# Patient Record
Sex: Female | Born: 1945 | Race: White | Hispanic: No | Marital: Married | Smoking: Never smoker
Health system: Southern US, Community
[De-identification: ages and names within clinical notes are randomized; demographics above are authoritative.]

## PROBLEM LIST (undated history)

## (undated) DIAGNOSIS — J189 Pneumonia, unspecified organism: Secondary | ICD-10-CM

## (undated) DIAGNOSIS — T4145XA Adverse effect of unspecified anesthetic, initial encounter: Secondary | ICD-10-CM

## (undated) DIAGNOSIS — Z8719 Personal history of other diseases of the digestive system: Secondary | ICD-10-CM

## (undated) DIAGNOSIS — E785 Hyperlipidemia, unspecified: Secondary | ICD-10-CM

## (undated) DIAGNOSIS — I10 Essential (primary) hypertension: Secondary | ICD-10-CM

## (undated) DIAGNOSIS — Z8489 Family history of other specified conditions: Secondary | ICD-10-CM

## (undated) DIAGNOSIS — M1990 Unspecified osteoarthritis, unspecified site: Secondary | ICD-10-CM

## (undated) DIAGNOSIS — H3530 Unspecified macular degeneration: Secondary | ICD-10-CM

## (undated) DIAGNOSIS — E119 Type 2 diabetes mellitus without complications: Secondary | ICD-10-CM

## (undated) DIAGNOSIS — J45909 Unspecified asthma, uncomplicated: Secondary | ICD-10-CM

## (undated) DIAGNOSIS — E039 Hypothyroidism, unspecified: Secondary | ICD-10-CM

## (undated) DIAGNOSIS — D649 Anemia, unspecified: Secondary | ICD-10-CM

## (undated) DIAGNOSIS — T8859XA Other complications of anesthesia, initial encounter: Secondary | ICD-10-CM

## (undated) DIAGNOSIS — G4730 Sleep apnea, unspecified: Secondary | ICD-10-CM

## (undated) DIAGNOSIS — R011 Cardiac murmur, unspecified: Secondary | ICD-10-CM

## (undated) HISTORY — PX: DILATION AND CURETTAGE OF UTERUS: SHX78

## (undated) HISTORY — PX: JOINT REPLACEMENT: SHX530

## (undated) HISTORY — PX: OTHER SURGICAL HISTORY: SHX169

## (undated) HISTORY — PX: PARTIAL HYSTERECTOMY: SHX80

## (undated) HISTORY — PX: EYE SURGERY: SHX253

## (undated) HISTORY — PX: TONSILLECTOMY: SUR1361

## (undated) HISTORY — PX: THYROIDECTOMY: SHX17

## (undated) HISTORY — PX: CHOLECYSTECTOMY: SHX55

## (undated) HISTORY — PX: CERVICAL SPINE SURGERY: SHX589

## (undated) HISTORY — PX: ABDOMINAL HYSTERECTOMY: SHX81

## (undated) HISTORY — PX: SHOULDER ARTHROSCOPY WITH OPEN ROTATOR CUFF REPAIR: SHX6092

## (undated) HISTORY — PX: KNEE CARTILAGE SURGERY: SHX688

---
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
## 1979-10-03 DIAGNOSIS — I739 Peripheral vascular disease, unspecified: Secondary | ICD-10-CM

## 1979-10-03 HISTORY — DX: Peripheral vascular disease, unspecified: I73.9

## 2003-10-03 DIAGNOSIS — E119 Type 2 diabetes mellitus without complications: Secondary | ICD-10-CM

## 2003-10-03 HISTORY — DX: Type 2 diabetes mellitus without complications: E11.9

## 2012-11-16 ENCOUNTER — Emergency Department
Admission: EM | Admit: 2012-11-16 | Discharge: 2012-11-16 | Disposition: A | Discharge status: Home / Self Care | Payer: MEDICARE | Source: Home / Self Care | Attending: Family Medicine | Admitting: Family Medicine

## 2012-11-16 ENCOUNTER — Encounter: Payer: Self-pay | Admitting: *Deleted

## 2012-11-16 DIAGNOSIS — J45909 Unspecified asthma, uncomplicated: Secondary | ICD-10-CM

## 2012-11-16 DIAGNOSIS — J069 Acute upper respiratory infection, unspecified: Secondary | ICD-10-CM

## 2012-11-16 HISTORY — DX: Hyperlipidemia, unspecified: E78.5

## 2012-11-16 HISTORY — DX: Essential (primary) hypertension: I10

## 2012-11-16 HISTORY — DX: Type 2 diabetes mellitus without complications: E11.9

## 2012-11-16 MED ORDER — ALBUTEROL SULFATE HFA 108 (90 BASE) MCG/ACT IN AERS: 2.0000 | INHALATION_SPRAY | RESPIRATORY_TRACT | Status: DC | PRN

## 2012-11-16 MED ORDER — BENZONATATE 200 MG PO CAPS: 200.0000 mg | ORAL_CAPSULE | Freq: Every day | ORAL | Status: DC

## 2012-11-16 MED ORDER — DOXYCYCLINE HYCLATE 100 MG PO CAPS: 100.0000 mg | ORAL_CAPSULE | Freq: Two times a day (BID) | ORAL | Status: DC

## 2012-11-16 MED ORDER — PREDNISONE 20 MG PO TABS: 20.0000 mg | ORAL_TABLET | Freq: Two times a day (BID) | ORAL | Status: DC

## 2012-11-16 NOTE — ED Provider Notes (Signed)
History     CSN: [id number]  Arrival date & time 11/16/12  1238   First MD Initiated Contact with Patient 11/16/12 1413      Chief Complaint  Patient presents with  . Cough       HPI Comments: Patient complains of one day history of non-productive cough, hoarseness, mild myalgias, and tightness in her anterior chest.  No fevers, chills, and sweats but she has felt hot.  She has a history of asthma.  The history is provided by the patient.    Past Medical History  Diagnosis Date  . Diabetes mellitus without complication   . Hypertension   . Hyperlipemia     Past Surgical History  Procedure Laterality Date  . Hystersalpingogram    . Dilation and curettage of uterus    . Partial hysterectomy    . Cholecystectomy      Family History  Problem Relation Age of Onset  . Diabetes Mother   . Cancer Mother   . Cancer Father   . Cancer Sister   . Diabetes Brother   . Cancer Brother     History  Substance Use Topics  . Smoking status: Never Smoker   . Smokeless tobacco: Not on file  . Alcohol Use: No    OB History   Grav Para Term Preterm Abortions TAB SAB Ect Mult Living                  Review of Systems No sore throat + cough No pleuritic pain No wheezing + nasal congestion + post-nasal drainage No sinus pain/pressure No itchy/red eyes No earache No hemoptysis No SOB No fever/chills but felt hot No nausea No vomiting No abdominal pain No diarrhea No urinary symptoms No skin rashes + fatigue + myalgias No headache Used OTC meds without relief  Allergies  Penicillins; Percodan; and Sulfa antibiotics  Home Medications   Current Outpatient Rx  Name  Route  Sig  Dispense  Refill  . aspirin 81 MG tablet   Oral   Take 81 mg by mouth daily.         . budesonide-formoterol (SYMBICORT) 80-4.5 MCG/ACT inhaler   Inhalation   Inhale 2 puffs into the lungs 2 (two) times daily.         . calcium-vitamin D 250-100 MG-UNIT per tablet   Oral    Take 1 tablet by mouth 2 (two) times daily.         . fenofibrate (TRICOR) 145 MG tablet   Oral   Take 145 mg by mouth daily.         . fish oil-omega-3 fatty acids 1000 MG capsule   Oral   Take 2 g by mouth daily.         . Flaxseed, Linseed, (FLAX SEED OIL) 1000 MG CAPS   Oral   Take by mouth.         . fluticasone (FLONASE) 50 MCG/ACT nasal spray   Nasal   Place 2 sprays into the nose daily.         Marland Kitchen levothyroxine (SYNTHROID, LEVOTHROID) 50 MCG tablet   Oral   Take 50 mcg by mouth daily.         . Liraglutide (VICTOZA) 18 MG/3ML SOLN injection   Subcutaneous   Inject 1.8 mg into the skin.         Marland Kitchen losartan-hydrochlorothiazide (HYZAAR) 100-25 MG per tablet   Oral   Take 1 tablet by mouth daily.         Marland Kitchen  metFORMIN (GLUCOPHAGE) 500 MG tablet   Oral   Take 500 mg by mouth 2 (two) times daily with a meal.         . montelukast (SINGULAIR) 10 MG tablet   Oral   Take 10 mg by mouth at bedtime.         . Multiple Vitamins-Minerals (CENTRUM SILVER PO)   Oral   Take by mouth.         . naproxen sodium (ANAPROX) 220 MG tablet   Oral   Take 220 mg by mouth 2 (two) times daily with a meal.         . vitamin C (ASCORBIC ACID) 500 MG tablet   Oral   Take 500 mg by mouth daily.         Marland Kitchen albuterol (PROVENTIL HFA;VENTOLIN HFA) 108 (90 BASE) MCG/ACT inhaler   Inhalation   Inhale 2 puffs into the lungs every 4 (four) hours as needed for wheezing.   1 Inhaler   0   . benzonatate (TESSALON) 200 MG capsule   Oral   Take 1 capsule (200 mg total) by mouth at bedtime. Take as needed for cough   12 capsule   0   . doxycycline (VIBRAMYCIN) 100 MG capsule   Oral   Take 1 capsule (100 mg total) by mouth 2 (two) times daily. (Rx void after 11/24/13)   20 capsule   0   . predniSONE (DELTASONE) 20 MG tablet   Oral   Take 1 tablet (20 mg total) by mouth 2 (two) times daily.   10 tablet   0     BP 155/84  Pulse 81  Temp(Src) 98.4 F (36.9 C)  (Oral)  Ht 5\' 2"  (1.575 m)  Wt 209 lb (94.802 kg)  BMI 38.22 kg/m2  SpO2 96%  Physical Exam Nursing notes and Vital Signs reviewed. Appearance:  Patient appears stated age, and in no acute distress.   Patient is obese (BMI 38.2) Eyes:  Pupils are equal, round, and reactive to light and accomodation.  Extraocular movement is intact.  Conjunctivae are not inflamed  Ears:  Canals normal.  Tympanic membranes normal.  Nose:  Mildly congested turbinates.  No sinus tenderness.   Pharynx:  Normal Neck:  Supple.  Slightly tender shotty posterior nodes are palpated bilaterally  Lungs:  Clear to auscultation.  Breath sounds are equal. Chest:  Distinct tenderness to palpation over the mid-sternum.   Heart:  Regular rate and rhythm without murmurs, rubs, or gallops.  Abdomen:  Nontender without masses or hepatosplenomegaly.  Bowel sounds are present.  No CVA or flank tenderness.  Extremities:  No edema.  No calf tenderness Skin:  No rash present.   ED Course  Procedures  none      1. Asthma, chronic   2. Acute upper respiratory infections of unspecified site; suspect early viral URI       MDM  There is no evidence of bacterial infection today.   Begin prednisone burst.  Rx for albuterol inhaler given.  Prescription written for Benzonatate [facility]) to take at bedtime for night-time cough.  Take plain Mucinex (guaifenesin) twice daily for cough and congestion.  Increase fluid intake, rest. May use Afrin nasal spray (or generic oxymetazoline) twice daily for about 5 days.  Also recommend using saline nasal spray several times daily and saline nasal irrigation (AYR is a common brand) Stop all antihistamines for now, and other non-prescription cough/cold preparations. Continue all inhalers Begin Doxycycline if not improving  about 5 days or if persistent fever develops (Given a prescription to hold, with an expiration date)  Follow-up with family doctor if not improving 7 to 10 days.          Lattie Haw, MD 11/16/12 [phone]

## 2012-11-16 NOTE — ED Notes (Signed)
Pt developed a dry cough, hoarse voice, chest congestion starting last night.

## 2013-09-01 ENCOUNTER — Emergency Department
Admission: EM | Admit: 2013-09-01 | Discharge: 2013-09-01 | Disposition: A | Discharge status: Home / Self Care | Payer: MEDICARE | Source: Home / Self Care | Attending: Family Medicine | Admitting: Family Medicine

## 2013-09-01 ENCOUNTER — Encounter: Payer: Self-pay | Admitting: Emergency Medicine

## 2013-09-01 DIAGNOSIS — R112 Nausea with vomiting, unspecified: Secondary | ICD-10-CM

## 2013-09-01 DIAGNOSIS — R197 Diarrhea, unspecified: Secondary | ICD-10-CM

## 2013-09-01 MED ORDER — ONDANSETRON 4 MG PO TBDP: 4.0000 mg | ORAL_TABLET | Freq: Three times a day (TID) | ORAL | Status: DC | PRN

## 2013-09-01 NOTE — ED Provider Notes (Signed)
CSN: [id number]     Arrival date & time 09/01/13  1853 History   First MD Initiated Contact with Patient 09/01/13 1925     Chief Complaint  Patient presents with  . Diarrhea  . Emesis      HPI Comments: Patient awoke at 6:30AM today with fever/fatigue/myalgias and diarrhea.  She has had nausea and two episodes of vomiting.  No abdominal pain.  She is diabetic and her CBG earlier was 175.  Her husband has similar symptoms.  Denies recent foreign travel, or drinking untreated water in a wilderness environment.   Patient is a 67 y.o. female presenting with diarrhea. The history is provided by the patient and the spouse.  Diarrhea Quality:  Watery Severity:  Moderate Onset quality:  Sudden Duration:  12 hours Timing:  Intermittent Progression:  Unchanged Relieved by:  Nothing Worsened by:  Nothing tried Ineffective treatments:  None tried Associated symptoms: chills, fever, headaches, myalgias and vomiting   Associated symptoms: no abdominal pain, no arthralgias, no recent cough, no diaphoresis and no URI   Risk factors: sick contacts   Risk factors: no recent antibiotic use, no suspicious food intake and no travel to endemic areas     Past Medical History  Diagnosis Date  . Diabetes mellitus without complication   . Hypertension   . Hyperlipemia    Past Surgical History  Procedure Laterality Date  . Hystersalpingogram    . Dilation and curettage of uterus    . Partial hysterectomy    . Cholecystectomy     Family History  Problem Relation Age of Onset  . Diabetes Mother   . Cancer Mother   . Cancer Father   . Cancer Sister   . Diabetes Brother   . Cancer Brother    History  Substance Use Topics  . Smoking status: Never Smoker   . Smokeless tobacco: Never Used  . Alcohol Use: No   OB History   Grav Para Term Preterm Abortions TAB SAB Ect Mult Living                 Review of Systems  Constitutional: Positive for fever and chills. Negative for diaphoresis.   Gastrointestinal: Positive for vomiting and diarrhea. Negative for abdominal pain.  Musculoskeletal: Positive for myalgias. Negative for arthralgias.  Neurological: Positive for headaches.  All other systems reviewed and are negative.    Allergies  Penicillins; Percodan; and Sulfa antibiotics  Home Medications   Current Outpatient Rx  Name  Route  Sig  Dispense  Refill  . albuterol (PROVENTIL HFA;VENTOLIN HFA) 108 (90 BASE) MCG/ACT inhaler   Inhalation   Inhale 2 puffs into the lungs every 4 (four) hours as needed for wheezing.   1 Inhaler   0   . aspirin 81 MG tablet   Oral   Take 81 mg by mouth daily.         . benzonatate (TESSALON) 200 MG capsule   Oral   Take 1 capsule (200 mg total) by mouth at bedtime. Take as needed for cough   12 capsule   0   . budesonide-formoterol (SYMBICORT) 80-4.5 MCG/ACT inhaler   Inhalation   Inhale 2 puffs into the lungs 2 (two) times daily.         . calcium-vitamin D 250-100 MG-UNIT per tablet   Oral   Take 1 tablet by mouth 2 (two) times daily.         Marland Kitchen doxycycline (VIBRAMYCIN) 100 MG capsule   Oral  Take 1 capsule (100 mg total) by mouth 2 (two) times daily. (Rx void after 11/24/13)   20 capsule   0   . fenofibrate (TRICOR) 145 MG tablet   Oral   Take 145 mg by mouth daily.         . fish oil-omega-3 fatty acids 1000 MG capsule   Oral   Take 2 g by mouth daily.         . Flaxseed, Linseed, (FLAX SEED OIL) 1000 MG CAPS   Oral   Take by mouth.         . fluticasone (FLONASE) 50 MCG/ACT nasal spray   Nasal   Place 2 sprays into the nose daily.         Marland Kitchen levothyroxine (SYNTHROID, LEVOTHROID) 50 MCG tablet   Oral   Take 50 mcg by mouth daily.         . Liraglutide (VICTOZA) 18 MG/3ML SOLN injection   Subcutaneous   Inject 1.8 mg into the skin.         Marland Kitchen losartan-hydrochlorothiazide (HYZAAR) 100-25 MG per tablet   Oral   Take 1 tablet by mouth daily.         . metFORMIN (GLUCOPHAGE) 500 MG  tablet   Oral   Take 500 mg by mouth 2 (two) times daily with a meal.         . montelukast (SINGULAIR) 10 MG tablet   Oral   Take 10 mg by mouth at bedtime.         . Multiple Vitamins-Minerals (CENTRUM SILVER PO)   Oral   Take by mouth.         . naproxen sodium (ANAPROX) 220 MG tablet   Oral   Take 220 mg by mouth 2 (two) times daily with a meal.         . ondansetron (ZOFRAN ODT) 4 MG disintegrating tablet   Oral   Take 1 tablet (4 mg total) by mouth every 8 (eight) hours as needed for nausea or vomiting.   12 tablet   0   . predniSONE (DELTASONE) 20 MG tablet   Oral   Take 1 tablet (20 mg total) by mouth 2 (two) times daily.   10 tablet   0   . vitamin C (ASCORBIC ACID) 500 MG tablet   Oral   Take 500 mg by mouth daily.          BP 128/70  Pulse 105  Temp(Src) 101.4 F (38.6 C) (Oral)  Resp 18  Wt 211 lb (95.709 kg)  SpO2 96% Physical Exam Nursing notes and Vital Signs reviewed. Appearance:  Patient appears stated age, and in no acute distress.  She is obese. Eyes:  Pupils are equal, round, and reactive to light and accomodation.  Extraocular movement is intact.  Conjunctivae are not inflamed  Ears:  Canals normal.  Tympanic membranes normal.  Nose:   Normal turbinates.  No sinus tenderness.   Pharynx:  Normal;  moist mucous membranes  Neck:  Supple.   No adenopathy Lungs:  Clear to auscultation.  Breath sounds are equal.  Heart:  Regular rate and rhythm without murmurs, rubs, or gallops.  Abdomen:  Nontender without masses or hepatosplenomegaly.  Bowel sounds are present.  No CVA or flank tenderness.  Extremities:  No edema.  No calf tenderness Skin:  No rash present.   ED Course  Procedures  none    Labs Reviewed      POCT Glucose [facility])  145 (*)              MDM   1. Nausea with vomiting; suspect a viral gastroenteritis   2. Diarrhea    Rx for Zofran. Begin clear liquids (use an adult oral rehydration solution while having  diarrhea) until improved, then advance diet.  Then gradually resume a regular diet when tolerated.  Avoid milk products until well.  To decrease diarrhea, mix one heaping tablespoon Citrucel (methylcellulose) in 8 oz water and drink one to three times daily.  When stools become more formed, may take Imodium (loperamide) once or twice daily to decrease stool frequency.  Check blood glucose more often and adjust medications as necessary. If symptoms become significantly worse during the night or over the weekend, proceed to the local emergency room.     Lattie Haw, MD 09/06/13 [phone]

## 2013-09-01 NOTE — ED Notes (Signed)
Cheryl Finley is here with her husband both with N/V/D. She also has a fever and c/o body aches. She is unable to hold down food and minimal fluids. Symptoms x 14 hours. She is a diabetic. CBG earlier today 175,  Checked in-house, 145.

## 2013-09-07 ENCOUNTER — Telehealth: Payer: Self-pay

## 2013-09-07 NOTE — ED Notes (Signed)
I called and spoke with patient and she is doing better. I advised to call back if anything changes or if she has questions or concerns.  

## 2013-09-18 ENCOUNTER — Emergency Department
Admission: EM | Admit: 2013-09-18 | Discharge: 2013-09-18 | Disposition: A | Discharge status: Home / Self Care | Payer: MEDICARE | Source: Home / Self Care | Attending: Family Medicine | Admitting: Family Medicine

## 2013-09-18 ENCOUNTER — Encounter: Payer: Self-pay | Admitting: Emergency Medicine

## 2013-09-18 DIAGNOSIS — J069 Acute upper respiratory infection, unspecified: Secondary | ICD-10-CM

## 2013-09-18 DIAGNOSIS — J40 Bronchitis, not specified as acute or chronic: Secondary | ICD-10-CM

## 2013-09-18 DIAGNOSIS — R062 Wheezing: Secondary | ICD-10-CM

## 2013-09-18 MED ORDER — AZITHROMYCIN 250 MG PO TABS: ORAL_TABLET | ORAL | Status: DC

## 2013-09-18 MED ORDER — METHYLPREDNISOLONE SODIUM SUCC 125 MG IJ SOLR
125.0000 mg | Freq: Once | INTRAMUSCULAR | Status: AC
  Administered 2013-09-18: 125 mg via INTRAMUSCULAR

## 2013-09-18 MED ORDER — PREDNISONE 50 MG PO TABS: ORAL_TABLET | ORAL | Status: DC

## 2013-09-18 NOTE — ED Notes (Signed)
Mirel c/o dry cough without fever x yesterday. She reports hx of asthmatic bronchitis and sleep apnea with CPAP use. Allergic to flu vac but has had PNA vac.

## 2013-09-18 NOTE — ED Provider Notes (Signed)
CSN: [id number]     Arrival date & time 09/18/13  1308 History   First MD Initiated Contact with Patient 09/18/13 1002     Chief Complaint  Patient presents with  . Cough    HPI  URI Symptoms Onset: 3-4 days  Description: rhinorrhea, cough, wheezing  Modifying factors:  Baseline hx/o asthma. Out of town from [place] to see newborn grandchild. Born @ 33 weeks. Came out NICU this week. Pt wants to get ahead of potential bronchitic flare.   Symptoms Nasal discharge: yes Fever: no Sore throat: no Cough: yes Wheezing: yes Ear pain: no GI symptoms: no Sick contacts: yes; multiple  Red Flags  Stiff neck: no Dyspnea: no Rash: no Swallowing difficulty: no  Sinusitis Risk Factors Headache/face pain: no Double sickening: no tooth pain: no  Allergy Risk Factors Sneezing: no Itchy scratchy throat: no Seasonal symptoms: no  Flu Risk Factors Headache: no muscle aches: no severe fatigue: no   Past Medical History  Diagnosis Date  . Diabetes mellitus without complication   . Hypertension   . Hyperlipemia    Past Surgical History  Procedure Laterality Date  . Hystersalpingogram    . Dilation and curettage of uterus    . Partial hysterectomy    . Cholecystectomy     Family History  Problem Relation Age of Onset  . Diabetes Mother   . Cancer Mother   . Cancer Father   . Cancer Sister   . Diabetes Brother   . Cancer Brother    History  Substance Use Topics  . Smoking status: Never Smoker   . Smokeless tobacco: Never Used  . Alcohol Use: No   OB History   Grav Para Term Preterm Abortions TAB SAB Ect Mult Living                 Review of Systems  All other systems reviewed and are negative.    Allergies  Penicillins; Percodan; and Sulfa antibiotics  Home Medications   Current Outpatient Rx  Name  Route  Sig  Dispense  Refill  . aspirin 81 MG tablet   Oral   Take 81 mg by mouth daily.         Marland Kitchen azithromycin (ZITHROMAX) 250 MG tablet      Take 2  tabs PO x 1 dose, then 1 tab PO QD x 4 days   6 tablet   0   . budesonide-formoterol (SYMBICORT) 80-4.5 MCG/ACT inhaler   Inhalation   Inhale 2 puffs into the lungs 2 (two) times daily.         . calcium-vitamin D 250-100 MG-UNIT per tablet   Oral   Take 1 tablet by mouth 2 (two) times daily.         . fenofibrate (TRICOR) 145 MG tablet   Oral   Take 145 mg by mouth daily.         . fish oil-omega-3 fatty acids 1000 MG capsule   Oral   Take 2 g by mouth daily.         . Flaxseed, Linseed, (FLAX SEED OIL) 1000 MG CAPS   Oral   Take by mouth.         . fluticasone (FLONASE) 50 MCG/ACT nasal spray   Nasal   Place 2 sprays into the nose daily.         Marland Kitchen levothyroxine (SYNTHROID, LEVOTHROID) 50 MCG tablet   Oral   Take 50 mcg by mouth daily.         Marland Kitchen  Liraglutide (VICTOZA) 18 MG/3ML SOLN injection   Subcutaneous   Inject 1.8 mg into the skin.         Marland Kitchen losartan-hydrochlorothiazide (HYZAAR) 100-25 MG per tablet   Oral   Take 1 tablet by mouth daily.         . metFORMIN (GLUCOPHAGE) 500 MG tablet   Oral   Take 500 mg by mouth 2 (two) times daily with a meal.         . montelukast (SINGULAIR) 10 MG tablet   Oral   Take 10 mg by mouth at bedtime.         . Multiple Vitamins-Minerals (CENTRUM SILVER PO)   Oral   Take by mouth.         . naproxen sodium (ANAPROX) 220 MG tablet   Oral   Take 220 mg by mouth 2 (two) times daily with a meal.         . predniSONE (DELTASONE) 50 MG tablet      1 tab daily x 5 days   5 tablet   0   . vitamin C (ASCORBIC ACID) 500 MG tablet   Oral   Take 500 mg by mouth daily.          BP 155/67  Pulse 89  Temp(Src) 98 F (36.7 C) (Oral)  Resp 16  Wt 211 lb (95.709 kg)  SpO2 98% Physical Exam  Constitutional: She is oriented to person, place, and time. She appears well-developed and well-nourished.  HENT:  Head: Normocephalic and atraumatic.  Right Ear: External ear normal.  Left Ear: External  ear normal.  +nasal erythema, rhinorrhea bilaterally, + post oropharyngeal erythema    Eyes: Conjunctivae are normal. Pupils are equal, round, and reactive to light.  Neck: Normal range of motion. Neck supple.  Cardiovascular: Normal rate and regular rhythm.   Pulmonary/Chest: Effort normal. She has wheezes.  Abdominal: Soft.  Musculoskeletal: Normal range of motion.  Neurological: She is alert and oriented to person, place, and time.  Skin: Skin is warm.    ED Course  Procedures (including critical care time) Labs Review Labs Reviewed - No data to display Imaging Review No results found.  EKG Interpretation    Date/Time:    Ventricular Rate:    PR Interval:    QRS Duration:   QT Interval:    QTC Calculation:   R Axis:     Text Interpretation:              MDM   1. URI (upper respiratory infection)   2. Bronchitis   3. Wheezing    Likely viral induced bronchitis with mild asthma flare  Overall resp exam reassuring Solumedrol 125mg  Im x1 Will place on short course of prednisone PPx course of zpak if sxs fail to improve/worsen Discussed general sick care and direct of newborn at a minimum if at all.  Discussed resp red flags.  Follow up as needed.     The patient and/or caregiver has been counseled thoroughly with regard to treatment plan and/or medications prescribed including dosage, schedule, interactions, rationale for use, and possible side effects and they verbalize understanding. Diagnoses and expected course of recovery discussed and will return if not improved as expected or if the condition worsens. Patient and/or caregiver verbalized understanding.         Doree Albee, MD 09/18/13 [phone]

## 2013-09-20 ENCOUNTER — Telehealth: Payer: Self-pay

## 2013-09-20 NOTE — ED Notes (Signed)
I called and spoke with patient and she is doing better. I advised to call back if anything changes or if she has questions or concerns.  

## 2013-09-21 ENCOUNTER — Encounter: Payer: Self-pay | Admitting: Emergency Medicine

## 2013-09-21 ENCOUNTER — Emergency Department
Admission: EM | Admit: 2013-09-21 | Discharge: 2013-09-21 | Disposition: A | Discharge status: Home / Self Care | Payer: MEDICARE | Source: Home / Self Care | Attending: Family Medicine | Admitting: Family Medicine

## 2013-09-21 DIAGNOSIS — J45909 Unspecified asthma, uncomplicated: Secondary | ICD-10-CM

## 2013-09-21 MED ORDER — BENZONATATE 200 MG PO CAPS: 200.0000 mg | ORAL_CAPSULE | Freq: Every day | ORAL | Status: DC

## 2013-09-21 MED ORDER — PREDNISONE 20 MG PO TABS: 20.0000 mg | ORAL_TABLET | Freq: Two times a day (BID) | ORAL | Status: DC

## 2013-09-21 MED ORDER — DOXYCYCLINE HYCLATE 100 MG PO CAPS: 100.0000 mg | ORAL_CAPSULE | Freq: Two times a day (BID) | ORAL | Status: DC

## 2013-09-21 NOTE — ED Provider Notes (Signed)
CSN: [id number]     Arrival date & time 09/21/13  1224 History   First MD Initiated Contact with Patient 09/21/13 1342     Chief Complaint  Patient presents with  . Cough  . Nasal Congestion     HPI Comments: The patient was seen here 3 days ago with a viral URI and asthmatic flare.  She was treated with Solumedrol injection, prednisone burst, and Z-pack.  She complains of continued sore throat, sinus congestion, cough, shortness of breath and icreasd wheezing.  She has had sweats the past two days but no fever.  She has been using albuterol in her nebulizer at home.  She continues Symbicort. She denies pleuritic pain   The history is provided by the patient and the spouse.    Past Medical History  Diagnosis Date  . Diabetes mellitus without complication   . Hypertension   . Hyperlipemia    Past Surgical History  Procedure Laterality Date  . Hystersalpingogram    . Dilation and curettage of uterus    . Partial hysterectomy    . Cholecystectomy     Family History  Problem Relation Age of Onset  . Diabetes Mother   . Cancer Mother   . Cancer Father   . Cancer Sister   . Diabetes Brother   . Cancer Brother    History  Substance Use Topics  . Smoking status: Never Smoker   . Smokeless tobacco: Never Used  . Alcohol Use: No   OB History   Grav Para Term Preterm Abortions TAB SAB Ect Mult Living                 Review of Systems + sore throat + cough No pleuritic pain + wheezing + nasal congestion + post-nasal drainage No sinus pain/pressure No itchy/red eyes No earache No hemoptysis + SOB No fever/chills, +sweats No nausea No vomiting No abdominal pain No diarrhea No urinary symptoms No skin rash + fatigue No myalgias No headache    Allergies  Penicillins; Percodan; and Sulfa antibiotics  Home Medications   Current Outpatient Rx  Name  Route  Sig  Dispense  Refill  . aspirin 81 MG tablet   Oral   Take 81 mg by mouth daily.         Marland Kitchen  azithromycin (ZITHROMAX) 250 MG tablet      Take 2 tabs PO x 1 dose, then 1 tab PO QD x 4 days   6 tablet   0   . benzonatate (TESSALON) 200 MG capsule   Oral   Take 1 capsule (200 mg total) by mouth at bedtime. Take as needed for cough   12 capsule   0   . budesonide-formoterol (SYMBICORT) 80-4.5 MCG/ACT inhaler   Inhalation   Inhale 2 puffs into the lungs 2 (two) times daily.         . calcium-vitamin D 250-100 MG-UNIT per tablet   Oral   Take 1 tablet by mouth 2 (two) times daily.         Marland Kitchen doxycycline (VIBRAMYCIN) 100 MG capsule   Oral   Take 1 capsule (100 mg total) by mouth 2 (two) times daily.   20 capsule   0   . fenofibrate (TRICOR) 145 MG tablet   Oral   Take 145 mg by mouth daily.         . fish oil-omega-3 fatty acids 1000 MG capsule   Oral   Take 2 g by mouth daily.         Marland Kitchen  Flaxseed, Linseed, (FLAX SEED OIL) 1000 MG CAPS   Oral   Take by mouth.         . fluticasone (FLONASE) 50 MCG/ACT nasal spray   Nasal   Place 2 sprays into the nose daily.         Marland Kitchen levothyroxine (SYNTHROID, LEVOTHROID) 50 MCG tablet   Oral   Take 50 mcg by mouth daily.         . Liraglutide (VICTOZA) 18 MG/3ML SOLN injection   Subcutaneous   Inject 1.8 mg into the skin.         Marland Kitchen losartan-hydrochlorothiazide (HYZAAR) 100-25 MG per tablet   Oral   Take 1 tablet by mouth daily.         . metFORMIN (GLUCOPHAGE) 500 MG tablet   Oral   Take 500 mg by mouth 2 (two) times daily with a meal.         . montelukast (SINGULAIR) 10 MG tablet   Oral   Take 10 mg by mouth at bedtime.         . Multiple Vitamins-Minerals (CENTRUM SILVER PO)   Oral   Take by mouth.         . naproxen sodium (ANAPROX) 220 MG tablet   Oral   Take 220 mg by mouth 2 (two) times daily with a meal.         . predniSONE (DELTASONE) 20 MG tablet   Oral   Take 1 tablet (20 mg total) by mouth 2 (two) times daily.   10 tablet   0   . vitamin C (ASCORBIC ACID) 500 MG  tablet   Oral   Take 500 mg by mouth daily.          BP 175/79  Pulse 86  Temp(Src) 98.7 F (37.1 C) (Oral)  Ht 5\' 2"  (1.575 m)  Wt 211 lb 12 oz (96.049 kg)  BMI 38.72 kg/m2  SpO2 96% Physical Exam Nursing notes and Vital Signs reviewed. Appearance:  Patient appears stated age, and in no acute distress. Patient is obese (BMI 38.7) Eyes:  Pupils are equal, round, and reactive to light and accomodation.  Extraocular movement is intact.  Conjunctivae are not inflamed  Ears:  Canals normal.  Tympanic membranes normal.  Nose:  Congested turbinates.  No sinus tenderness.    Pharynx:  Normal Neck:  Supple.  Tender shotty posterior nodes are palpated bilaterally  Lungs:  Diffuse wheezes posteriorly.  Breath sounds are equal. No respiratory distress Heart:  Regular rate and rhythm without murmurs, rubs, or gallops.  Abdomen:  Nontender without masses or hepatosplenomegaly.  Bowel sounds are present.  No CVA or flank tenderness.  Extremities:  No edema.  No calf tenderness Skin:  No rash present.   ED Course  Procedures  none         MDM   1. Asthmatic bronchitis, induced by viral URI.    Begin prednisone burst.  Add doxycycline.  Prescription written for Benzonatate [facility]) to take at bedtime for night-time cough.  Take plain Mucinex (1200 mg guaifenesin) twice daily for cough and congestion.  Increase fluid intake, rest. May use Afrin nasal spray (or generic oxymetazoline) twice daily for about 5 days.  Also recommend using saline nasal spray several times daily and saline nasal irrigation (AYR is a common brand) Try warm salt water gargles for sore throat.  Stop all antihistamines for now, and other non-prescription cough/cold preparations. Continue albuterol by nebulizer as needed, and Symbicort. Recommend  establishing an "asthma plan" upon follow-up with PCP If symptoms become significantly worse during the night or over the weekend, proceed to the local emergency  room.    Lattie Haw, MD 09/22/13 [phone]

## 2013-09-21 NOTE — ED Notes (Signed)
Patient here for f/u from 09/18/13. Patient states she still feels the same.

## 2015-10-03 DIAGNOSIS — C801 Malignant (primary) neoplasm, unspecified: Secondary | ICD-10-CM

## 2015-10-03 HISTORY — DX: Malignant (primary) neoplasm, unspecified: C80.1

## 2015-10-03 HISTORY — PX: BREAST SURGERY: SHX581

## 2019-08-01 ENCOUNTER — Other Ambulatory Visit: Payer: Self-pay | Admitting: Orthopedic Surgery

## 2019-08-15 ENCOUNTER — Encounter (HOSPITAL_COMMUNITY): Payer: Self-pay

## 2019-08-15 NOTE — Pre-Procedure Instructions (Addendum)
[place], [place] - [address] [place] [address] Phone: [phone] Fax: [phone]  [facility] [address] - [place], [place] - [address] [place] [place] [address] Phone: [phone] Fax: [phone]      Your procedure is scheduled on  08-20-19 from 0830 AM-1235pm  Report to [place] Entrance "A" at 0630A.M., and check in at the Admitting office.  Call this number if you have problems the morning of surgery:  [phone]  Call [phone] if you have any questions prior to your surgery date Monday-Friday 8am-4pm    Remember:  Do not eat after midnight the night before your surgery  You may drink clear liquids until 0530 AM the morning of your surgery.   Clear liquids allowed are: Water, Non-Citrus Juices (without pulp), Carbonated Beverages, Clear Tea, Black Coffee Only, and Gatorade.  Please complete your PRE-SURGERY G2 that was provided to you by ... the morning of surgery.  Please, if able, drink it in one setting. DO NOT SIP.   Take these medicines the morning of surgery with A SIP OF WATER: amLODipine (NORVASC) budesonide-formoterol (SYMBICORT)  levothyroxine (SYNTHROID)    7 days prior to surgery STOP taking any Aspirin (unless otherwise instructed by your surgeon), Aleve, Naproxen, Ibuprofen, Motrin, Advil, Goody's, BC's, all herbal medications, fish oil, and all vitamins.   WHAT DO I DO ABOUT MY DIABETES MEDICATION?  . (Amaryl) Glimepiride-Day before surgery do not take evening dose. Day of surgery do not take. Milana Obey before surgery-take 15U. Day of surgery take 15U. Marland Kitchen Humalog-Take as needed before meals, day before surgery. Do not take bedtime dose. Do not take day of surgery. .  . If your CBG is greater than 220 mg/dL, you may take  of your sliding scale (correction) dose of insulin.   How to Manage Your  Diabetes Before and After Surgery  Why is it important to control my blood sugar before and after surgery? . Improving blood sugar levels before and after surgery helps healing and can limit problems. . A way of improving blood sugar control is eating a healthy diet by: o  Eating less sugar and carbohydrates o  Increasing activity/exercise o  Talking with your doctor about reaching your blood sugar goals . High blood sugars (greater than 180 mg/dL) can raise your risk of infections and slow your recovery, so you will need to focus on controlling your diabetes during the weeks before surgery. . Make sure that the doctor who takes care of your diabetes knows about your planned surgery including the date and location.  How do I manage my blood sugar before surgery? . Check your blood sugar at least 4 times a day, starting 2 days before surgery, to make sure that the level is not too high or low. o Check your blood sugar the morning of your surgery when you wake up and every 2 hours until you get to the Short Stay unit. . If your blood sugar is less than 70 mg/dL, you will need to treat for low blood sugar: o Do not take insulin. o Treat a low blood sugar (less than 70 mg/dL) with  cup of clear juice (cranberry or apple), 4 glucose tablets, OR glucose gel. o Recheck blood sugar in 15 minutes after treatment (to make sure it is greater than 70 mg/dL). If your blood sugar is not  greater than 70 mg/dL on recheck, call [phone] for further instructions. . Report your blood sugar to the short stay nurse when you get to Short Stay.  . If you are admitted to the hospital after surgery: o Your blood sugar will be checked by the staff and you will probably be given insulin after surgery (instead of oral diabetes medicines) to make sure you have good blood sugar levels. o The goal for blood sugar control after surgery is 80-180 mg/dL.   The Morning of Surgery  Do not wear jewelry, make-up or nail  polish.  Do not wear lotions, powders, or perfumes/colognes, or deodorant  Do not shave 48 hours prior to surgery.    Do not bring valuables to the hospital.  [facility] is not responsible for any belongings or valuables.  If you are a smoker, DO NOT Smoke 24 hours prior to surgery IF you wear a CPAP at night please bring your mask, tubing, and machine the morning of surgery   Remember that you must have someone to transport you home after your surgery, and remain with you for 24 hours if you are discharged the same day.   Contacts, glasses, hearing aids, dentures or bridgework may not be worn into surgery.    Leave your suitcase in the car.  After surgery it may be brought to your room.  For patients admitted to the hospital, discharge time will be determined by your treatment team.  Patients discharged the day of surgery will not be allowed to drive home.    Special instructions:   - Preparing For Surgery  Before surgery, you can play an important role. Because skin is not sterile, your skin needs to be as free of germs as possible. You can reduce the number of germs on your skin by washing with CHG (chlorahexidine gluconate) Soap before surgery.  CHG is an antiseptic cleaner which kills germs and bonds with the skin to continue killing germs even after washing.    Oral Hygiene is also important to reduce your risk of infection.  Remember - BRUSH YOUR TEETH THE MORNING OF SURGERY WITH YOUR REGULAR TOOTHPASTE  Please do not use if you have an allergy to CHG or antibacterial soaps. If your skin becomes reddened/irritated stop using the CHG.  Do not shave (including legs and underarms) for at least 48 hours prior to first CHG shower. It is OK to shave your face.  Please follow these instructions carefully.   1. Shower the NIGHT BEFORE SURGERY and the MORNING OF SURGERY with CHG Soap.   2. If you chose to wash your hair, wash your hair first as usual with your normal  shampoo.  3. After you shampoo, rinse your hair and body thoroughly to remove the shampoo.  4. Use CHG as you would any other liquid soap. You can apply CHG directly to the skin and wash gently with a scrungie or a clean washcloth.   5. Apply the CHG Soap to your body ONLY FROM THE NECK DOWN.  Do not use on open wounds or open sores. Avoid contact with your eyes, ears, mouth and genitals (private parts). Wash Face and genitals (private parts)  with your normal soap.   6. Wash thoroughly, paying special attention to the area where your surgery will be performed.  7. Thoroughly rinse your body with warm water from the neck down.  8. DO NOT shower/wash with your normal soap after using and rinsing off the CHG Soap.  9. Pat yourself dry with a CLEAN TOWEL.  10. Wear CLEAN PAJAMAS to bed the night before surgery, wear comfortable clothes the morning of surgery  11. Place CLEAN SHEETS on your bed the night of your first shower and DO NOT SLEEP WITH PETS.    Day of Surgery:  Do not apply any deodorants/lotions. Please shower the morning of surgery with the CHG soap  Please wear clean clothes to the hospital/surgery center.   Remember to brush your teeth WITH YOUR REGULAR TOOTHPASTE.   Please read over the following fact sheets that you were given.

## 2019-08-18 ENCOUNTER — Encounter (HOSPITAL_COMMUNITY)
Admission: RE | Admit: 2019-08-18 | Discharge: 2019-08-18 | Disposition: A | Discharge status: Home / Self Care | Payer: Medicare Other | Source: Ambulatory Visit | Attending: Orthopedic Surgery | Admitting: Orthopedic Surgery

## 2019-08-18 ENCOUNTER — Other Ambulatory Visit: Payer: Self-pay

## 2019-08-18 ENCOUNTER — Other Ambulatory Visit (HOSPITAL_COMMUNITY)
Admission: RE | Admit: 2019-08-18 | Discharge: 2019-08-18 | Disposition: A | Discharge status: Home / Self Care | Payer: Medicare Other | Source: Ambulatory Visit | Attending: Orthopedic Surgery | Admitting: Orthopedic Surgery

## 2019-08-18 ENCOUNTER — Encounter (HOSPITAL_COMMUNITY): Payer: Self-pay

## 2019-08-18 DIAGNOSIS — Z20828 Contact with and (suspected) exposure to other viral communicable diseases: Secondary | ICD-10-CM | POA: Insufficient documentation

## 2019-08-18 DIAGNOSIS — Z01818 Encounter for other preprocedural examination: Secondary | ICD-10-CM | POA: Insufficient documentation

## 2019-08-18 DIAGNOSIS — M48061 Spinal stenosis, lumbar region without neurogenic claudication: Secondary | ICD-10-CM | POA: Insufficient documentation

## 2019-08-18 DIAGNOSIS — M5126 Other intervertebral disc displacement, lumbar region: Secondary | ICD-10-CM | POA: Insufficient documentation

## 2019-08-18 HISTORY — DX: Anemia, unspecified: D64.9

## 2019-08-18 HISTORY — DX: Family history of other specified conditions: Z84.89

## 2019-08-18 HISTORY — DX: Pneumonia, unspecified organism: J18.9

## 2019-08-18 HISTORY — DX: Unspecified asthma, uncomplicated: J45.909

## 2019-08-18 HISTORY — DX: Personal history of other diseases of the digestive system: Z87.19

## 2019-08-18 HISTORY — DX: Hypothyroidism, unspecified: E03.9

## 2019-08-18 HISTORY — DX: Unspecified osteoarthritis, unspecified site: M19.90

## 2019-08-18 HISTORY — DX: Other complications of anesthesia, initial encounter: T88.59XA

## 2019-08-18 HISTORY — DX: Sleep apnea, unspecified: G47.30

## 2019-08-18 HISTORY — DX: Cardiac murmur, unspecified: R01.1

## 2019-08-18 LAB — TYPE AND SCREEN
ABO/RH(D): O POS
Antibody Screen: NEGATIVE

## 2019-08-18 LAB — COMPREHENSIVE METABOLIC PANEL
ALT: 39 U/L (ref 0–44)
AST: 42 U/L — ABNORMAL HIGH (ref 15–41)
Albumin: 4.5 g/dL (ref 3.5–5.0)
Alkaline Phosphatase: 51 U/L (ref 38–126)
Anion gap: 9 (ref 5–15)
BUN: 13 mg/dL (ref 8–23)
CO2: 26 mmol/L (ref 22–32)
Calcium: 10.3 mg/dL (ref 8.9–10.3)
Chloride: 106 mmol/L (ref 98–111)
Creatinine, Ser: 0.72 mg/dL (ref 0.44–1.00)
GFR calc Af Amer: >60 mL/min (ref >60)
GFR calc non Af Amer: >60 mL/min (ref >60)
Glucose, Bld: 139 mg/dL — ABNORMAL HIGH (ref 70–99)
Potassium: 4.7 mmol/L (ref 3.5–5.1)
Sodium: 141 mmol/L (ref 135–145)
Total Bilirubin: 0.4 mg/dL (ref 0.3–1.2)
Total Protein: 7.6 g/dL (ref 6.5–8.1)

## 2019-08-18 LAB — CBC WITH DIFFERENTIAL/PLATELET
Abs Immature Granulocytes: 0.06 10*3/uL (ref 0.00–0.07)
Basophils Absolute: 0.1 10*3/uL (ref 0.0–0.1)
Basophils Relative: 1 %
Eosinophils Absolute: 0.1 10*3/uL (ref 0.0–0.5)
Eosinophils Relative: 2 %
HCT: 41.3 % (ref 36.0–46.0)
Hemoglobin: 12.8 g/dL (ref 12.0–15.0)
Immature Granulocytes: 1 %
Lymphocytes Relative: 28 %
Lymphs Abs: 2.3 10*3/uL (ref 0.7–4.0)
MCH: 28 pg (ref 26.0–34.0)
MCHC: 31 g/dL (ref 30.0–36.0)
MCV: 90.4 fL (ref 80.0–100.0)
Monocytes Absolute: 0.6 10*3/uL (ref 0.1–1.0)
Monocytes Relative: 7 %
Neutro Abs: 5.1 10*3/uL (ref 1.7–7.7)
Neutrophils Relative %: 61 %
Platelets: 293 10*3/uL (ref 150–400)
RBC: 4.57 MIL/uL (ref 3.87–5.11)
RDW: 15.9 % — ABNORMAL HIGH (ref 11.5–15.5)
WBC: 8.2 10*3/uL (ref 4.0–10.5)
nRBC: 0 % (ref 0.0–0.2)

## 2019-08-18 LAB — URINALYSIS, ROUTINE W REFLEX MICROSCOPIC
Bilirubin Urine: NEGATIVE
Glucose, UA: NEGATIVE mg/dL
Hgb urine dipstick: NEGATIVE
Ketones, ur: NEGATIVE mg/dL
Leukocytes,Ua: NEGATIVE
Nitrite: NEGATIVE
Protein, ur: NEGATIVE mg/dL
Specific Gravity, Urine: 1.016 (ref 1.005–1.030)
pH: 5 (ref 5.0–8.0)

## 2019-08-18 LAB — SARS CORONAVIRUS 2 (TAT 6-24 HRS): SARS Coronavirus 2: NEGATIVE

## 2019-08-18 LAB — PROTIME-INR
INR: 0.9 (ref 0.8–1.2)
Prothrombin Time: 12.5 seconds (ref 11.4–15.2)

## 2019-08-18 LAB — APTT: aPTT: 48 seconds — ABNORMAL HIGH (ref 24–36)

## 2019-08-18 LAB — GLUCOSE, CAPILLARY: Glucose-Capillary: 227 mg/dL — ABNORMAL HIGH (ref 70–99)

## 2019-08-18 LAB — SURGICAL PCR SCREEN
MRSA, PCR: NEGATIVE
Staphylococcus aureus: NEGATIVE

## 2019-08-18 NOTE — Progress Notes (Signed)
PCP - Doyle Askew- [facility] Cardiologist - Archie Elonda Husky- [facility]  PPM/ICD - n/a Device Orders -  Rep Notified -   Chest x-ray -  EKG - today Stress Test - several yrs. Ago- per pt. In [place]   ECHO - 07/2019 Cardiac Cath - denies   Sleep Study - yes +sleep apnea CPAP - in use everytime  She is at rest- unsure of settings  Fasting Blood Sugar - 227, HgbA1c done last on 08/06/2019- 7.3 Checks Blood Sugar _2+ times____ times a day  Blood Thinner Instructions:n/a Aspirin Instructions: on hold  ERAS Protcol - PRE-SURGERY Ensure or G2- given G2 with complete instructions   COVID TEST- to be done today   Anesthesia review: Pt. Recently moved here from the state of [place], she has connected with her specialists & her PCP. Sees Dr. Nicholes Stairs for endocrinology with [facility].     Patient denies shortness of breath, fever, cough and chest pain at PAT appointment   All instructions explained to the patient, with a verbal understanding of the material. Patient agrees to go over the instructions while at home for a better understanding. Patient also instructed to self quarantine after being tested for COVID-19. The opportunity to ask questions was provided.

## 2019-08-18 NOTE — Pre-Procedure Instructions (Signed)
[place], [place] - [address] [place] [address] Phone: [phone] Fax: [phone]  [facility] [address] - [place], [place] - [address] [place], [address] [place] [address] Phone: [phone] Fax: [phone]      Your procedure is scheduled on  08-20-19 from 0830 AM-1235pm  Report to [place] Entrance "A" at 0630A.M., and check in at the Admitting office.  Call this number if you have problems the morning of surgery:  [phone]  Call [phone] if you have any questions prior to your surgery date Monday-Friday 8am-4pm    Remember:  Do not eat after midnight the night before your surgery  You may drink clear liquids until 0530 AM the morning of your surgery.   Clear liquids allowed are: Water, Non-Citrus Juices (without pulp), Carbonated Beverages, Clear Tea, Black Coffee Only, and Gatorade.  Please complete your PRE-SURGERY G2 that was provided to you by ... the morning of surgery.  Please, if able, drink it in one setting. DO NOT SIP.   Take these medicines the morning of surgery with A SIP OF WATER: amLODipine (NORVASC) budesonide-formoterol (SYMBICORT)  levothyroxine (SYNTHROID)    7 days prior to surgery STOP taking any Aspirin (unless otherwise instructed by your surgeon), Aleve, Naproxen, Ibuprofen, Motrin, Advil, Goody's, BC's, all herbal medications, fish oil, and all vitamins.   WHAT DO I DO ABOUT MY DIABETES MEDICATION?  . (Amaryl) Glimepiride-Day before surgery do not take evening dose. Day of surgery do not take. Milana Obey before surgery-take 15U. Marland Kitchen Humalog-Take as needed before meals, day before surgery. Do not take bedtime dose. Do not take day of surgery. .  . If your CBG is greater than 220 mg/dL, you may take  of your sliding scale (correction) dose of insulin.   How to Manage Your Diabetes Before and After  Surgery  Why is it important to control my blood sugar before and after surgery? . Improving blood sugar levels before and after surgery helps healing and can limit problems. . A way of improving blood sugar control is eating a healthy diet by: o  Eating less sugar and carbohydrates o  Increasing activity/exercise o  Talking with your doctor about reaching your blood sugar goals . High blood sugars (greater than 180 mg/dL) can raise your risk of infections and slow your recovery, so you will need to focus on controlling your diabetes during the weeks before surgery. . Make sure that the doctor who takes care of your diabetes knows about your planned surgery including the date and location.  How do I manage my blood sugar before surgery? . Check your blood sugar at least 4 times a day, starting 2 days before surgery, to make sure that the level is not too high or low. o Check your blood sugar the morning of your surgery when you wake up and every 2 hours until you get to the Short Stay unit. . If your blood sugar is less than 70 mg/dL, you will need to treat for low blood sugar: o Do not take insulin. o Treat a low blood sugar (less than 70 mg/dL) with  cup of clear juice (cranberry or apple), 4 glucose tablets, OR glucose gel. o Recheck blood sugar in 15 minutes after treatment (to make sure it is greater than 70 mg/dL). If your blood sugar is not greater than 70 mg/dL on  recheck, call [phone] for further instructions. . Report your blood sugar to the short stay nurse when you get to Short Stay.  . If you are admitted to the hospital after surgery: o Your blood sugar will be checked by the staff and you will probably be given insulin after surgery (instead of oral diabetes medicines) to make sure you have good blood sugar levels. o The goal for blood sugar control after surgery is 80-180 mg/dL.   The Morning of Surgery  Do not wear jewelry, make-up or nail polish.  Do not wear  lotions, powders, or perfumes/colognes, or deodorant  Do not shave 48 hours prior to surgery.    Do not bring valuables to the hospital.  [facility] is not responsible for any belongings or valuables.   IF you wear a CPAP at night please bring your mask, tubing, and machine the morning of surgery   Remember that you must have someone to transport you home after your surgery, and remain with you for 24 hours if you are discharged the same day.   Contacts, glasses, hearing aids, dentures or bridgework may not be worn into surgery.    Leave your suitcase in the car.  After surgery it may be brought to your room.  For patients admitted to the hospital, discharge time will be determined by your treatment team.  Patients discharged the day of surgery will not be allowed to drive home.    Special instructions:   [place]- Preparing For Surgery  Before surgery, you can play an important role. Because skin is not sterile, your skin needs to be as free of germs as possible. You can reduce the number of germs on your skin by washing with CHG (chlorahexidine gluconate) Soap before surgery.  CHG is an antiseptic cleaner which kills germs and bonds with the skin to continue killing germs even after washing.    Oral Hygiene is also important to reduce your risk of infection.  Remember - BRUSH YOUR TEETH THE MORNING OF SURGERY WITH YOUR REGULAR TOOTHPASTE  Please do not use if you have an allergy to CHG or antibacterial soaps. If your skin becomes reddened/irritated stop using the CHG.  Do not shave (including legs and underarms) for at least 48 hours prior to first CHG shower. It is OK to shave your face.  Please follow these instructions carefully.   1. Shower the NIGHT BEFORE SURGERY and the MORNING OF SURGERY with CHG Soap.   2. If you chose to wash your hair, wash your hair first as usual with your normal shampoo.  3. After you shampoo, rinse your hair and body thoroughly to remove the  shampoo.  4. Use CHG as you would any other liquid soap. You can apply CHG directly to the skin and wash gently with a scrungie or a clean washcloth.   5. Apply the CHG Soap to your body ONLY FROM THE NECK DOWN.  Do not use on open wounds or open sores. Avoid contact with your eyes, ears, mouth and genitals (private parts). Wash Face and genitals (private parts)  with your normal soap.   6. Wash thoroughly, paying special attention to the area where your surgery will be performed.  7. Thoroughly rinse your body with warm water from the neck down.  8. DO NOT shower/wash with your normal soap after using and rinsing off the CHG Soap.  9. Pat yourself dry with a CLEAN TOWEL.  10. Wear CLEAN PAJAMAS to bed the night  before surgery, wear comfortable clothes the morning of surgery  11. Place CLEAN SHEETS on your bed the night of your first shower and DO NOT SLEEP WITH PETS.    Day of Surgery:  Do not apply any deodorants/lotions. Please shower the morning of surgery with the CHG soap  Please wear clean clothes to the hospital/surgery center.   Remember to brush your teeth WITH YOUR REGULAR TOOTHPASTE.   Please read over the following fact sheets that you were given.

## 2019-08-19 LAB — ABO/RH: ABO/RH(D): O POS

## 2019-08-19 NOTE — Anesthesia Preprocedure Evaluation (Addendum)
Anesthesia Evaluation    Reviewed: Allergy & Precautions, Patient's Chart, lab work & pertinent test results  History of Anesthesia Complications (+) Family history of anesthesia reaction and history of anesthetic complications  Airway Mallampati: II  TM Distance: >3 FB Neck ROM: Full    Dental no notable dental hx.    Pulmonary asthma , sleep apnea ,    Pulmonary exam normal breath sounds clear to auscultation       Cardiovascular hypertension, Pt. 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   Abdominal   Peds  Hematology negative hematology ROS (+) hct 41   Anesthesia Other Findings   Reproductive/Obstetrics negative OB ROS                             Anesthesia Physical Anesthesia Plan  ASA: II  Anesthesia Plan: General   Post-op Pain Management:    Induction: Intravenous  PONV Risk Score and Plan: 3 and Ondansetron, Dexamethasone and Treatment may vary due to age or medical condition  Airway Management Planned: Oral ETT  Additional Equipment: None  Intra-op Plan:   Post-operative Plan: Extubation in OR  Informed Consent: I have reviewed the patients History and Physical, chart, labs and discussed the procedure including the risks, benefits and alternatives for the proposed anesthesia with the patient or authorized representative who has indicated his/her understanding and acceptance.     Dental advisory given  Plan Discussed with:  CRNA  Anesthesia Plan Comments:         Anesthesia Quick Evaluation

## 2019-08-20 ENCOUNTER — Inpatient Hospital Stay (HOSPITAL_COMMUNITY): Payer: Medicare Other

## 2019-08-20 ENCOUNTER — Inpatient Hospital Stay (HOSPITAL_COMMUNITY)
Admission: RE | Admit: 2019-08-20 | Discharge: 2019-08-21 | DRG: 455 | Disposition: A | Discharge status: Home / Self Care | Payer: Medicare Other | Attending: Orthopedic Surgery | Admitting: Orthopedic Surgery

## 2019-08-20 ENCOUNTER — Encounter (HOSPITAL_COMMUNITY)
Admission: RE | Disposition: A | Discharge status: Home / Self Care | Payer: Self-pay | Source: Home / Self Care | Attending: Orthopedic Surgery

## 2019-08-20 ENCOUNTER — Inpatient Hospital Stay (HOSPITAL_COMMUNITY): Payer: Medicare Other | Admitting: Certified Registered Nurse Anesthetist

## 2019-08-20 ENCOUNTER — Other Ambulatory Visit: Payer: Self-pay

## 2019-08-20 ENCOUNTER — Encounter (HOSPITAL_COMMUNITY): Payer: Self-pay

## 2019-08-20 DIAGNOSIS — Z20828 Contact with and (suspected) exposure to other viral communicable diseases: Secondary | ICD-10-CM | POA: Diagnosis present

## 2019-08-20 DIAGNOSIS — M48061 Spinal stenosis, lumbar region without neurogenic claudication: Secondary | ICD-10-CM | POA: Diagnosis present

## 2019-08-20 DIAGNOSIS — E1151 Type 2 diabetes mellitus with diabetic peripheral angiopathy without gangrene: Secondary | ICD-10-CM | POA: Diagnosis present

## 2019-08-20 DIAGNOSIS — Z794 Long term (current) use of insulin: Secondary | ICD-10-CM

## 2019-08-20 DIAGNOSIS — Z885 Allergy status to narcotic agent status: Secondary | ICD-10-CM

## 2019-08-20 DIAGNOSIS — E785 Hyperlipidemia, unspecified: Secondary | ICD-10-CM | POA: Diagnosis present

## 2019-08-20 DIAGNOSIS — Z6837 Body mass index (BMI) 37.0-37.9, adult: Secondary | ICD-10-CM

## 2019-08-20 DIAGNOSIS — R011 Cardiac murmur, unspecified: Secondary | ICD-10-CM | POA: Diagnosis present

## 2019-08-20 DIAGNOSIS — J45909 Unspecified asthma, uncomplicated: Secondary | ICD-10-CM | POA: Diagnosis present

## 2019-08-20 DIAGNOSIS — Z7951 Long term (current) use of inhaled steroids: Secondary | ICD-10-CM | POA: Diagnosis not present

## 2019-08-20 DIAGNOSIS — Z882 Allergy status to sulfonamides status: Secondary | ICD-10-CM

## 2019-08-20 DIAGNOSIS — Z809 Family history of malignant neoplasm, unspecified: Secondary | ICD-10-CM | POA: Diagnosis not present

## 2019-08-20 DIAGNOSIS — Z853 Personal history of malignant neoplasm of breast: Secondary | ICD-10-CM | POA: Diagnosis not present

## 2019-08-20 DIAGNOSIS — K449 Diaphragmatic hernia without obstruction or gangrene: Secondary | ICD-10-CM | POA: Diagnosis present

## 2019-08-20 DIAGNOSIS — Z91011 Allergy to milk products: Secondary | ICD-10-CM | POA: Diagnosis not present

## 2019-08-20 DIAGNOSIS — Z419 Encounter for procedure for purposes other than remedying health state, unspecified: Secondary | ICD-10-CM

## 2019-08-20 DIAGNOSIS — Z880 Allergy status to penicillin: Secondary | ICD-10-CM

## 2019-08-20 DIAGNOSIS — M5116 Intervertebral disc disorders with radiculopathy, lumbar region: Principal | ICD-10-CM | POA: Diagnosis present

## 2019-08-20 DIAGNOSIS — Z833 Family history of diabetes mellitus: Secondary | ICD-10-CM

## 2019-08-20 DIAGNOSIS — E669 Obesity, unspecified: Secondary | ICD-10-CM | POA: Diagnosis present

## 2019-08-20 DIAGNOSIS — Z91040 Latex allergy status: Secondary | ICD-10-CM | POA: Diagnosis not present

## 2019-08-20 DIAGNOSIS — Z79890 Hormone replacement therapy (postmenopausal): Secondary | ICD-10-CM | POA: Diagnosis not present

## 2019-08-20 DIAGNOSIS — G4730 Sleep apnea, unspecified: Secondary | ICD-10-CM | POA: Diagnosis present

## 2019-08-20 DIAGNOSIS — M159 Polyosteoarthritis, unspecified: Secondary | ICD-10-CM | POA: Diagnosis present

## 2019-08-20 DIAGNOSIS — E039 Hypothyroidism, unspecified: Secondary | ICD-10-CM | POA: Diagnosis present

## 2019-08-20 DIAGNOSIS — Z7982 Long term (current) use of aspirin: Secondary | ICD-10-CM

## 2019-08-20 DIAGNOSIS — I10 Essential (primary) hypertension: Secondary | ICD-10-CM | POA: Diagnosis present

## 2019-08-20 DIAGNOSIS — M5410 Radiculopathy, site unspecified: Secondary | ICD-10-CM | POA: Diagnosis present

## 2019-08-20 HISTORY — PX: TRANSFORAMINAL LUMBAR INTERBODY FUSION (TLIF) WITH PEDICLE SCREW FIXATION 1 LEVEL: SHX6141

## 2019-08-20 LAB — GLUCOSE, CAPILLARY
Glucose-Capillary: 144 mg/dL — ABNORMAL HIGH (ref 70–99)
Glucose-Capillary: 233 mg/dL — ABNORMAL HIGH (ref 70–99)
Glucose-Capillary: 276 mg/dL — ABNORMAL HIGH (ref 70–99)
Glucose-Capillary: 214 mg/dL — ABNORMAL HIGH (ref 70–99)

## 2019-08-20 LAB — HEMOGLOBIN A1C
Hgb A1c MFr Bld: 7.3 % — ABNORMAL HIGH (ref 4.8–5.6)
Mean Plasma Glucose: 162.81 mg/dL

## 2019-08-20 IMAGING — RF DG C-ARM 1-60 MIN
1 series · 2 of 2 positions shown · non-contrast
Comparison: None.

CLINICAL DATA: L3-4 TLIF

EXAM:
DG C-ARM 1-60 MIN; LUMBAR SPINE - 2-3 VIEW
TECHNIQUE: TWO-VIEW LUMBAR SPINE

[Series 1: run · 2 of 2 slices shown]
[im 1/2]
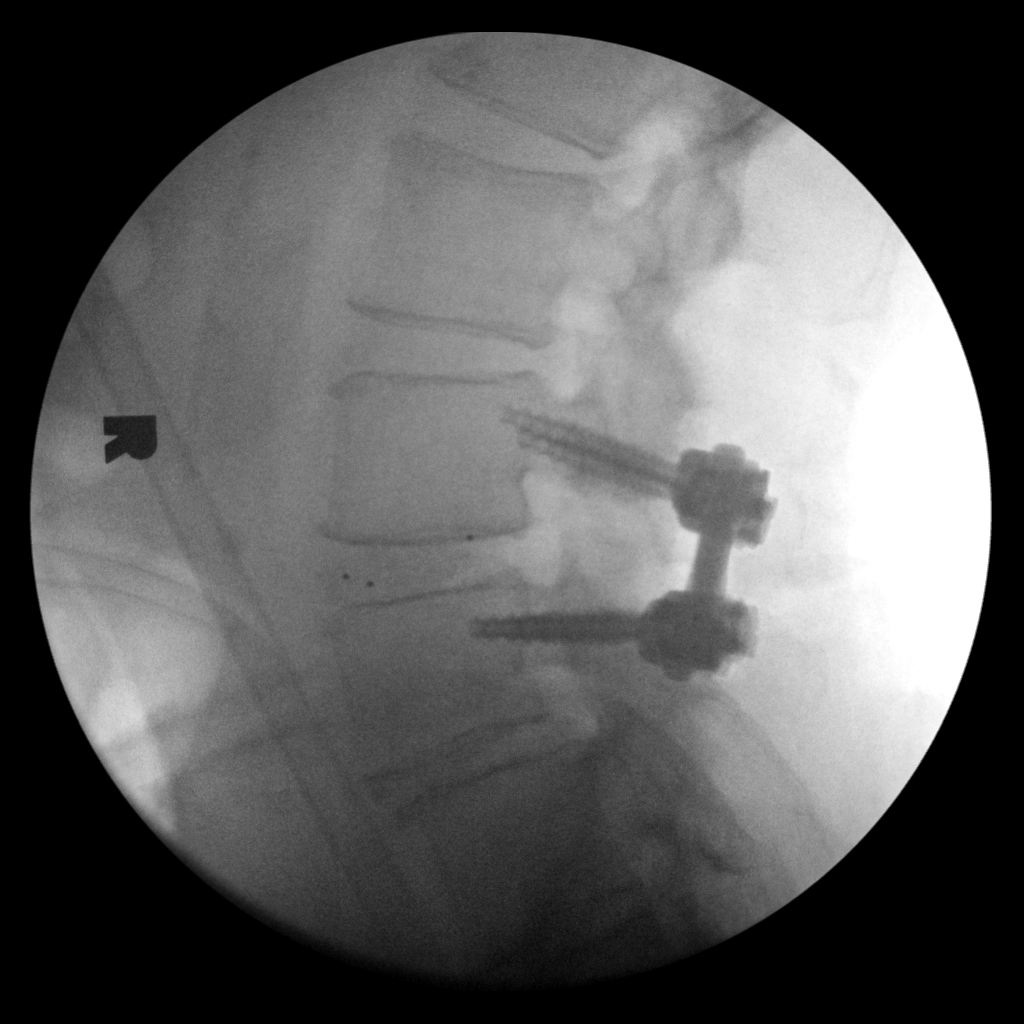
[im 2/2]
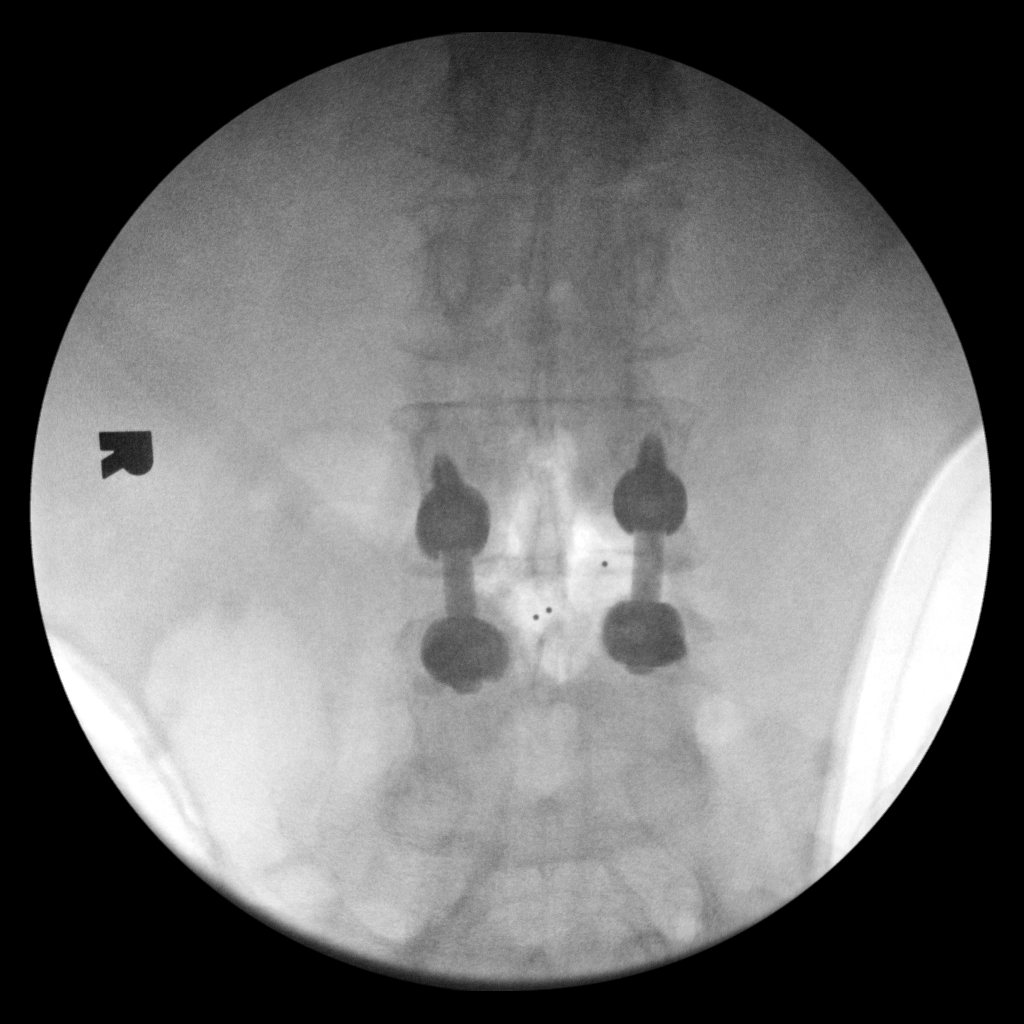

[2 of 2 positions shown; findings below may reference images not displayed]

FINDINGS: 2 intraoperative spot fluoro images were obtained. Due to the small
field of view, definitive localization is not possible, but
appearance would be compatible with the reported history of L3 for
TLIF. Bilateral pedicle screws are evident, presumably at L3 and L4
with interbody graft markers noted in the presumed L3-4 interspace.
IMPRESSION: Intraoperative assessment during TLIF, reportedly at L3-4. No
evidence for immediate hardware complications.

## 2019-08-20 IMAGING — CR DG LUMBAR SPINE 1V
1 series · 1 of 1 positions shown · non-contrast
Comparison: None.  MRI lumbar spine of [DATE]

CLINICAL DATA: L3 -L4 TLIF.

EXAM:
LUMBAR SPINE - 1 VIEW

[lateral]
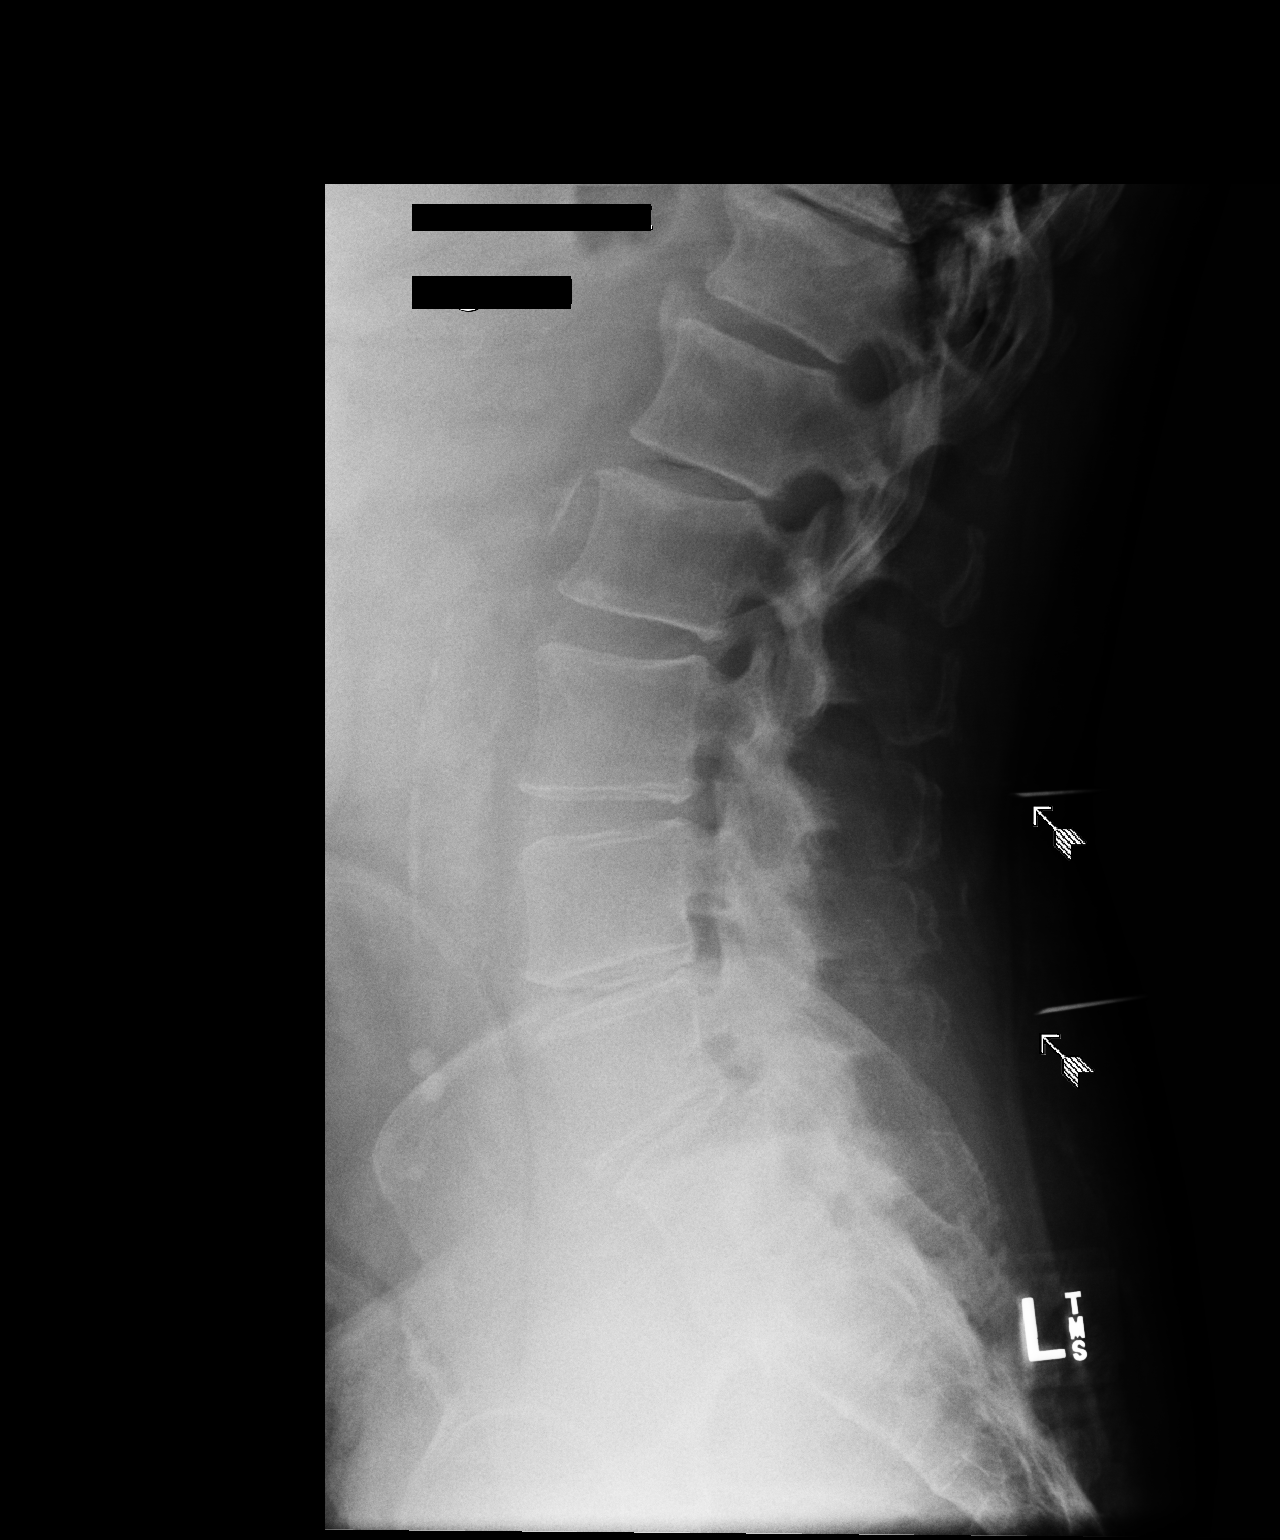

[1 of 1 positions shown; findings below may reference images not displayed]

FINDINGS: Single lateral view displays radiopaque markers directed at the L2-3
interspace with respect to the upper marker and the L4-5
interspace/lower margin of L4 with respect to the lower marker.
Labeling is provided, based on MRI assessment of [DATE] and
location of transitional vertebra at the L5 level with
anterolisthesis of L4 on L5 as outlined in the previous MRI report.
Degenerative changes are noted throughout the lumbar spine and lower
thoracic spine. It has
IMPRESSION: Localization and labeling based on comparison with previous MRI, in
preparation for fusion at the L3-4 level.

## 2019-08-20 IMAGING — RF DG LUMBAR SPINE 2-3V
1 series · 2 of 2 positions shown · non-contrast
Comparison: None.

CLINICAL DATA: L3-4 TLIF

EXAM:
DG C-ARM 1-60 MIN; LUMBAR SPINE - 2-3 VIEW
TECHNIQUE: TWO-VIEW LUMBAR SPINE

[Series 1: run · 2 of 2 slices shown]
[im 1/2]
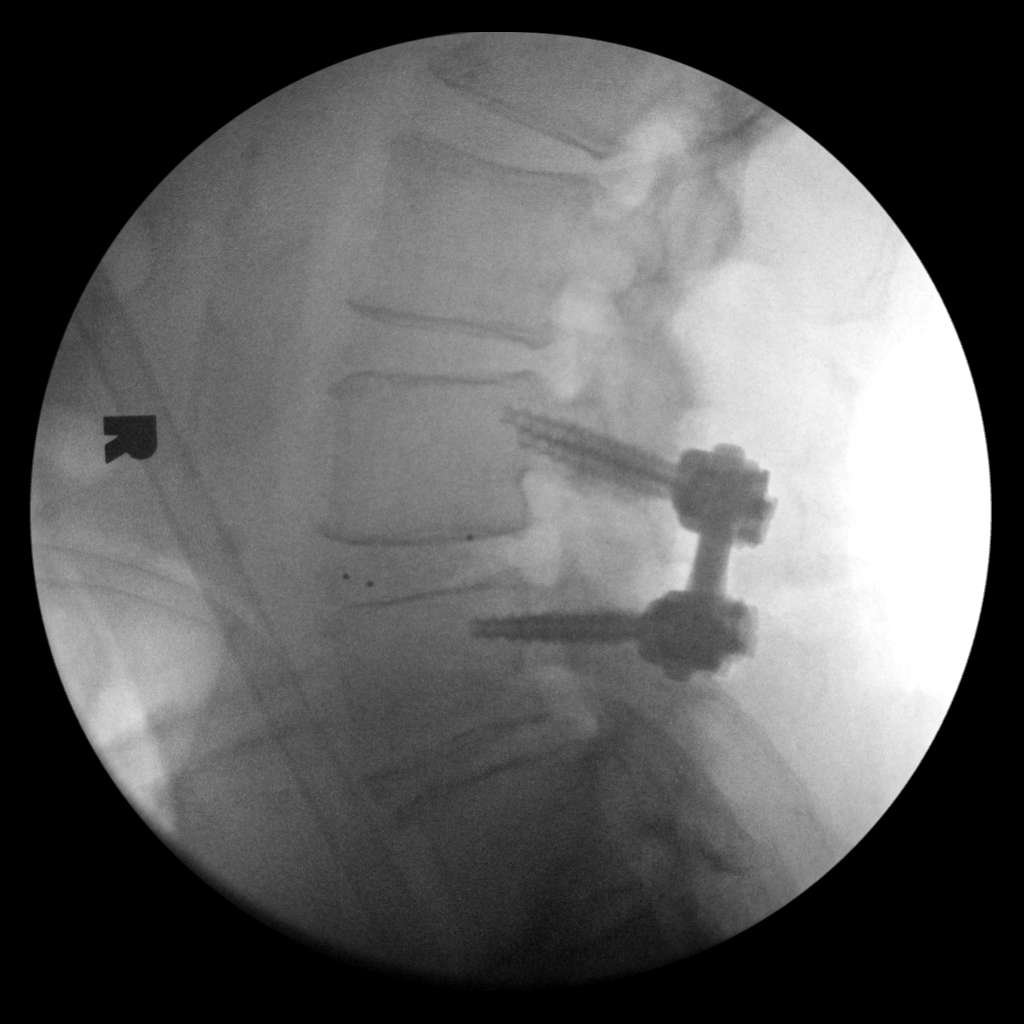
[im 2/2]
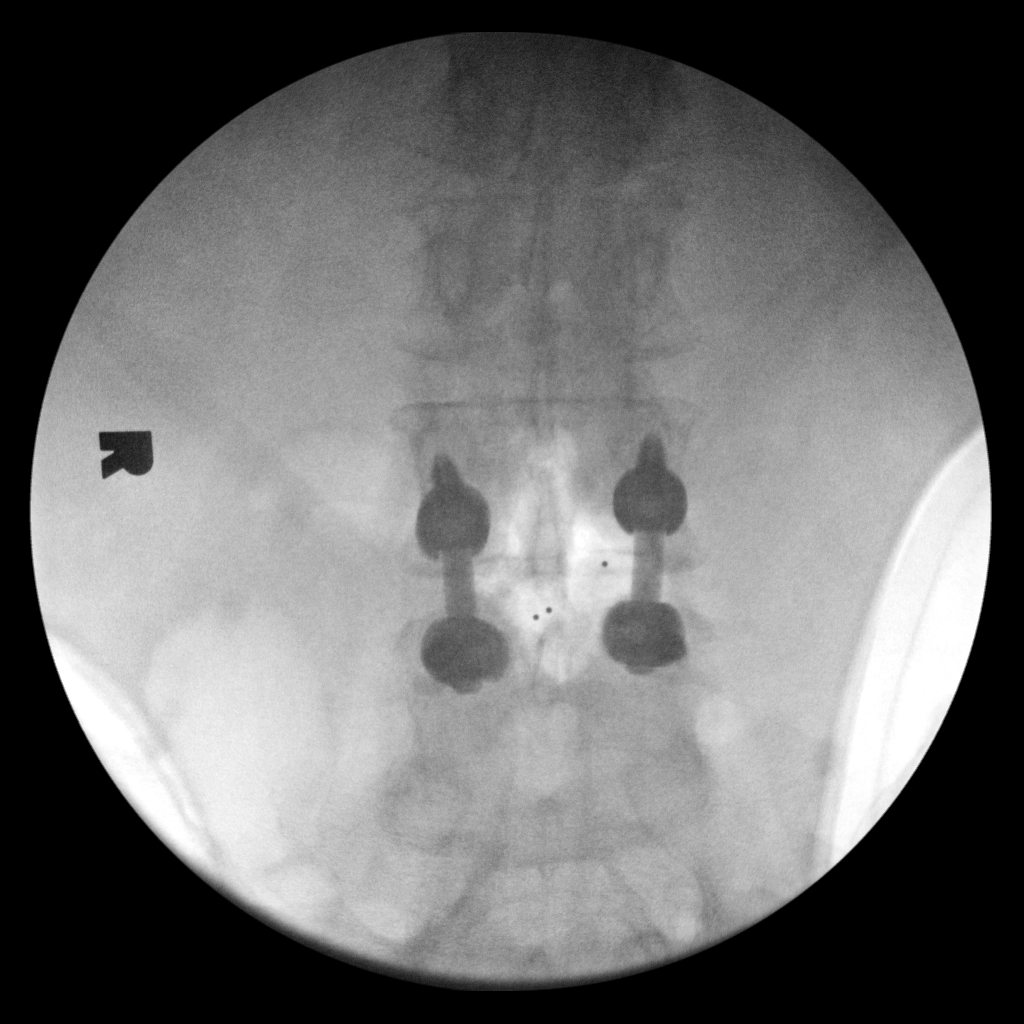

[2 of 2 positions shown; findings below may reference images not displayed]

FINDINGS: 2 intraoperative spot fluoro images were obtained. Due to the small
field of view, definitive localization is not possible, but
appearance would be compatible with the reported history of L3 for
TLIF. Bilateral pedicle screws are evident, presumably at L3 and L4
with interbody graft markers noted in the presumed L3-4 interspace.
IMPRESSION: Intraoperative assessment during TLIF, reportedly at L3-4. No
evidence for immediate hardware complications.

## 2019-08-20 SURGERY — TRANSFORAMINAL LUMBAR INTERBODY FUSION (TLIF) WITH PEDICLE SCREW FIXATION 1 LEVEL
Anesthesia: General | Laterality: Left

## 2019-08-20 MED ORDER — BUPIVACAINE-EPINEPHRINE (PF) 0.5% -1:200000 IJ SOLN
INTRAMUSCULAR | Status: DC | PRN
  Administered 2019-08-20: 7 mL via PERINEURAL

## 2019-08-20 MED ORDER — ALUM & MAG HYDROXIDE-SIMETH 200-200-20 MG/5ML PO SUSP: 30.0000 mL | Freq: Four times a day (QID) | ORAL | Status: DC | PRN

## 2019-08-20 MED ORDER — SUGAMMADEX SODIUM 200 MG/2ML IV SOLN
INTRAVENOUS | Status: DC | PRN
  Administered 2019-08-20: 200 mg via INTRAVENOUS

## 2019-08-20 MED ORDER — SODIUM CHLORIDE 0.9 % IV SOLN: 250.0000 mL | INTRAVENOUS | Status: DC

## 2019-08-20 MED ORDER — INSULIN GLARGINE 100 UNIT/ML ~~LOC~~ SOLN
30.0000 [IU] | Freq: Every day | SUBCUTANEOUS | Status: DC
  Administered 2019-08-20: 30 [IU] via SUBCUTANEOUS
  Filled 2019-08-20 (×3): qty 0.3

## 2019-08-20 MED ORDER — OXYCODONE-ACETAMINOPHEN 5-325 MG PO TABS
1.0000 | ORAL_TABLET | ORAL | Status: DC | PRN
  Administered 2019-08-20 – 2019-08-21 (×6): 2 via ORAL
  Filled 2019-08-20 (×6): qty 2

## 2019-08-20 MED ORDER — ZOLPIDEM TARTRATE 5 MG PO TABS: 5.0000 mg | ORAL_TABLET | Freq: Every evening | ORAL | Status: DC | PRN

## 2019-08-20 MED ORDER — ACETAMINOPHEN 500 MG PO TABS
1000.0000 mg | ORAL_TABLET | Freq: Once | ORAL | Status: AC
  Administered 2019-08-20: 1000 mg via ORAL
  Filled 2019-08-20: qty 2

## 2019-08-20 MED ORDER — FLUTICASONE PROPIONATE 50 MCG/ACT NA SUSP
2.0000 | Freq: Every day | NASAL | Status: DC
  Administered 2019-08-20: 2 via NASAL
  Filled 2019-08-20: qty 16

## 2019-08-20 MED ORDER — MEPERIDINE HCL 25 MG/ML IJ SOLN: 6.2500 mg | INTRAMUSCULAR | Status: DC | PRN

## 2019-08-20 MED ORDER — METFORMIN HCL 500 MG PO TABS
1000.0000 mg | ORAL_TABLET | Freq: Two times a day (BID) | ORAL | Status: DC
  Administered 2019-08-20 – 2019-08-21 (×2): 1000 mg via ORAL
  Filled 2019-08-20 (×2): qty 2

## 2019-08-20 MED ORDER — ATORVASTATIN CALCIUM 40 MG PO TABS
40.0000 mg | ORAL_TABLET | Freq: Every day | ORAL | Status: DC
  Administered 2019-08-20: 22:00:00 40 mg via ORAL
  Filled 2019-08-20: qty 1

## 2019-08-20 MED ORDER — DOCUSATE SODIUM 100 MG PO CAPS
100.0000 mg | ORAL_CAPSULE | Freq: Two times a day (BID) | ORAL | Status: DC
  Administered 2019-08-20: 100 mg via ORAL
  Filled 2019-08-20: qty 1

## 2019-08-20 MED ORDER — THROMBIN 20000 UNITS EX SOLR
CUTANEOUS | Status: DC | PRN
  Administered 2019-08-20: 20000 [IU] via TOPICAL

## 2019-08-20 MED ORDER — ACETAMINOPHEN 650 MG RE SUPP: 650.0000 mg | RECTAL | Status: DC | PRN

## 2019-08-20 MED ORDER — 0.9 % SODIUM CHLORIDE (POUR BTL) OPTIME
TOPICAL | Status: DC | PRN
  Administered 2019-08-20: 1000 mL

## 2019-08-20 MED ORDER — FENOFIBRATE 160 MG PO TABS
160.0000 mg | ORAL_TABLET | Freq: Every day | ORAL | Status: DC
  Administered 2019-08-20: 18:00:00 160 mg via ORAL
  Filled 2019-08-20: qty 1

## 2019-08-20 MED ORDER — ROCURONIUM BROMIDE 10 MG/ML (PF) SYRINGE
PREFILLED_SYRINGE | INTRAVENOUS | Status: DC | PRN
  Administered 2019-08-20 (×2): 50 mg via INTRAVENOUS

## 2019-08-20 MED ORDER — INSULIN ASPART 100 UNIT/ML ~~LOC~~ SOLN
0.0000 [IU] | Freq: Three times a day (TID) | SUBCUTANEOUS | Status: DC
  Administered 2019-08-21: 2 [IU] via SUBCUTANEOUS

## 2019-08-20 MED ORDER — PROMETHAZINE HCL 25 MG/ML IJ SOLN: 6.2500 mg | INTRAMUSCULAR | Status: DC | PRN

## 2019-08-20 MED ORDER — EPHEDRINE SULFATE 50 MG/ML IJ SOLN
INTRAMUSCULAR | Status: DC | PRN
  Administered 2019-08-20: 5 mg via INTRAVENOUS

## 2019-08-20 MED ORDER — FENTANYL CITRATE (PF) 250 MCG/5ML IJ SOLN
INTRAMUSCULAR | Status: AC
  Filled 2019-08-20: qty 5

## 2019-08-20 MED ORDER — INSULIN LISPRO 100 UNIT/ML ~~LOC~~ SOLN: 1.0000 [IU] | Freq: Every day | SUBCUTANEOUS | Status: DC | PRN

## 2019-08-20 MED ORDER — KETOROLAC TROMETHAMINE 30 MG/ML IJ SOLN
30.0000 mg | Freq: Once | INTRAMUSCULAR | Status: AC | PRN
  Administered 2019-08-20: 30 mg via INTRAVENOUS

## 2019-08-20 MED ORDER — BISACODYL 5 MG PO TBEC: 5.0000 mg | DELAYED_RELEASE_TABLET | Freq: Every day | ORAL | Status: DC | PRN

## 2019-08-20 MED ORDER — POTASSIUM CHLORIDE IN NACL 20-0.9 MEQ/L-% IV SOLN: INTRAVENOUS | Status: DC

## 2019-08-20 MED ORDER — SPIRONOLACTONE 50 MG PO TABS
50.0000 mg | ORAL_TABLET | Freq: Every day | ORAL | Status: DC
  Administered 2019-08-20: 50 mg via ORAL
  Filled 2019-08-20 (×2): qty 1

## 2019-08-20 MED ORDER — ONDANSETRON HCL 4 MG PO TABS: 4.0000 mg | ORAL_TABLET | Freq: Four times a day (QID) | ORAL | Status: DC | PRN

## 2019-08-20 MED ORDER — MIDAZOLAM HCL 2 MG/2ML IJ SOLN
INTRAMUSCULAR | Status: DC | PRN
  Administered 2019-08-20 (×2): 1 mg via INTRAVENOUS

## 2019-08-20 MED ORDER — MOMETASONE FURO-FORMOTEROL FUM 200-5 MCG/ACT IN AERO
2.0000 | INHALATION_SPRAY | Freq: Two times a day (BID) | RESPIRATORY_TRACT | Status: DC
  Administered 2019-08-20 – 2019-08-21 (×2): 2 via RESPIRATORY_TRACT
  Filled 2019-08-20: qty 8.8

## 2019-08-20 MED ORDER — GLIMEPIRIDE 2 MG PO TABS
4.0000 mg | ORAL_TABLET | Freq: Two times a day (BID) | ORAL | Status: DC
  Administered 2019-08-20 – 2019-08-21 (×2): 4 mg via ORAL
  Filled 2019-08-20 (×2): qty 2

## 2019-08-20 MED ORDER — FENTANYL CITRATE (PF) 250 MCG/5ML IJ SOLN
INTRAMUSCULAR | Status: DC | PRN
  Administered 2019-08-20: 50 ug via INTRAVENOUS
  Administered 2019-08-20 (×4): 25 ug via INTRAVENOUS
  Administered 2019-08-20: 50 ug via INTRAVENOUS
  Administered 2019-08-20 (×2): 25 ug via INTRAVENOUS

## 2019-08-20 MED ORDER — DIAZEPAM 5 MG PO TABS
5.0000 mg | ORAL_TABLET | Freq: Four times a day (QID) | ORAL | Status: DC | PRN
  Administered 2019-08-21: 08:00:00 5 mg via ORAL
  Filled 2019-08-20: qty 1

## 2019-08-20 MED ORDER — FOLIC ACID 1 MG PO TABS: 0.5000 mg | ORAL_TABLET | Freq: Every day | ORAL | Status: DC

## 2019-08-20 MED ORDER — ONDANSETRON HCL 4 MG/2ML IJ SOLN
INTRAMUSCULAR | Status: DC | PRN
  Administered 2019-08-20: 4 mg via INTRAVENOUS

## 2019-08-20 MED ORDER — CALCIUM CARBONATE-VITAMIN D 500-200 MG-UNIT PO TABS
ORAL_TABLET | Freq: Two times a day (BID) | ORAL | Status: DC
  Administered 2019-08-20: 23:00:00 via ORAL
  Administered 2019-08-20: 1 via ORAL
  Filled 2019-08-20 (×2): qty 1

## 2019-08-20 MED ORDER — METHYLENE BLUE 0.5 % INJ SOLN
INTRAVENOUS | Status: AC
  Filled 2019-08-20: qty 10

## 2019-08-20 MED ORDER — METHYLENE BLUE 0.5 % INJ SOLN
INTRAVENOUS | Status: DC | PRN
  Administered 2019-08-20: .03 mL via SUBMUCOSAL

## 2019-08-20 MED ORDER — ADULT MULTIVITAMIN W/MINERALS CH: ORAL_TABLET | Freq: Every day | ORAL | Status: DC

## 2019-08-20 MED ORDER — FUROSEMIDE 20 MG PO TABS: 20.0000 mg | ORAL_TABLET | Freq: Every day | ORAL | Status: DC | PRN

## 2019-08-20 MED ORDER — PHENYLEPHRINE HCL (PRESSORS) 10 MG/ML IV SOLN
INTRAVENOUS | Status: DC | PRN
  Administered 2019-08-20: 80 ug via INTRAVENOUS

## 2019-08-20 MED ORDER — SODIUM CHLORIDE 0.9% FLUSH: 3.0000 mL | Freq: Two times a day (BID) | INTRAVENOUS | Status: DC

## 2019-08-20 MED ORDER — PROPOFOL 10 MG/ML IV BOLUS
INTRAVENOUS | Status: DC | PRN
  Administered 2019-08-20: 150 mg via INTRAVENOUS

## 2019-08-20 MED ORDER — OXYCODONE-ACETAMINOPHEN 5-325 MG PO TABS: 1.0000 | ORAL_TABLET | ORAL | 0 refills | Status: DC | PRN

## 2019-08-20 MED ORDER — DEXAMETHASONE SODIUM PHOSPHATE 10 MG/ML IJ SOLN
INTRAMUSCULAR | Status: DC | PRN
  Administered 2019-08-20: 5 mg via INTRAVENOUS

## 2019-08-20 MED ORDER — LIDOCAINE 2% (20 MG/ML) 5 ML SYRINGE
INTRAMUSCULAR | Status: DC | PRN
  Administered 2019-08-20: 40 mg via INTRAVENOUS

## 2019-08-20 MED ORDER — IRBESARTAN 150 MG PO TABS
150.0000 mg | ORAL_TABLET | Freq: Every day | ORAL | Status: DC
  Administered 2019-08-20: 150 mg via ORAL
  Filled 2019-08-20 (×2): qty 1

## 2019-08-20 MED ORDER — PROPOFOL 10 MG/ML IV BOLUS
INTRAVENOUS | Status: AC
  Filled 2019-08-20: qty 20

## 2019-08-20 MED ORDER — INSULIN ASPART 100 UNIT/ML ~~LOC~~ SOLN
0.0000 [IU] | Freq: Every day | SUBCUTANEOUS | Status: DC
  Administered 2019-08-20: 2 [IU] via SUBCUTANEOUS

## 2019-08-20 MED ORDER — SEMAGLUTIDE (1 MG/DOSE) 2 MG/1.5ML ~~LOC~~ SOPN: 1.0000 mg | PEN_INJECTOR | SUBCUTANEOUS | Status: DC

## 2019-08-20 MED ORDER — BUPIVACAINE-EPINEPHRINE 0.5% -1:200000 IJ SOLN
INTRAMUSCULAR | Status: AC
  Filled 2019-08-20: qty 1

## 2019-08-20 MED ORDER — FLEET ENEMA 7-19 GM/118ML RE ENEM: 1.0000 | ENEMA | Freq: Once | RECTAL | Status: DC | PRN

## 2019-08-20 MED ORDER — LEVOTHYROXINE SODIUM 25 MCG PO TABS
125.0000 ug | ORAL_TABLET | Freq: Every day | ORAL | Status: DC
  Administered 2019-08-21: 125 ug via ORAL
  Filled 2019-08-20: qty 1

## 2019-08-20 MED ORDER — OXYCODONE HCL 5 MG PO TABS: 5.0000 mg | ORAL_TABLET | Freq: Once | ORAL | Status: DC | PRN

## 2019-08-20 MED ORDER — VANCOMYCIN HCL IN DEXTROSE 1-5 GM/200ML-% IV SOLN
1000.0000 mg | INTRAVENOUS | Status: AC
  Administered 2019-08-20: 1000 mg via INTRAVENOUS
  Filled 2019-08-20: qty 200

## 2019-08-20 MED ORDER — PHENOL 1.4 % MT LIQD: 1.0000 | OROMUCOSAL | Status: DC | PRN

## 2019-08-20 MED ORDER — KETOROLAC TROMETHAMINE 30 MG/ML IJ SOLN
INTRAMUSCULAR | Status: AC
  Filled 2019-08-20: qty 1

## 2019-08-20 MED ORDER — THROMBIN 20000 UNITS EX SOLR
CUTANEOUS | Status: AC
  Filled 2019-08-20: qty 20000

## 2019-08-20 MED ORDER — MIDAZOLAM HCL 2 MG/2ML IJ SOLN
INTRAMUSCULAR | Status: AC
  Filled 2019-08-20: qty 2

## 2019-08-20 MED ORDER — MONTELUKAST SODIUM 10 MG PO TABS
10.0000 mg | ORAL_TABLET | Freq: Every day | ORAL | Status: DC
  Administered 2019-08-20: 10 mg via ORAL
  Filled 2019-08-20 (×2): qty 1

## 2019-08-20 MED ORDER — AMLODIPINE BESYLATE 5 MG PO TABS: 5.0000 mg | ORAL_TABLET | Freq: Every day | ORAL | Status: DC

## 2019-08-20 MED ORDER — OXYCODONE HCL 5 MG/5ML PO SOLN: 5.0000 mg | Freq: Once | ORAL | Status: DC | PRN

## 2019-08-20 MED ORDER — PROPOFOL 500 MG/50ML IV EMUL
INTRAVENOUS | Status: DC | PRN
  Administered 2019-08-20: 25 ug/kg/min via INTRAVENOUS

## 2019-08-20 MED ORDER — POVIDONE-IODINE 7.5 % EX SOLN
Freq: Once | CUTANEOUS | Status: DC
  Filled 2019-08-20: qty 118

## 2019-08-20 MED ORDER — MORPHINE SULFATE (PF) 2 MG/ML IV SOLN: 1.0000 mg | INTRAVENOUS | Status: DC | PRN

## 2019-08-20 MED ORDER — SENNOSIDES-DOCUSATE SODIUM 8.6-50 MG PO TABS
1.0000 | ORAL_TABLET | Freq: Every evening | ORAL | Status: DC | PRN
  Administered 2019-08-20: 1 via ORAL
  Filled 2019-08-20: qty 1

## 2019-08-20 MED ORDER — PHENYLEPHRINE HCL-NACL 10-0.9 MG/250ML-% IV SOLN
INTRAVENOUS | Status: DC | PRN
  Administered 2019-08-20: 15 ug/min via INTRAVENOUS

## 2019-08-20 MED ORDER — ONDANSETRON HCL 4 MG/2ML IJ SOLN: 4.0000 mg | Freq: Four times a day (QID) | INTRAMUSCULAR | Status: DC | PRN

## 2019-08-20 MED ORDER — HYDROMORPHONE HCL 1 MG/ML IJ SOLN
INTRAMUSCULAR | Status: AC
  Filled 2019-08-20: qty 1

## 2019-08-20 MED ORDER — BUPIVACAINE LIPOSOME 1.3 % IJ SUSP
20.0000 mL | INTRAMUSCULAR | Status: DC
  Filled 2019-08-20: qty 20

## 2019-08-20 MED ORDER — DIAZEPAM 5 MG PO TABS: 5.0000 mg | ORAL_TABLET | Freq: Four times a day (QID) | ORAL | 0 refills | Status: DC | PRN

## 2019-08-20 MED ORDER — MENTHOL 3 MG MT LOZG: 1.0000 | LOZENGE | OROMUCOSAL | Status: DC | PRN

## 2019-08-20 MED ORDER — HYDROMORPHONE HCL 1 MG/ML IJ SOLN
0.2500 mg | INTRAMUSCULAR | Status: DC | PRN
  Administered 2019-08-20: 0.5 mg via INTRAVENOUS

## 2019-08-20 MED ORDER — VANCOMYCIN HCL IN DEXTROSE 1-5 GM/200ML-% IV SOLN
1000.0000 mg | Freq: Once | INTRAVENOUS | Status: AC
  Administered 2019-08-20: 1000 mg via INTRAVENOUS
  Filled 2019-08-20: qty 200

## 2019-08-20 MED ORDER — FERROUS SULFATE 325 (65 FE) MG PO TABS
324.0000 mg | ORAL_TABLET | Freq: Every day | ORAL | Status: DC
  Filled 2019-08-20: qty 1

## 2019-08-20 MED ORDER — LACTATED RINGERS IV SOLN
INTRAVENOUS | Status: DC | PRN
  Administered 2019-08-20 (×2): via INTRAVENOUS

## 2019-08-20 MED ORDER — OXYCODONE-ACETAMINOPHEN 5-325 MG PO TABS
ORAL_TABLET | ORAL | Status: AC
  Filled 2019-08-20: qty 2

## 2019-08-20 MED ORDER — SODIUM CHLORIDE 0.9% FLUSH: 3.0000 mL | INTRAVENOUS | Status: DC | PRN

## 2019-08-20 MED ORDER — ACETAMINOPHEN 325 MG PO TABS: 650.0000 mg | ORAL_TABLET | ORAL | Status: DC | PRN

## 2019-08-20 SURGICAL SUPPLY — 89 items
BENZOIN TINCTURE PRP APPL 2/3 (GAUZE/BANDAGES/DRESSINGS) ×3 IMPLANT
BLADE CLIPPER SURG (BLADE) IMPLANT
BONE VIVIGEN FORMABLE 10CC (Bone Implant) ×3 IMPLANT
BUR PRESCISION 1.7 ELITE (BURR) ×3 IMPLANT
BUR ROUND FLUTED 5 RND (BURR) ×2 IMPLANT
BUR ROUND FLUTED 5MM RND (BURR) ×1
CAGE BULLET CONCORDE 9X10X27 (Cage) ×2 IMPLANT
CAGE BULLET CONCORDE 9X10X27MM (Cage) ×1 IMPLANT
CARTRIDGE OIL MAESTRO DRILL (MISCELLANEOUS) ×1 IMPLANT
CATH FOLEY LATEX FREE 16FR (CATHETERS) ×2
CATH FOLEY LF 16FR (CATHETERS) ×1 IMPLANT
CLOSURE STERI-STRIP 1/2X4 (GAUZE/BANDAGES/DRESSINGS) ×1
CLOSURE WOUND 1/2 X4 (GAUZE/BANDAGES/DRESSINGS) ×2
CLSR STERI-STRIP ANTIMIC 1/2X4 (GAUZE/BANDAGES/DRESSINGS) ×2 IMPLANT
CONT SPEC 4OZ CLIKSEAL STRL BL (MISCELLANEOUS) ×3 IMPLANT
COVER BACK TABLE 60X90IN (DRAPES) ×3 IMPLANT
COVER MAYO STAND STRL (DRAPES) ×6 IMPLANT
COVER SURGICAL LIGHT HANDLE (MISCELLANEOUS) ×3 IMPLANT
COVER WAND RF STERILE (DRAPES) IMPLANT
DIFFUSER DRILL AIR PNEUMATIC (MISCELLANEOUS) ×3 IMPLANT
DRAIN CHANNEL 15F RND FF W/TCR (WOUND CARE) IMPLANT
DRAPE C-ARM 42X72 X-RAY (DRAPES) ×3 IMPLANT
DRAPE POUCH INSTRU U-SHP 10X18 (DRAPES) ×3 IMPLANT
DRAPE SURG 17X23 STRL (DRAPES) ×12 IMPLANT
DURAPREP 26ML APPLICATOR (WOUND CARE) ×3 IMPLANT
ELECT BLADE 4.0 EZ CLEAN MEGAD (MISCELLANEOUS) ×3
ELECT CAUTERY BLADE 6.4 (BLADE) ×3 IMPLANT
ELECT REM PT RETURN 9FT ADLT (ELECTROSURGICAL) ×3
ELECTRODE BLDE 4.0 EZ CLN MEGD (MISCELLANEOUS) ×1 IMPLANT
ELECTRODE REM PT RTRN 9FT ADLT (ELECTROSURGICAL) ×1 IMPLANT
EVACUATOR SILICONE 100CC (DRAIN) IMPLANT
FEE INTRAOP MONITOR IMPULS NCS (MISCELLANEOUS) ×1 IMPLANT
FILTER STRAW FLUID ASPIR (MISCELLANEOUS) ×3 IMPLANT
GAUZE 4X4 16PLY RFD (DISPOSABLE) ×3 IMPLANT
GAUZE SPONGE 4X4 12PLY STRL (GAUZE/BANDAGES/DRESSINGS) ×3 IMPLANT
GLOVE BIO SURGEON STRL SZ7 (GLOVE) ×3 IMPLANT
GLOVE BIO SURGEON STRL SZ8 (GLOVE) IMPLANT
GLOVE BIOGEL PI IND STRL 7.0 (GLOVE) ×1 IMPLANT
GLOVE BIOGEL PI IND STRL 8 (GLOVE) ×1 IMPLANT
GLOVE BIOGEL PI INDICATOR 7.0 (GLOVE) ×2
GLOVE BIOGEL PI INDICATOR 8 (GLOVE) ×2
GLOVE SURG SS PI 6.5 STRL IVOR (GLOVE) ×6 IMPLANT
GLOVE SURG SS PI 7.0 STRL IVOR (GLOVE) ×3 IMPLANT
GLOVE SURG SS PI 7.5 STRL IVOR (GLOVE) ×3 IMPLANT
GLOVE SURG SS PI 8.0 STRL IVOR (GLOVE) ×6 IMPLANT
GOWN STRL REUS W/ TWL LRG LVL3 (GOWN DISPOSABLE) ×2 IMPLANT
GOWN STRL REUS W/ TWL XL LVL3 (GOWN DISPOSABLE) ×1 IMPLANT
GOWN STRL REUS W/TWL LRG LVL3 (GOWN DISPOSABLE) ×4
GOWN STRL REUS W/TWL XL LVL3 (GOWN DISPOSABLE) ×2
INTRAOP MONITOR FEE IMPULS NCS (MISCELLANEOUS) ×1
INTRAOP MONITOR FEE IMPULSE (MISCELLANEOUS) ×2
IV CATH 14GX2 1/4 (CATHETERS) ×6 IMPLANT
KIT BASIN OR (CUSTOM PROCEDURE TRAY) ×3 IMPLANT
KIT POSITION SURG JACKSON T1 (MISCELLANEOUS) ×3 IMPLANT
KIT TURNOVER KIT B (KITS) ×3 IMPLANT
MARKER SKIN DUAL TIP RULER LAB (MISCELLANEOUS) ×9 IMPLANT
NEEDLE 18GX1X1/2 (RX/OR ONLY) (NEEDLE) ×3 IMPLANT
NEEDLE 22X1 1/2 (OR ONLY) (NEEDLE) ×6 IMPLANT
NEEDLE HYPO 25GX1X1/2 BEV (NEEDLE) ×3 IMPLANT
NEEDLE SPNL 18GX3.5 QUINCKE PK (NEEDLE) ×6 IMPLANT
NS IRRIG 1000ML POUR BTL (IV SOLUTION) ×3 IMPLANT
OIL CARTRIDGE MAESTRO DRILL (MISCELLANEOUS) ×3
PACK LAMINECTOMY ORTHO (CUSTOM PROCEDURE TRAY) ×3 IMPLANT
PACK UNIVERSAL I (CUSTOM PROCEDURE TRAY) ×3 IMPLANT
PAD ARMBOARD 7.5X6 YLW CONV (MISCELLANEOUS) ×6 IMPLANT
PATTIES SURGICAL .5 X1 (DISPOSABLE) ×3 IMPLANT
PATTIES SURGICAL .5X1.5 (GAUZE/BANDAGES/DRESSINGS) ×3 IMPLANT
PROBE PEDCLE PROBE MAGSTM DISP (MISCELLANEOUS) ×3 IMPLANT
ROD PRE BENT EXPEDIUM 35MM (Rod) ×6 IMPLANT
SCREW SET SINGLE INNER (Screw) ×12 IMPLANT
SCREW VIPER CORT FIX 6.00X30 (Screw) ×12 IMPLANT
SPONGE INTESTINAL PEANUT (DISPOSABLE) ×3 IMPLANT
SPONGE SURGIFOAM ABS GEL 100 (HEMOSTASIS) ×3 IMPLANT
STRIP CLOSURE SKIN 1/2X4 (GAUZE/BANDAGES/DRESSINGS) ×4 IMPLANT
SURGIFLO W/THROMBIN 8M KIT (HEMOSTASIS) IMPLANT
SUT MNCRL AB 4-0 PS2 18 (SUTURE) ×3 IMPLANT
SUT VIC AB 0 CT1 18XCR BRD 8 (SUTURE) ×1 IMPLANT
SUT VIC AB 0 CT1 8-18 (SUTURE) ×2
SUT VIC AB 1 CT1 18XBRD ANBCTR (SUTURE) ×1 IMPLANT
SUT VIC AB 1 CT1 8-18 (SUTURE) ×2
SUT VIC AB 2-0 CT2 18 VCP726D (SUTURE) ×3 IMPLANT
SYR 20ML LL LF (SYRINGE) ×6 IMPLANT
SYR 30ML LL (SYRINGE) ×3 IMPLANT
SYR BULB IRRIGATION 50ML (SYRINGE) ×3 IMPLANT
SYR CONTROL 10ML LL (SYRINGE) ×6 IMPLANT
SYR TB 1ML LUER SLIP (SYRINGE) ×3 IMPLANT
TAPE CLOTH SURG 4X10 WHT LF (GAUZE/BANDAGES/DRESSINGS) ×3 IMPLANT
WATER STERILE IRR 1000ML POUR (IV SOLUTION) ×3 IMPLANT
YANKAUER SUCT BULB TIP NO VENT (SUCTIONS) ×3 IMPLANT

## 2019-08-20 NOTE — H&P (Signed)
PREOPERATIVE H&P  Chief Complaint: Left leg pain and weakness  HPI: Cheryl Finley is a 73 y.o. female who presents with ongoing pain and weakness in the left leg  MRI reveals severe spinal stenosis with a extraforaminal disc herniation at L3/4  Patient has failed multiple forms of conservative care and continues to have pain (see office notes for additional details regarding the patient's full course of treatment)  Past Medical History:  Diagnosis Date  . Anemia    using iron daily  . Arthritis    OA- knees, back, spine, hands   . Asthma   . Cancer [facility]) 2017   breast - pre-cancer  . Complication of anesthesia    hard to wake   . Diabetes mellitus without complication [facility]) [id number]   Dr. Nicholes Stairs- endocrinologist- with [facility]  . Family history of adverse reaction to anesthesia    Mother of pt. hard to wake up   . Heart murmur   . History of hiatal hernia   . Hyperlipemia   . Hypertension   . Hypothyroidism   . Peripheral vascular disease ([place]) 1981   DVT- bilateral LE- postop- hysterectomy  . Pneumonia    as a baby - double pneumonia - still has scarring in her lungs   . Sleep apnea    CPAP- anytime she rests   Past Surgical History:  Procedure Laterality Date  . ABDOMINAL HYSTERECTOMY    . BREAST SURGERY Right 2017   pathology- precancer  . CERVICAL SPINE SURGERY    . CHOLECYSTECTOMY    . DILATION AND CURETTAGE OF UTERUS    . EYE SURGERY Bilateral    cataracts removed.   . hystersalpingogram    . JOINT REPLACEMENT    . KNEE CARTILAGE SURGERY Bilateral    2015, 2020  . PARTIAL HYSTERECTOMY    . SHOULDER ARTHROSCOPY WITH OPEN ROTATOR CUFF REPAIR Left   . THYROIDECTOMY  unsure   Social History   Socioeconomic History  . Marital status: Married    Spouse name: Not on file  . Number of children: Not on file  . Years of education: Not on file  . Highest education level: Not on file  Occupational History  . Not on file  Social Needs  . Financial  resource strain: Not on file  . Food insecurity    Worry: Not on file    Inability: Not on file  . Transportation needs    Medical: Not on file    Non-medical: Not on file  Tobacco Use  . Smoking status: Never Smoker  . Smokeless tobacco: Never Used  Substance and Sexual Activity  . Alcohol use: No  . Drug use: No  . Sexual activity: Not on file  Lifestyle  . Physical activity    Days per week: Not on file    Minutes per session: Not on file  . Stress: Not on file  Relationships  . Social Herbalist on phone: Not on file    Gets together: Not on file    Attends religious service: Not on file    Active member of club or organization: Not on file    Attends meetings of clubs or organizations: Not on file    Relationship status: Not on file  Other Topics Concern  . Not on file  Social History Narrative  . Not on file   Family History  Problem Relation Age of Onset  . Diabetes Mother   .  Cancer Mother   . Cancer Father   . Cancer Sister   . Diabetes Brother   . Cancer Brother    Allergies  Allergen Reactions  . Milk-Related Compounds     Causes a lot of mucus and induces bronchitis  . Penicillins     Unknown, infant allergy  . Percodan [Oxycodone-Aspirin]     Causes severe headaches   . Sulfa Antibiotics     Unknown, infant allergy  . Latex Rash   Prior to Admission medications   Medication Sig Start Date End Date Taking? Authorizing Provider  amLODipine (NORVASC) 5 MG tablet Take 5 mg by mouth daily.   Yes [provider]  Ascorbic Acid (VITAMIN C) 1000 MG tablet Take 1,000 mg by mouth 2 (two) times daily.    Yes [provider]  aspirin 81 MG tablet Take 81 mg by mouth at bedtime.    Yes [provider]  atorvastatin (LIPITOR) 40 MG tablet Take 40 mg by mouth at bedtime.   Yes [provider]  Biotin w/ Vitamins C & E (HAIR/SKIN/NAILS PO) Take 2 tablets by mouth at bedtime.   Yes [provider]   budesonide-formoterol (SYMBICORT) 160-4.5 MCG/ACT inhaler Inhale 2 puffs into the lungs 2 (two) times daily. Sometimes in the a.m., always uses at night   Yes [provider]  Calcium Carbonate-Vitamin D (CALCIUM 600+D PO) Take 1 tablet by mouth 2 (two) times daily.   Yes [provider]  fenofibrate (TRICOR) 145 MG tablet Take 145 mg by mouth at bedtime.    Yes [provider]  ferrous sulfate 324 MG TBEC Take 324 mg by mouth daily after lunch.   Yes [provider]  Flaxseed, Linseed, (FLAX SEED OIL) 1000 MG CAPS Take 1,000 mg by mouth daily.    Yes [provider]  fluticasone (FLONASE) 50 MCG/ACT nasal spray Place 2 sprays into both nostrils at bedtime.    Yes [provider]  folic acid (FOLVITE) A999333 MCG tablet Take 400 mcg by mouth daily.   Yes [provider]  furosemide (LASIX) 20 MG tablet Take 20 mg by mouth daily as needed for fluid.   Yes [provider]  glimepiride (AMARYL) 4 MG tablet Take 4 mg by mouth 2 (two) times daily.   Yes [provider]  ibuprofen (ADVIL) 200 MG tablet Take 200 mg by mouth every 6 (six) hours as needed.   Yes [provider]  insulin glargine (LANTUS) 100 UNIT/ML injection Inject 30 Units into the skin at bedtime.   Yes [provider]  insulin lispro (HUMALOG) 100 UNIT/ML injection Inject 1-15 Units into the skin daily as needed for high blood sugar (above 150). This order is for the a.m. but if she has a  Fasting  BS of > 150 in the a.m. & uses the humalog, then she must continue on that day to check BS before each meal & continue to use the Humalog per the sliding scale .   Yes [provider]  levothyroxine (SYNTHROID) 125 MCG tablet Take 125 mcg by mouth daily before breakfast.    Yes [provider]  Melatonin 5 MG TABS Take 10 mg by mouth at bedtime.   Yes [provider]  metFORMIN (GLUCOPHAGE) 500 MG tablet Take 1,000 mg by  mouth 2 (two) times daily with a meal.    Yes [provider]  montelukast (SINGULAIR) 10 MG tablet Take 10 mg by mouth at bedtime.  Yes [provider]  Multiple Vitamins-Minerals (CENTRUM SILVER PO) Take 1 tablet by mouth daily.    Yes [provider]  Semaglutide, 1 MG/DOSE, (OZEMPIC, 1 MG/DOSE,) 2 MG/1.5ML SOPN Inject 1 mg into the skin every Thursday.   Yes [provider]  spironolactone (ALDACTONE) 50 MG tablet Take 50 mg by mouth daily.   Yes [provider]  telmisartan (MICARDIS) 40 MG tablet Take 40 mg by mouth at bedtime.   Yes [provider]  azithromycin (ZITHROMAX) 250 MG tablet Take 2 tabs PO x 1 dose, then 1 tab PO QD x 4 days Patient not taking: Reported on 08/08/2019 09/18/13   Deneise Lever, MD  benzonatate (TESSALON) 200 MG capsule Take 1 capsule (200 mg total) by mouth at bedtime. Take as needed for cough Patient not taking: Reported on 08/08/2019 09/21/13   Kandra Nicolas, MD  doxycycline (VIBRAMYCIN) 100 MG capsule Take 1 capsule (100 mg total) by mouth 2 (two) times daily. Patient not taking: Reported on 08/08/2019 09/21/13   Kandra Nicolas, MD  predniSONE (DELTASONE) 20 MG tablet Take 1 tablet (20 mg total) by mouth 2 (two) times daily. Patient not taking: Reported on 08/08/2019 09/21/13   Kandra Nicolas, MD     All other systems have been reviewed and were otherwise negative with the exception of those mentioned in the HPI and as above.  Physical Exam: Vitals:   08/20/19 0644  BP: (!) 168/63  Pulse: 88  Resp: 18  Temp: 98.5 F (36.9 C)  SpO2: 99%    Body mass index is 36.68 kg/m.  General: Alert, no acute distress Cardiovascular: No pedal edema Respiratory: No cyanosis, no use of accessory musculature Skin: No lesions in the area of chief complaint Neurologic: Sensation intact distally Psychiatric: Patient is competent for consent with normal mood and affect Lymphatic: No axillary or  cervical lymphadenopathy  MUSCULOSKELETAL: + weakness to left hip flexion  Assessment/Plan: SEVERE SPINAL STENOSIS LUMBAR 3 - LUMBAR 4 WITH EXTRA FORAMINAL DISC HERNIATION Plan for Procedure(s): LEFT-SIDED LUMBAR 3-4 TRANSFORAMINAL LUMBAR INTERBODY FUSION WITH INSTRUMENTATION AND ALLOGRAFT   Norva Karvonen, MD 08/20/2019 8:10 AM

## 2019-08-20 NOTE — Progress Notes (Signed)
Patient has home CPAP set up at bedside 

## 2019-08-20 NOTE — Anesthesia Postprocedure Evaluation (Signed)
Anesthesia Post Note  Patient: Cheryl Finley  Procedure(s) Performed: LEFT-SIDED LUMBAR 3-4 TRANSFORAMINAL LUMBAR INTERBODY FUSION WITH INSTRUMENTATION AND ALLOGRAFT (Left )     Patient location during evaluation: PACU Anesthesia Type: General Level of consciousness: awake and alert, oriented and patient cooperative Pain management: pain level controlled Vital Signs Assessment: post-procedure vital signs reviewed and stable Respiratory status: spontaneous breathing, nonlabored ventilation and respiratory function stable Cardiovascular status: blood pressure returned to baseline and stable Postop Assessment: no apparent nausea or vomiting Anesthetic complications: no    Last Vitals:  Vitals:   08/20/19 1415 08/20/19 1444  BP:  (!) 142/65  Pulse:  78  Resp:  20  Temp: 36.9 C 36.7 C  SpO2:  97%    Last Pain:  Vitals:   08/20/19 1444  TempSrc: Oral  PainSc:                  Pervis Hocking

## 2019-08-20 NOTE — Op Note (Signed)
PATIENT NAME: Cheryl Finley RECORD NO.:   [id number]    DATE OF BIRTH: Nov 25, 1945   DATE OF PROCEDURE: 08/20/2019                               OPERATIVE REPORT     PREOPERATIVE DIAGNOSES: 1. Left-sided lumbar radiculopathy. 2. Severe L3-4 stenosis. 3. Left-sided L3-4 foraminal disc herniation   POSTOPERATIVE DIAGNOSES: 1. Left-sided lumbar radiculopathy. 2. Severe L3-4 stenosis. 3. Left-sided L3-4 foraminal disc herniation   PROCEDURES: 1. L3-4 decompression. 2. Left-sided L3-4 transforaminal lumbar interbody fusion. 3. Right-sided L3-4 posterolateral fusion. 4. Insertion of interbody device x1 (10 x 27 mm Concorde     intervertebral spacer). 5. Placement of posterior instrumentation at L3, L4 bilaterally. 6. Use of local autograft. 7. Use of morselized allograft - ViviGen. 8. Intraoperative use of fluoroscopy.   SURGEON:  Phylliss Bob, MD.   ASSISTANTPricilla Holm, PA-C.   ANESTHESIA:  General endotracheal anesthesia.   COMPLICATIONS:  None.   DISPOSITION:  Stable.   ESTIMATED BLOOD LOSS:   Minimal   INDICATIONS FOR SURGERY:  Briefly, Cheryl Finley is a pleasant 73 y.o. -year-old Female who did present to me with severe and ongoing pain in the left leg, as well as weakness in the left leg. I did feel that the symptoms were secondary to the findings noted above.  The patient failed conservative care and did wish to proceed with the procedure  noted above.  Of note, patient was noted to have stenosis of the level above her surgery as well.  She did understand that this level may become symptomatic in the future, which may require additional surgical intervention.  Of note, she is noted to have transitional anatomy.  The level below her operative level is fused.   OPERATIVE DETAILS:  On 08/20/2019, the patient was brought to surgery and general endotracheal anesthesia was administered.  The patient was placed prone on a well-padded flat Jackson bed with  a spinal frame.  Antibiotics were given and a time-out procedure was performed. The back was prepped and draped in the usual fashion.  A midline incision was made overlying the L3-4 intervertebral space.  The fascia was incised at the midline.  The paraspinal musculature was bluntly swept laterally.  Anatomic landmarks for the pedicles were exposed. Using fluoroscopy, I did cannulate the L3 and L4 pedicles bilaterally, using a medial to lateral cortical trajectory technique.  On the right side, the posterolateral gutter and right facet joint at L3-4 was decorticated and 6 x 30 mm screws were placed and a 35-mm rod was placed and distraction was applied across the rod on the right side.  On the left side, the cannulated pedicle holes were filled with bone wax.  I then proceeded with the decompressive aspect of the procedure.  On the left side, I did perform a thorough and complete neuroforaminal decompression, with near-complete removal of the left L3-4 facet joint.  A partial facetectomy was also performed on the right. I was able to expose the exiting left L3 nerve, which again, was entirely decompressed.  A foraminal disc herniation was noted immediately ventral to the nerve, which was removed, thereby entirely decompressing the nerve. With an assistant holding medial retraction of the traversing left L4 nerve, I did perform an annulotomy at the posterolateral aspect of the L3-4 intervertebral space.  I then used a series of curettes and pituitary rongeurs  to perform a thorough and complete intervertebral diskectomy.  The intervertebral space was then liberally packed with autograft as well as allograft in the form of ViviGen, as was the appropriate-sized intervertebral spacer.  The spacer was then tamped into position in the usual fashion.  I was very pleased with the press-fit of the spacer.  I then placed 6 x 30 mm screws on the left at L3 and L4.  A 30-mm rod was then placed and caps  were placed. The distraction was then released on the contralateral right side.  All 4 caps were then locked.  The wound was copiously irrigated with a total of approximately 3 L prior to placing the bone graft.  Additional autograft and allograft were then packed into the posterolateral gutter on the right side to help aid in the L3-4 fusion.  The wound was explored for any undue bleeding and there was no substantial bleeding encountered.  Gel-Foam was placed over the laminectomy site.  The wound was then closed in layers using #1 Vicryl followed by 2-0 Vicryl, followed by 4-0 Monocryl.  Benzoin and Steri-Strips were applied followed by sterile dressing.  Of note, I did use triggered EMG to test the screws on the left, and there was no screw that tested below 20 mA.  There is no abnormal EMG activity noted throughout the entire surgery.   Of note, Pricilla Holm was my assistant throughout surgery, and did aid in retraction, suctioning, and closure.     Phylliss Bob, MD

## 2019-08-20 NOTE — Progress Notes (Signed)
Pharmacy Antibiotic Note  Cheryl Finley is a 73 y.o. female admitted on 08/20/2019 with L-sided lumbar radiculopathy, severe L3-4 stenosis, L-sided L3-4 foraminal disc herniation. Pt is S/P the following procedures this afternoon: 1. L3-4 decompression. 2. Left-sided L3-4 transforaminal lumbar interbody fusion. 3. Right-sided L3-4 posterolateral fusion. 4. Insertion of interbody device x1 (10 x 27 mm Concorde intervertebral spacer). 5. Placement of posterior instrumentation at L3, L4 bilaterally. 6. Use of local autograft. 7. Use of morselized allograft - ViviGen. 8. Intraoperative use of fluoroscopy  Per RN, pt has no drains in place post-op.  Pharmacy has been consulted for vancomycin dosing for surgical prophylaxis. Patient rec'd vancomycin 1 gm IV X 1 pre-op at 08:05 AM today. Scr 0.72, CrCl 65.7 ml/min.  Plan: Vancomycin 1 gm X 1 at 20:00 PM this evening (12 hrs after pre-op dose)  Height: 5\' 2"  (157.5 cm) Weight: 200 lb 9 oz (91 kg) IBW/kg (Calculated) : 50.1  Temp (24hrs), Avg:98.2 F (36.8 C), Min:97.8 F (36.6 C), Max:98.5 F (36.9 C)  Recent Labs  Lab 08/18/19 1050  WBC 8.2  CREATININE 0.72    Estimated Creatinine Clearance: 65.7 mL/min (by C-G formula based on SCr of 0.72 mg/dL).    Allergies  Allergen Reactions  . Milk-Related Compounds     Causes a lot of mucus and induces bronchitis  . Penicillins     Unknown, infant allergy  . Percodan [Oxycodone-Aspirin]     Causes severe headaches   . Sulfa Antibiotics     Unknown, infant allergy  . Latex Rash    Microbiology results: 11/16 COVID: negative 11/16 MRSA PCR: negative  Thank you for allowing pharmacy to be a part of this patient's care.  Gillermina Hu, PharmD, BCPS, [facility] Clinical Pharmacist 08/20/2019 3:05 PM

## 2019-08-20 NOTE — Transfer of Care (Signed)
Immediate Anesthesia Transfer of Care Note  Patient: Cheryl Finley  Procedure(s) Performed: LEFT-SIDED LUMBAR 3-4 TRANSFORAMINAL LUMBAR INTERBODY FUSION WITH INSTRUMENTATION AND ALLOGRAFT (Left )  Patient Location: PACU  Anesthesia Type:General  Level of Consciousness: awake, alert  and oriented  Airway & Oxygen Therapy: Patient Spontanous Breathing and Patient connected to face mask oxygen  Post-op Assessment: Report given to RN and Post -op Vital signs reviewed and stable  Post vital signs: Reviewed and stable  Last Vitals:  Vitals Value Taken Time  BP    Temp    Pulse    Resp    SpO2      Last Pain:  Vitals:   08/20/19 0741  TempSrc:   PainSc: 2          Complications: No apparent anesthesia complications

## 2019-08-20 NOTE — Anesthesia Procedure Notes (Signed)
Procedure Name: Intubation Date/Time: 08/20/2019 8:35 AM Performed by: Clearnce Sorrel, CRNA Pre-anesthesia Checklist: Patient identified, Emergency Drugs available, Suction available, Patient being monitored and Timeout performed Patient Re-evaluated:Patient Re-evaluated prior to induction Oxygen Delivery Method: Circle system utilized Preoxygenation: Pre-oxygenation with 100% oxygen Induction Type: IV induction Ventilation: Mask ventilation without difficulty Laryngoscope Size: Mac and 3 Grade View: Grade II Tube type: Oral Tube size: 7.0 mm Number of attempts: 1 Airway Equipment and Method: Stylet Placement Confirmation: ETT inserted through vocal cords under direct vision,  positive ETCO2 and breath sounds checked- equal and bilateral Secured at: 22 cm Tube secured with: Tape Dental Injury: Teeth and Oropharynx as per pre-operative assessment

## 2019-08-21 ENCOUNTER — Encounter (HOSPITAL_COMMUNITY): Payer: Self-pay | Admitting: Orthopedic Surgery

## 2019-08-21 LAB — GLUCOSE, CAPILLARY: Glucose-Capillary: 127 mg/dL — ABNORMAL HIGH (ref 70–99)

## 2019-08-21 NOTE — Evaluation (Addendum)
Occupational Therapy Evaluation Patient Details Name: Cheryl Finley MRN: [id number] DOB: [birth year] Today's Date: 08/21/2019    History of Present Illness Pt is a 73 y/o female now s/p L TLIF L3-4. PMHx includes DM, HTN, asthma, anemia, hx of joint replacement and cervical spine sx   Clinical Impression   OT eval completed with no further acute OT needs identified. PTA pt independent with ADL and functional mobility. Pt currently requiring up to [place] for LB ADL, minA for UB ADL including brace management. Reviewed and further educated pt re: back precautions, AE, safety and compensatory techniques for performing ADL and functional transfers with pt verbalizing good understanding, asking appropriate questions throughout. Pt reports plans to return home with spouse assist for ADL/iADL PRN. No further acute OT needs identified at this time. Feel pt is safe to return home once medically ready given available spouse assist. Will sign off, thank you for this referral.     Follow Up Recommendations  No OT follow up;Supervision/Assistance - 24 hour    Equipment Recommendations  3 in 1 bedside commode(for use in shower)           Precautions / Restrictions Precautions Precautions: Fall;Back Precaution Booklet Issued: Yes (comment) Precaution Comments: reviewed with pt, pt able to recall 3/3 precautions Required Braces or Orthoses: Spinal Brace Spinal Brace: Thoracolumbosacral orthotic;Applied in sitting position Restrictions Weight Bearing Restrictions: No      Mobility Bed Mobility               General bed mobility comments: received OOB in recliner, verbally reviewed log roll technique                      ADL either performed or assessed with clinical judgement   ADL Overall ADL's : Needs assistance/impaired Eating/Feeding: Modified independent;Sitting   Grooming: Min guard;Supervision/safety;Standing;Sitting   Upper Body Bathing: Min guard;Sitting    Lower Body Bathing: Minimal assistance;Sit to/from stand   Upper Body Dressing : Minimal assistance;Sitting Upper Body Dressing Details (indicate cue type and reason): reviewed management of TLSO, pt return demonstrating with min cues Lower Body Dressing: Sit to/from stand;Moderate assistance Lower Body Dressing Details (indicate cue type and reason): due to maintaining back precautions, pt reports plans for spouse to assist  Toilet Transfer: Min guard;Ambulation   Toileting- Clothing Manipulation and Hygiene: Minimal assistance;Sit to/from stand Toileting - Clothing Manipulation Details (indicate cue type and reason): reviewed and demo'd use of AE for pericare after toileting, educated pt on resources for where to locate AE, pt verbalizing understanding throughout   Clinical cytogeneticist Details (indicate cue type and reason): reviewed safe tub transfer techniques in order to maintain back precautions, pt verbalizing understanding and reports spouse will be present to assist with transfers. educated on use of 3:1 as shower seat for increased safety/independence during task completion Functional mobility during ADLs: Min guard General ADL Comments: reviewed safety and compensatory techniques for performing ADL and functional transfers, pt verbalizing good understanding throughout     Vision         Perception     Praxis      Pertinent Vitals/Pain Pain Assessment: Faces Faces Pain Scale: Hurts little more Pain Location: back, incisional Pain Descriptors / Indicators: Discomfort;Sore Pain Intervention(s): Limited activity within patient's tolerance;Monitored during session;Repositioned     Hand Dominance     Extremity/Trunk Assessment Upper Extremity Assessment Upper Extremity Assessment: Overall WFL for tasks assessed   Lower Extremity Assessment Lower Extremity Assessment: Defer to PT evaluation  Cervical / Trunk Assessment Cervical / Trunk Assessment: Other  exceptions Cervical / Trunk Exceptions: s/p spinal sx   Communication Communication Communication: No difficulties   Cognition Arousal/Alertness: Awake/alert Behavior During Therapy: WFL for tasks assessed/performed Overall Cognitive Status: Within Functional Limits for tasks assessed                                     General Comments       Exercises     Shoulder Instructions      Home Living Family/patient expects to be discharged to:: Private residence Living Arrangements: Spouse/significant other Available Help at Discharge: Family;Available 24 hours/day Type of Home: (condo)       Home Layout: One level     Bathroom Shower/Tub: Teacher, early years/pre: Standard     Home Equipment: Cane - single point;Toilet riser;Adaptive equipment Adaptive Equipment: Long-handled sponge        Prior Functioning/Environment Level of Independence: Independent                 OT Problem List: Decreased activity tolerance;Pain;Decreased knowledge of precautions;Decreased knowledge of use of DME or AE      OT Treatment/Interventions:      OT Goals(Current goals can be found in the care plan section) Acute Rehab OT Goals Patient Stated Goal: home today OT Goal Formulation: All assessment and education complete, DC therapy  OT Frequency:     Barriers to D/C:            Co-evaluation              AM-PAC OT "6 Clicks" Daily Activity     Outcome Measure Help from another person eating meals?: None Help from another person taking care of personal grooming?: A Little Help from another person toileting, which includes using toliet, bedpan, or urinal?: A Little Help from another person bathing (including washing, rinsing, drying)?: A Little Help from another person to put on and taking off regular upper body clothing?: None Help from another person to put on and taking off regular lower body clothing?: A Little 6 Click Score: 20   End  of Session Equipment Utilized During Treatment: Back brace Nurse Communication: Mobility status  Activity Tolerance: Patient tolerated treatment well Patient left: in chair;with call bell/phone within reach  OT Visit Diagnosis: Other abnormalities of gait and mobility (R26.89);Pain Pain - part of body: (back)                Time: [id number] OT Time Calculation (min): 18 min Charges:  OT General Charges $OT Visit: 1 Visit OT Evaluation $OT Eval Low Complexity: 1 Low  Lou Cal, OT Supplemental Rehabilitation Services Pager [phone] Office [phone]  Raymondo Band 08/21/2019, 10:26 AM

## 2019-08-21 NOTE — Progress Notes (Signed)
    Patient doing well  Patient denies leg pain + expected low back discomfort   Physical Exam: Vitals:   08/20/19 2317 08/21/19 0416  BP: (!) 116/57 135/62  Pulse: 70 77  Resp: 20 18  Temp: 98.2 F (36.8 C) 98.4 F (36.9 C)  SpO2: 98% 98%    Dressing in place NVI  POD #1 s/p L3/4 decompression and fusion, doing well  - up with PT/OT, encourage ambulation - Percocet for pain, Valium for muscle spasms - likely d/c home today with f/u in 2 weeks

## 2019-08-21 NOTE — Evaluation (Signed)
Physical Therapy Evaluation and Discharge Patient Details Name: Cheryl Finley MRN: [id number] DOB: 10/15/1945 Today's Date: 08/21/2019   History of Present Illness  Pt is a 73 y/o female now s/p L TLIF L3-4. PMHx includes DM, HTN, asthma, anemia, hx of joint replacement and cervical spine sx  Clinical Impression  Patient evaluated by Physical Therapy with no further acute PT needs identified. All education has been completed and the patient has no further questions. Pt was able to demonstrate transfers and ambulation with gross modified independence. Pt was educated on precautions, brace application/wearing schedule, appropriate activity progression, and car transfer. Brace adjusted for optimal fit. See below for any follow-up Physical Therapy or equipment needs. PT is signing off. Thank you for this referral.     Follow Up Recommendations No PT follow up;Supervision - Intermittent    Equipment Recommendations  None recommended by PT    Recommendations for Other Services       Precautions / Restrictions Precautions Precautions: Fall;Back Precaution Booklet Issued: Yes (comment) Precaution Comments: reviewed with pt, pt able to recall 3/3 precautions Required Braces or Orthoses: Spinal Brace Spinal Brace: Thoracolumbosacral orthotic;Applied in sitting position Restrictions Weight Bearing Restrictions: No      Mobility  Bed Mobility Overal bed mobility: Modified Independent             General bed mobility comments: Mod I with HOB elevated. Pt reports she is going to be sleeping in the recliner for the first couple weeks. We discussed positioning in the recliner, when it would be appropriate to wear the brace (not while laid back in a more supine position to sleep), and log roll for when she does transition to the bed.   Transfers Overall transfer level: Modified independent   Transfers: Sit to/from Stand           General transfer comment: Slow to rise due to  pain but overall steady without unsteadiness or LOB.   Ambulation/Gait Ambulation/Gait assistance: Modified independent (Device/Increase time) Gait Distance (Feet): 400 Feet Assistive device: None Gait Pattern/deviations: Step-through pattern;Decreased stride length;Trunk flexed Gait velocity: Decreased slightly Gait velocity interpretation: 1.31 - 2.62 ft/sec, indicative of limited community ambulator General Gait Details: VC's for improved posture and forward gaze. Pt ambulating well with steady and smooth gait pattern and no instances of LOB.   Stairs            Wheelchair Mobility    Modified Rankin (Stroke Patients Only)       Balance Overall balance assessment: Needs assistance Sitting-balance support: Feet supported;No upper extremity supported Sitting balance-Leahy Scale: Fair     Standing balance support: No upper extremity supported;During functional activity Standing balance-Leahy Scale: Fair                               Pertinent Vitals/Pain Pain Assessment: Faces Faces Pain Scale: Hurts little more Pain Location: back, incisional Pain Descriptors / Indicators: Discomfort;Sore Pain Intervention(s): Limited activity within patient's tolerance;Monitored during session;Repositioned;RN gave pain meds during session    [place] expects to be discharged to:: Private residence Living Arrangements: Spouse/significant other Available Help at Discharge: Family;Available 24 hours/day Type of Home: (condo)       Home Layout: One level Home Equipment: Cane - single point;Toilet riser;Adaptive equipment      Prior Function Level of Independence: Independent               Hand Dominance  Extremity/Trunk Assessment   Upper Extremity Assessment Upper Extremity Assessment: Overall WFL for tasks assessed    Lower Extremity Assessment Lower Extremity Assessment: Overall WFL for tasks assessed    Cervical /  Trunk Assessment Cervical / Trunk Assessment: Other exceptions Cervical / Trunk Exceptions: s/p spinal sx  Communication   Communication: No difficulties  Cognition Arousal/Alertness: Awake/alert Behavior During Therapy: WFL for tasks assessed/performed Overall Cognitive Status: Within Functional Limits for tasks assessed                                        General Comments      Exercises     Assessment/Plan    PT Assessment Patent does not need any further PT services  PT Problem List         PT Treatment Interventions      PT Goals (Current goals can be found in the Care Plan section)  Acute Rehab PT Goals Patient Stated Goal: home today PT Goal Formulation: All assessment and education complete, DC therapy    Frequency     Barriers to discharge        Co-evaluation               AM-PAC PT "6 Clicks" Mobility  Outcome Measure Help needed turning from your back to your side while in a flat bed without using bedrails?: None Help needed moving from lying on your back to sitting on the side of a flat bed without using bedrails?: None Help needed moving to and from a bed to a chair (including a wheelchair)?: None Help needed standing up from a chair using your arms (e.g., wheelchair or bedside chair)?: None Help needed to walk in hospital room?: None Help needed climbing 3-5 steps with a railing? : None 6 Click Score: 24    End of Session Equipment Utilized During Treatment: Back brace Activity Tolerance: Patient tolerated treatment well Patient left: in chair;with call bell/phone within reach Nurse Communication: Mobility status PT Visit Diagnosis: Unsteadiness on feet (R26.81);Pain Pain - part of body: (back)    Time: 0822-0853 PT Time Calculation (min) (ACUTE ONLY): 31 min   Charges:   PT Evaluation $PT Eval Low Complexity: 1 Low PT Treatments $Gait Training: 8-22 mins        Cheryl Finley, PT, DPT Acute Rehabilitation  Services Pager: [phone] Office: [phone]   Thelma Comp 08/21/2019, 10:56 AM

## 2019-08-21 NOTE — Plan of Care (Signed)
Patient alert and oriented, mae's well, voiding adequate amount of urine, swallowing without difficulty, no c/o pain at time of discharge. Patient discharged home with family. Script and discharged instructions given to patient. Patient and family stated understanding of instructions given. Patient has an appointment with Dr. Dumonski 

## 2019-08-29 MED FILL — Heparin Sodium (Porcine) Inj 1000 Unit/ML: INTRAMUSCULAR | Qty: 30 | Status: AC

## 2019-08-29 MED FILL — Sodium Chloride IV Soln 0.9%: INTRAVENOUS | Qty: 1000 | Status: AC

## 2019-09-02 NOTE — Discharge Summary (Signed)
Patient ID: Cheryl Finley MRN: [id number] DOB/AGE: 1946-08-13 73 y.o.  Admit date: 08/20/2019 Discharge date: 08/21/2019  Admission Diagnoses:  Active Problems:   Radiculopathy   Discharge Diagnoses:  Same  Past Medical History:  Diagnosis Date  . Anemia    using iron daily  . Arthritis    OA- knees, back, spine, hands   . Asthma   . Cancer [facility]) 2017   breast - pre-cancer  . Complication of anesthesia    hard to wake   . Diabetes mellitus without complication [facility]) [id number]   Dr. Nicholes Stairs- endocrinologist- with [facility]  . Family history of adverse reaction to anesthesia    Mother of pt. hard to wake up   . Heart murmur   . History of hiatal hernia   . Hyperlipemia   . Hypertension   . Hypothyroidism   . Peripheral vascular disease ([place]) 1981   DVT- bilateral LE- postop- hysterectomy  . Pneumonia    as a baby - double pneumonia - still has scarring in her lungs   . Sleep apnea    CPAP- anytime she rests    Surgeries: Procedure(s): LEFT-SIDED LUMBAR 3-4 TRANSFORAMINAL LUMBAR INTERBODY FUSION WITH INSTRUMENTATION AND ALLOGRAFT on 08/20/2019   Consultants: None  Discharged Condition: Improved  Hospital Course: Cheryl Finley is an 74 y.o. female who was admitted 08/20/2019 for operative treatment of radiculopathy. Patient has severe unremitting pain that affects sleep, daily activities, and work/hobbies. After pre-op clearance the patient was taken to the operating room on 08/20/2019 and underwent  Procedure(s): LEFT-SIDED LUMBAR 3-4 TRANSFORAMINAL LUMBAR INTERBODY FUSION WITH INSTRUMENTATION AND ALLOGRAFT.    Patient was given perioperative antibiotics:  Anti-infectives (From admission, onward)   Start     Dose/Rate Route Frequency Ordered Stop   08/20/19 2000  vancomycin (VANCOCIN) IVPB 1000 mg/200 mL premix     1,000 mg 200 mL/hr over 60 Minutes Intravenous  Once 08/20/19 1512 08/20/19 2042   08/20/19 0700  vancomycin (VANCOCIN) IVPB 1000 mg/200 mL premix      1,000 mg 200 mL/hr over 60 Minutes Intravenous On call to O.R. 08/20/19 [id number] 08/20/19 0905       Patient was given sequential compression devices, early ambulation to prevent DVT.  Patient benefited maximally from hospital stay and there were no complications.    Recent vital signs: BP (!) 111/48 (BP Location: Left Arm)   Pulse 75   Temp 98.3 F (36.8 C) (Oral)   Resp 16   Ht 5\' 2"  (1.575 m)   Wt 91 kg   SpO2 97%   BMI 36.68 kg/m    Discharge Medications:   Allergies as of 08/21/2019      Reactions   Milk-related Compounds    Causes a lot of mucus and induces bronchitis   Penicillins    Unknown, infant allergy   Percodan [oxycodone-aspirin]    Causes severe headaches    Sulfa Antibiotics    Unknown, infant allergy   Latex Rash      Medication List    TAKE these medications   amLODipine 5 MG tablet Commonly known as: NORVASC Take 5 mg by mouth daily.   aspirin 81 MG tablet Take 81 mg by mouth at bedtime.   atorvastatin 40 MG tablet Commonly known as: LIPITOR Take 40 mg by mouth at bedtime.   azithromycin 250 MG tablet Commonly known as: ZITHROMAX Take 2 tabs PO x 1 dose, then 1 tab PO QD x 4 days   benzonatate  200 MG capsule Commonly known as: TESSALON Take 1 capsule (200 mg total) by mouth at bedtime. Take as needed for cough   budesonide-formoterol 160-4.5 MCG/ACT inhaler Commonly known as: SYMBICORT Inhale 2 puffs into the lungs 2 (two) times daily. Sometimes in the a.m., always uses at night   CALCIUM 600+D PO Take 1 tablet by mouth 2 (two) times daily.   CENTRUM SILVER PO Take 1 tablet by mouth daily.   diazepam 5 MG tablet Commonly known as: VALIUM Take 1 tablet (5 mg total) by mouth every 6 (six) hours as needed for muscle spasms.   doxycycline 100 MG capsule Commonly known as: VIBRAMYCIN Take 1 capsule (100 mg total) by mouth 2 (two) times daily.   fenofibrate 145 MG tablet Commonly known as: TRICOR Take 145 mg by mouth at  bedtime.   ferrous sulfate 324 MG Tbec Take 324 mg by mouth daily after lunch.   Flax Seed Oil 1000 MG Caps Take 1,000 mg by mouth daily.   fluticasone 50 MCG/ACT nasal spray Commonly known as: FLONASE Place 2 sprays into both nostrils at bedtime.   folic acid [id number] MCG tablet Commonly known as: FOLVITE Take 400 mcg by mouth daily.   furosemide 20 MG tablet Commonly known as: LASIX Take 20 mg by mouth daily as needed for fluid.   glimepiride 4 MG tablet Commonly known as: AMARYL Take 4 mg by mouth 2 (two) times daily.   HAIR/SKIN/NAILS PO Take 2 tablets by mouth at bedtime.   insulin glargine 100 UNIT/ML injection Commonly known as: LANTUS Inject 30 Units into the skin at bedtime.   insulin lispro 100 UNIT/ML injection Commonly known as: HUMALOG Inject 1-15 Units into the skin daily as needed for high blood sugar (above 150). This order is for the a.m. but if she has a  Fasting  BS of > 150 in the a.m. & uses the humalog, then she must continue on that day to check BS before each meal & continue to use the Humalog per the sliding scale .   levothyroxine 125 MCG tablet Commonly known as: SYNTHROID Take 125 mcg by mouth daily before breakfast.   Melatonin 5 MG Tabs Take 10 mg by mouth at bedtime.   metFORMIN 500 MG tablet Commonly known as: GLUCOPHAGE Take 1,000 mg by mouth 2 (two) times daily with a meal.   montelukast 10 MG tablet Commonly known as: SINGULAIR Take 10 mg by mouth at bedtime.   oxyCODONE-acetaminophen 5-325 MG tablet Commonly known as: PERCOCET/ROXICET Take 1-2 tablets by mouth every 4 (four) hours as needed for moderate pain or severe pain.   Ozempic (1 MG/DOSE) 2 MG/1.5ML Sopn Generic drug: Semaglutide (1 MG/DOSE) Inject 1 mg into the skin every Thursday.   predniSONE 20 MG tablet Commonly known as: DELTASONE Take 1 tablet (20 mg total) by mouth 2 (two) times daily.   spironolactone 50 MG tablet Commonly known as: ALDACTONE Take 50 mg by  mouth daily.   telmisartan 40 MG tablet Commonly known as: MICARDIS Take 40 mg by mouth at bedtime.   vitamin C 1000 MG tablet Take 1,000 mg by mouth 2 (two) times daily.       Diagnostic Studies: Dg Lumbar Spine 2-3 Views  Result Date: 08/20/2019 CLINICAL DATA:  L3-4 TLIF EXAM: DG C-ARM 1-60 MIN; LUMBAR SPINE - 2-3 VIEW TECHNIQUE: TWO-VIEW LUMBAR SPINE COMPARISON:  None. FINDINGS: 2 intraoperative spot fluoro images were obtained. Due to the small field of view, definitive localization is not possible,  but appearance would be compatible with the reported history of L3 for TLIF. Bilateral pedicle screws are evident, presumably at L3 and L4 with interbody graft markers noted in the presumed L3-4 interspace. IMPRESSION: Intraoperative assessment during TLIF, reportedly at L3-4. No evidence for immediate hardware complications. Electronically Signed   By: Misty Stanley M.D.   On: 08/20/2019 12:24   Dg Lumbar Spine 1 View  Result Date: 08/20/2019 CLINICAL DATA:  L3 -L4 TLIF. EXAM: LUMBAR SPINE - 1 VIEW COMPARISON:  None.  MRI lumbar spine of 04/20/2019 FINDINGS: Single lateral view displays radiopaque markers directed at the L2-3 interspace with respect to the upper marker and the L4-5 interspace/lower margin of L4 with respect to the lower marker. 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 Degenerative changes are noted throughout the lumbar spine and lower thoracic spine. It has IMPRESSION: Localization and labeling based on comparison with previous MRI, in preparation for fusion at the L3-4 level. Electronically Signed   By: Zetta Bills M.D.   On: 08/20/2019 12:34   Dg C-arm 1-60 Min  Result Date: 08/20/2019 CLINICAL DATA:  L3-4 TLIF EXAM: DG C-ARM 1-60 MIN; LUMBAR SPINE - 2-3 VIEW TECHNIQUE: TWO-VIEW LUMBAR SPINE COMPARISON:  None. FINDINGS: 2  intraoperative spot fluoro images were obtained. Due to the small field of view, definitive localization is not possible, but appearance would be compatible with the reported history of L3 for TLIF. Bilateral pedicle screws are evident, presumably at L3 and L4 with interbody graft markers noted in the presumed L3-4 interspace. IMPRESSION: Intraoperative assessment during TLIF, reportedly at L3-4. No evidence for immediate hardware complications. Electronically Signed   By: Misty Stanley M.D.   On: 08/20/2019 12:24    Disposition:    POD #1 s/p L3/4 decompression and fusion, doing well  -Scripts for pain sent to pharmacy electronically  -D/C instructions sheet printed and in chart -D/C today  -F/U in office 2 weeks   Signed: Justice Britain 09/02/2019, 5:45 PM

## 2019-11-09 ENCOUNTER — Ambulatory Visit: Payer: Medicare Other

## 2019-11-30 ENCOUNTER — Ambulatory Visit: Payer: Medicare Other

## 2020-01-20 ENCOUNTER — Other Ambulatory Visit: Payer: Self-pay | Admitting: Orthopedic Surgery

## 2020-01-20 DIAGNOSIS — M5416 Radiculopathy, lumbar region: Secondary | ICD-10-CM

## 2020-01-26 ENCOUNTER — Ambulatory Visit
Admission: RE | Admit: 2020-01-26 | Discharge: 2020-01-26 | Disposition: A | Discharge status: Home / Self Care | Payer: Medicare Other | Source: Ambulatory Visit | Attending: Orthopedic Surgery | Admitting: Orthopedic Surgery

## 2020-01-26 ENCOUNTER — Other Ambulatory Visit: Payer: Self-pay

## 2020-01-26 DIAGNOSIS — M5416 Radiculopathy, lumbar region: Secondary | ICD-10-CM

## 2020-01-26 IMAGING — MR MR LUMBAR SPINE W/O CM
5 series · 48 of 48 positions shown · non-contrast
Comparison: [DATE]

CLINICAL DATA: Low back pain extending to both legs and feet.
Previous lumbar fusion.

EXAM:
MRI LUMBAR SPINE WITHOUT CONTRAST
TECHNIQUE: Multiplanar, multisequence MR imaging of the lumbar spine was
performed. No intravenous contrast was administered.

[Series 2: T2 post-contrast · sagittal · 4.0mm · 1.09mm/px · 5 of 12 slices shown]
[im 1/12]
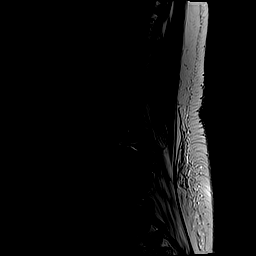
[im 3/12]
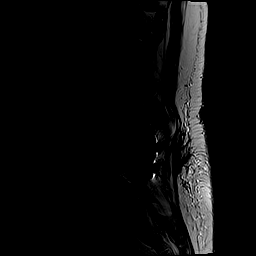
[im 6/12]
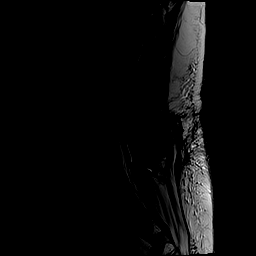
[im 9/12]
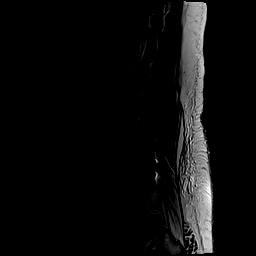
[im 12/12]
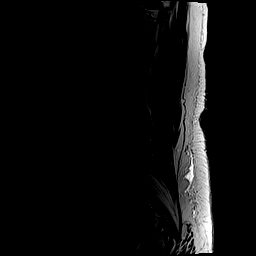

[Series 3: T1 · sagittal · 4.0mm · 1.09mm/px · 5 of 12 slices shown (1 of 2)]
[im 1/12]
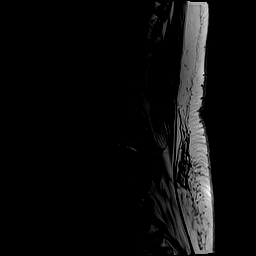
[im 3/12]
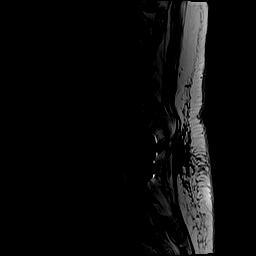
[im 6/12]
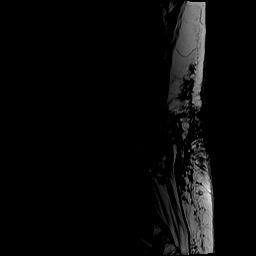
[im 9/12]
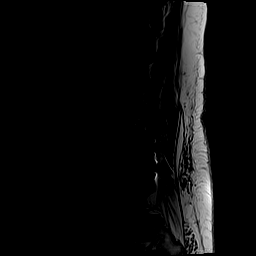
[im 12/12]
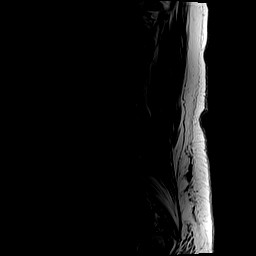

[Series 4: tirm sag · sagittal · 4.0mm · 0.55mm/px · 6 of 12 slices shown]
[im 1/12]
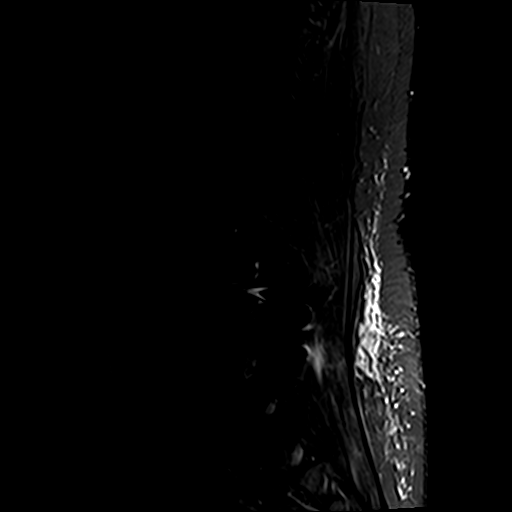
[im 3/12]
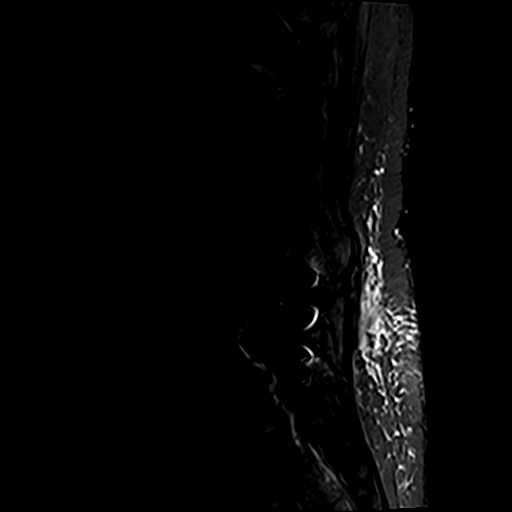
[im 5/12]
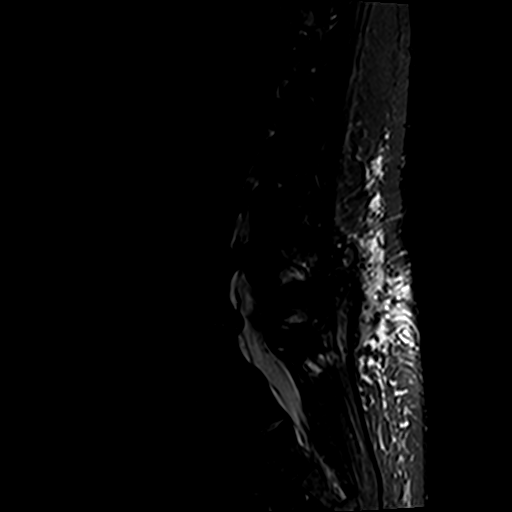
[im 7/12]
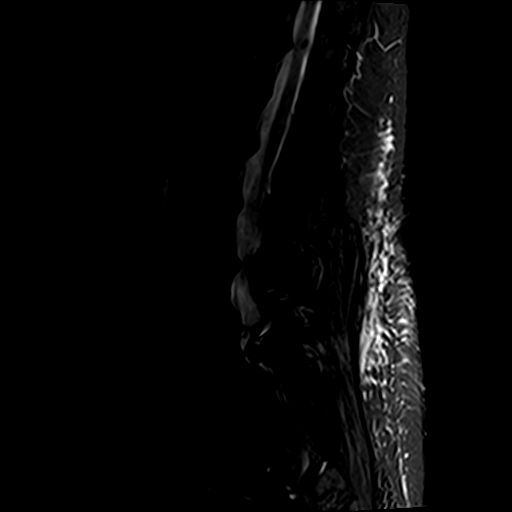
[im 9/12]
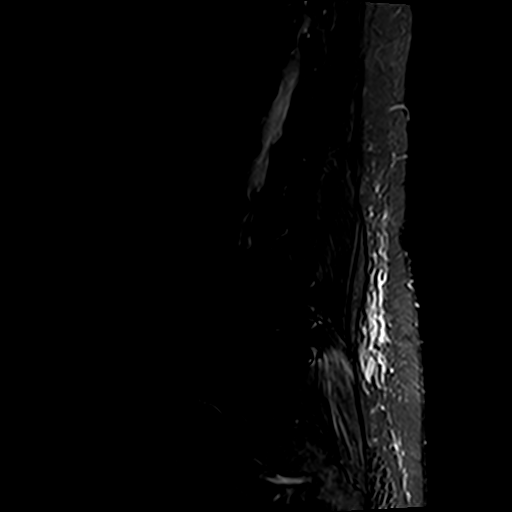
[im 12/12]
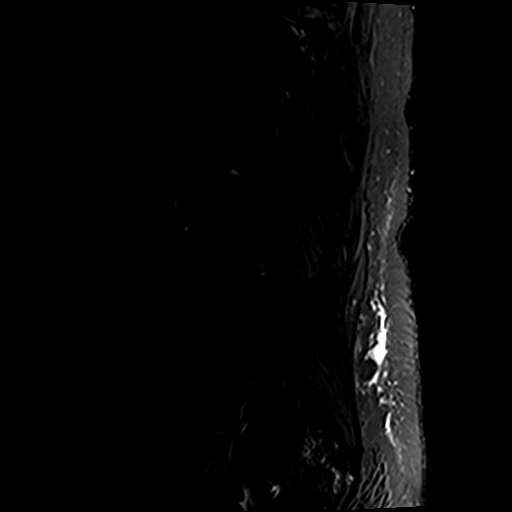

[Series 5: T1 · axial · 4.0mm · 1.04mm/px · z∈[-10,+196]mm · 16 of 34 slices shown (2 of 2)]
[im 1/34]
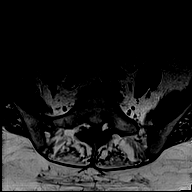
[im 3/34]
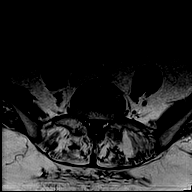
[im 5/34]
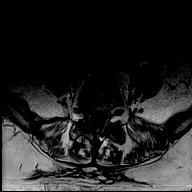
[im 7/34]
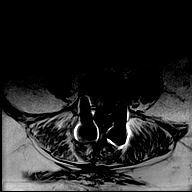
[im 9/34]
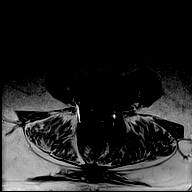
[im 12/34]
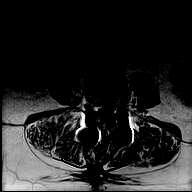
[im 14/34]
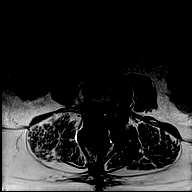
[im 16/34]
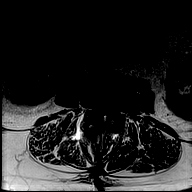
[im 18/34]
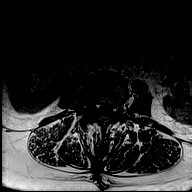
[im 20/34]
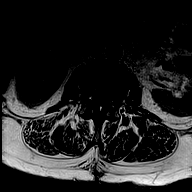
[im 23/34]
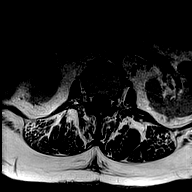
[im 25/34]
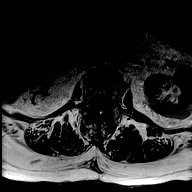
[im 27/34]
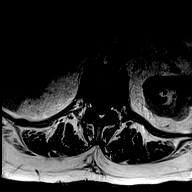
[im 29/34]
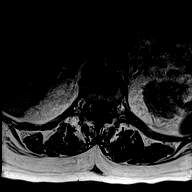
[im 31/34]
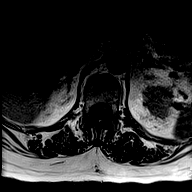
[im 34/34]
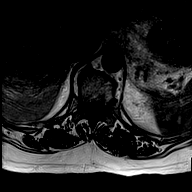

[Series 6: T2 · axial · 4.0mm · 1.04mm/px · z∈[-10,+196]mm · 16 of 34 slices shown]
[im 1/34]
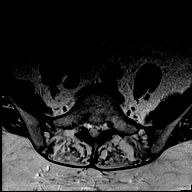
[im 3/34]
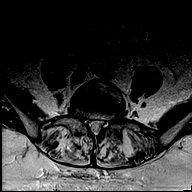
[im 5/34]
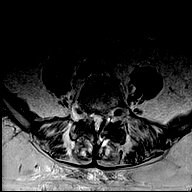
[im 7/34]
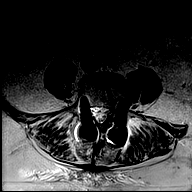
[im 9/34]
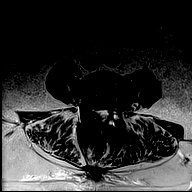
[im 12/34]
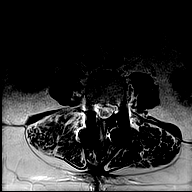
[im 14/34]
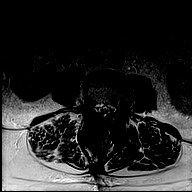
[im 16/34]
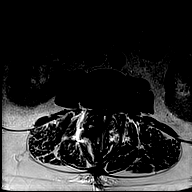
[im 18/34]
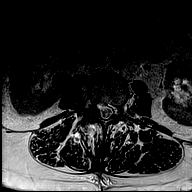
[im 20/34]
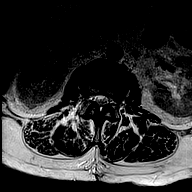
[im 23/34]
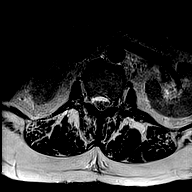
[im 25/34]
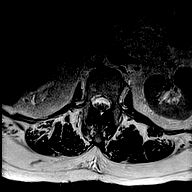
[im 27/34]
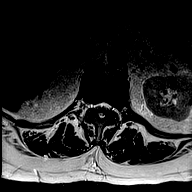
[im 29/34]
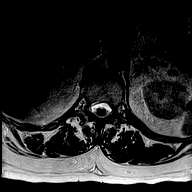
[im 31/34]
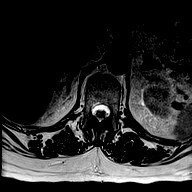
[im 34/34]
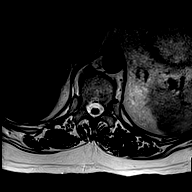

[48 of 48 positions shown; findings below may reference images not displayed]

FINDINGS: Segmentation: Transitional anatomy as described previously. Based on
the location of the conus tip, L5 is described as a sacralized
vertebra.

Alignment:  5 mm anterolisthesis at L4-5.

Vertebrae: No fracture or primary bone lesion. Previous
decompression and fusion procedure L3-4.

Conus medullaris and cauda equina: Conus extends to the L1 level.
Conus and cauda equina appear normal.

Paraspinal and other soft tissues: Negative

Disc levels:

T11-12: Normal.

T12-L1: Bulging of the disc more prominent towards the right. No
compressive stenosis.

L1-2: Mild disc bulge. Facet and ligamentous hypertrophy. Mild
narrowing of the lateral recesses without visible neural
compression.

L2-3: Moderate circumferential protrusion of the disc. Bilateral
facet degeneration and hypertrophy. Multifactorial spinal stenosis
that could cause neural compression on either or both sides.
Bilateral foraminal stenosis could also affect the L2 nerves.
Similar appearance to the previous study.

L3-4: Previous posterior decompression and fusion procedure. Left
hemilaminectomy. Bilateral pedicle screws and posterior rods.
Interbody spacer centrally and towards the left. Annular bulging
more towards the right. Narrowing of the right lateral recess that
could possibly cause neural compression of the right L4 nerve.

L4-5: 5 mm anterolisthesis. Apparent chronic fusion of the facet
joints. No disc pathology. No compressive stenosis.

L5-S1.  Rudimentary and transitional level with wide patency.
IMPRESSION: Anatomy is transitional. L5 is described as a sacralized vertebra to
be consistent with the previous studies.

Interval decompression and fusion surgery at L3-4. Some persistent
stenosis of the right lateral recess that could possibly affect the
right L4 nerve.

Continued multifactorial spinal stenosis at L2-3 because of
circumferential protrusion of the disc in combination with facet and
ligamentous hypertrophy. Neural compression could occur on either or
both sides.

## 2020-02-03 ENCOUNTER — Other Ambulatory Visit: Payer: Self-pay | Admitting: Orthopedic Surgery

## 2020-02-20 NOTE — Progress Notes (Signed)
[place] [place] [address] [place] [address] Phone: [phone] Fax: [phone]  [facility] [address] - [place], [place] - [address] [place] [address] Phone: [phone] Fax: [phone]      Your procedure is scheduled on Feb 25, 2020.  Report to [facility] Main Entrance "A" at 5:30  A.M., and check in at the Admitting office.  Call this number if you have problems the morning of surgery:  [phone]  Call [phone] if you have any questions prior to your surgery date Monday-Friday 8am-4pm    Remember:  Do not eat after midnight the night before your surgery  You may drink clear liquids until 4:30 the morning of your surgery.   Clear liquids allowed are: Water, Non-Citrus Juices (without pulp), Carbonated Beverages, Clear Tea, Black Coffee Only, and Gatorade  Please complete your Pre-Surgery Gatorade G2  that was provided to you by 4:30 the morning of surgery.  Please, if able, drink it in one setting. DO NOT SIP.    Take these medicines the morning of surgery with A SIP OF WATER: amLODipine (NORVASC) budesonide-formoterol (SYMBICORT)  fluticasone (FLONASE) levothyroxine (SYNTHROID)   As of today, STOP taking any Aspirin (unless otherwise instructed by your surgeon) and Aspirin containing products, Aleve, Naproxen, Ibuprofen, Motrin, Advil, Goody's, BC's, all herbal medications, fish oil, and all vitamins.   WHAT DO I DO ABOUT MY DIABETES MEDICATION?   Marland Kitchen Do not take oral diabetes medicines (pills) the morning of surgery. (METFORMIN or Amaryl)  Do not take Amaryl the night before surgery.  You may take Metformin the night before surgery.  . THE NIGHT BEFORE SURGERY, take _____15______ units of ___Lantus________insulin.       . The day of surgery, do not take other diabetes injectables, including Byetta (exenatide), Bydureon  (exenatide ER), Victoza (liraglutide), or Trulicity (dulaglutide).  . If your CBG is greater than 220 mg/dL, you may take  of your sliding scale (correction) dose of insulin.   HOW TO MANAGE YOUR DIABETES BEFORE AND AFTER SURGERY  Why is it important to control my blood sugar before and after surgery? . Improving blood sugar levels before and after surgery helps healing and can limit problems. . A way of improving blood sugar control is eating a healthy diet by: o  Eating less sugar and carbohydrates o  Increasing activity/exercise o  Talking with your doctor about reaching your blood sugar goals . High blood sugars (greater than 180 mg/dL) can raise your risk of infections and slow your recovery, so you will need to focus on controlling your diabetes during the weeks before surgery. . Make sure that the doctor who takes care of your diabetes knows about your planned surgery including the date and location.  How do I manage my blood sugar before surgery? . Check your blood sugar at least 4 times a day, starting 2 days before surgery, to make sure that the level is not too high or low. . Check your blood sugar the morning of your surgery when you wake up and every 2 hours until you get to the Short Stay unit. o If your blood sugar is less than 70 mg/dL, you will need to treat for low blood sugar: - Do not take insulin. - Treat a low blood sugar (less than 70 mg/dL) with  cup of clear juice (cranberry or apple),  4 glucose tablets, OR glucose gel. - Recheck blood sugar in 15 minutes after treatment (to make sure it is greater than 70 mg/dL). If your blood sugar is not greater than 70 mg/dL on recheck, call [phone] for further instructions. . Report your blood sugar to the short stay nurse when you get to Short Stay.  . If you are admitted to the hospital after surgery: o Your blood sugar will be checked by the staff and you will probably be given insulin after surgery (instead of oral  diabetes medicines) to make sure you have good blood sugar levels. o The goal for blood sugar control after surgery is 80-180 mg/dL.                      Do not wear jewelry, make up, or nail polish            Do not wear lotions, powders, perfumes or deodorant.            Do not shave 48 hours prior to surgery.              Do not bring valuables to the hospital.            [facility] is not responsible for any belongings or valuables.  Do NOT Smoke (Tobacco/Vapping) or drink Alcohol 24 hours prior to your procedure If you use a CPAP at night, you may bring all equipment for your overnight stay.   Contacts, glasses, dentures or bridgework may not be worn into surgery.      For patients admitted to the hospital, discharge time will be determined by your treatment team.   Patients discharged the day of surgery will not be allowed to drive home, and someone needs to stay with them for 24 hours.    Special instructions:   [facility]- Preparing For Surgery  Before surgery, you can play an important role. Because skin is not sterile, your skin needs to be as free of germs as possible. You can reduce the number of germs on your skin by washing with CHG (chlorahexidine gluconate) Soap before surgery.  CHG is an antiseptic cleaner which kills germs and bonds with the skin to continue killing germs even after washing.    Oral Hygiene is also important to reduce your risk of infection.  Remember - BRUSH YOUR TEETH THE MORNING OF SURGERY WITH YOUR REGULAR TOOTHPASTE  Please do not use if you have an allergy to CHG or antibacterial soaps. If your skin becomes reddened/irritated stop using the CHG.  Do not shave (including legs and underarms) for at least 48 hours prior to first CHG shower. It is OK to shave your face.  Please follow these instructions carefully.   1. Shower the NIGHT BEFORE SURGERY and the MORNING OF SURGERY with CHG Soap.   2. If you chose to wash your hair, wash your hair  first as usual with your normal shampoo.  3. After you shampoo, rinse your hair and body thoroughly to remove the shampoo.  4. Use CHG as you would any other liquid soap. You can apply CHG directly to the skin and wash gently with a scrungie or a clean washcloth.   5. Apply the CHG Soap to your body ONLY FROM THE NECK DOWN.  Do not use on open wounds or open sores. Avoid contact with your eyes, ears, mouth and genitals (private parts). Wash Face and genitals (private parts)  with your normal soap.  6. Wash thoroughly, paying special attention to the area where your surgery will be performed.  7. Thoroughly rinse your body with warm water from the neck down.  8. DO NOT shower/wash with your normal soap after using and rinsing off the CHG Soap.  9. Pat yourself dry with a CLEAN TOWEL.  10. Wear CLEAN PAJAMAS to bed the night before surgery, wear comfortable clothes the morning of surgery  11. Place CLEAN SHEETS on your bed the night of your first shower and DO NOT SLEEP WITH PETS.   Day of Surgery:   Do not apply any deodorants/lotions.  Please wear clean clothes to the hospital/surgery center.   Remember to brush your teeth WITH YOUR REGULAR TOOTHPASTE.   Please read over the following fact sheets that you were given.

## 2020-02-23 ENCOUNTER — Encounter (HOSPITAL_COMMUNITY)
Admission: RE | Admit: 2020-02-23 | Discharge: 2020-02-23 | Disposition: A | Discharge status: Home / Self Care | Payer: Medicare Other | Source: Ambulatory Visit | Attending: Orthopedic Surgery | Admitting: Orthopedic Surgery

## 2020-02-23 ENCOUNTER — Other Ambulatory Visit (HOSPITAL_COMMUNITY)
Admission: RE | Admit: 2020-02-23 | Discharge: 2020-02-23 | Disposition: A | Discharge status: Home / Self Care | Payer: Medicare Other | Source: Ambulatory Visit | Attending: Orthopedic Surgery | Admitting: Orthopedic Surgery

## 2020-02-23 LAB — SARS CORONAVIRUS 2 (TAT 6-24 HRS): SARS Coronavirus 2: NEGATIVE

## 2020-02-24 ENCOUNTER — Encounter (HOSPITAL_COMMUNITY): Payer: Self-pay

## 2020-02-24 ENCOUNTER — Other Ambulatory Visit: Payer: Self-pay

## 2020-02-24 ENCOUNTER — Encounter (HOSPITAL_COMMUNITY)
Admission: RE | Admit: 2020-02-24 | Discharge: 2020-02-24 | Disposition: A | Discharge status: Home / Self Care | Payer: Medicare Other | Source: Ambulatory Visit | Attending: Orthopedic Surgery | Admitting: Orthopedic Surgery

## 2020-02-24 DIAGNOSIS — E119 Type 2 diabetes mellitus without complications: Secondary | ICD-10-CM | POA: Insufficient documentation

## 2020-02-24 DIAGNOSIS — Z981 Arthrodesis status: Secondary | ICD-10-CM | POA: Insufficient documentation

## 2020-02-24 DIAGNOSIS — I350 Nonrheumatic aortic (valve) stenosis: Secondary | ICD-10-CM | POA: Insufficient documentation

## 2020-02-24 DIAGNOSIS — Z01818 Encounter for other preprocedural examination: Secondary | ICD-10-CM | POA: Insufficient documentation

## 2020-02-24 DIAGNOSIS — G4733 Obstructive sleep apnea (adult) (pediatric): Secondary | ICD-10-CM | POA: Insufficient documentation

## 2020-02-24 DIAGNOSIS — I10 Essential (primary) hypertension: Secondary | ICD-10-CM | POA: Insufficient documentation

## 2020-02-24 LAB — CBC WITH DIFFERENTIAL/PLATELET
Abs Immature Granulocytes: 0.04 10*3/uL (ref 0.00–0.07)
Basophils Absolute: 0.1 10*3/uL (ref 0.0–0.1)
Basophils Relative: 1 %
Eosinophils Absolute: 0.2 10*3/uL (ref 0.0–0.5)
Eosinophils Relative: 2 %
HCT: 39.9 % (ref 36.0–46.0)
Hemoglobin: 12.6 g/dL (ref 12.0–15.0)
Immature Granulocytes: 1 %
Lymphocytes Relative: 34 %
Lymphs Abs: 2.3 10*3/uL (ref 0.7–4.0)
MCH: 29.4 pg (ref 26.0–34.0)
MCHC: 31.6 g/dL (ref 30.0–36.0)
MCV: 93.2 fL (ref 80.0–100.0)
Monocytes Absolute: 0.6 10*3/uL (ref 0.1–1.0)
Monocytes Relative: 8 %
Neutro Abs: 3.8 10*3/uL (ref 1.7–7.7)
Neutrophils Relative %: 54 %
Platelets: 275 10*3/uL (ref 150–400)
RBC: 4.28 MIL/uL (ref 3.87–5.11)
RDW: 13.7 % (ref 11.5–15.5)
WBC: 6.9 10*3/uL (ref 4.0–10.5)
nRBC: 0 % (ref 0.0–0.2)

## 2020-02-24 LAB — SURGICAL PCR SCREEN
MRSA, PCR: NEGATIVE
Staphylococcus aureus: NEGATIVE

## 2020-02-24 LAB — COMPREHENSIVE METABOLIC PANEL
ALT: 35 U/L (ref 0–44)
AST: 45 U/L — ABNORMAL HIGH (ref 15–41)
Albumin: 4.4 g/dL (ref 3.5–5.0)
Alkaline Phosphatase: 55 U/L (ref 38–126)
Anion gap: 10 (ref 5–15)
BUN: 14 mg/dL (ref 8–23)
CO2: 25 mmol/L (ref 22–32)
Calcium: 9.9 mg/dL (ref 8.9–10.3)
Chloride: 109 mmol/L (ref 98–111)
Creatinine, Ser: 0.77 mg/dL (ref 0.44–1.00)
GFR calc Af Amer: >60 mL/min (ref >60)
GFR calc non Af Amer: >60 mL/min (ref >60)
Glucose, Bld: 104 mg/dL — ABNORMAL HIGH (ref 70–99)
Potassium: 5.1 mmol/L (ref 3.5–5.1)
Sodium: 144 mmol/L (ref 135–145)
Total Bilirubin: 0.6 mg/dL (ref 0.3–1.2)
Total Protein: 7.2 g/dL (ref 6.5–8.1)

## 2020-02-24 LAB — URINALYSIS, ROUTINE W REFLEX MICROSCOPIC
Bilirubin Urine: NEGATIVE
Glucose, UA: NEGATIVE mg/dL
Hgb urine dipstick: NEGATIVE
Ketones, ur: NEGATIVE mg/dL
Leukocytes,Ua: NEGATIVE
Nitrite: NEGATIVE
Protein, ur: NEGATIVE mg/dL
Specific Gravity, Urine: 1.011 (ref 1.005–1.030)
pH: 5 (ref 5.0–8.0)

## 2020-02-24 LAB — TYPE AND SCREEN
ABO/RH(D): O POS
Antibody Screen: NEGATIVE

## 2020-02-24 LAB — PROTIME-INR
INR: 1 (ref 0.8–1.2)
Prothrombin Time: 13.2 seconds (ref 11.4–15.2)

## 2020-02-24 LAB — APTT: aPTT: 48 seconds — ABNORMAL HIGH (ref 24–36)

## 2020-02-24 LAB — GLUCOSE, CAPILLARY: Glucose-Capillary: 141 mg/dL — ABNORMAL HIGH (ref 70–99)

## 2020-02-24 NOTE — Anesthesia Preprocedure Evaluation (Addendum)
Anesthesia Evaluation  Patient identified by MRN, date of birth, ID band Patient awake    Reviewed: Allergy & Precautions, H&P , NPO status , Patient's Chart, lab work & pertinent test results  History of Anesthesia Complications (+) Family history of anesthesia reaction  Airway Mallampati: III  TM Distance: >3 FB Neck ROM: Full    Dental no notable dental hx. (+) Teeth Intact, Dental Advisory Given   Pulmonary asthma , sleep apnea and Continuous Positive Airway Pressure Ventilation ,    Pulmonary exam normal breath sounds clear to auscultation       Cardiovascular Exercise Tolerance: Good hypertension, Pt. on medications + Peripheral Vascular Disease  + Valvular Problems/Murmurs AS  Rhythm:Regular Rate:Normal     Neuro/Psych negative neurological ROS  negative psych ROS   GI/Hepatic Neg liver ROS, hiatal hernia,   Endo/Other  diabetes, Insulin Dependent, Oral Hypoglycemic AgentsHypothyroidism   Renal/GU negative Renal ROS  negative genitourinary   Musculoskeletal  (+) Arthritis , Osteoarthritis,    Abdominal   Peds  Hematology  (+) Blood dyscrasia, anemia ,   Anesthesia Other Findings   Reproductive/Obstetrics negative OB ROS                           Anesthesia Physical Anesthesia Plan  ASA: III  Anesthesia Plan: General   Post-op Pain Management:    Induction: Intravenous  PONV Risk Score and Plan: 4 or greater and Ondansetron, Propofol infusion and Dexamethasone  Airway Management Planned: Oral ETT  Additional Equipment: Arterial line  Intra-op Plan:   Post-operative Plan: Extubation in OR  Informed Consent: I have reviewed the patients History and Physical, chart, labs and discussed the procedure including the risks, benefits and alternatives for the proposed anesthesia with the patient or authorized representative who has indicated his/her understanding and acceptance.      Dental advisory given  Plan Discussed with: CRNA  Anesthesia Plan Comments: (PAT note by Karoline Caldwell, PA-C: Patient follows with cardiology at [facility] for history of mild aortic stenosis. Per last note 06/23/2019, "Cheryl Finley is 74 y.o. year old female with a history of mild aortic stenosis and hypertension who presents to [facility] cardiology for the establishment of cardiac follow-up. This patient was followed by an internist and cardiologist in [place] for her problems. She been found to have murmur and mild aortic stenosis was identified. Her last echocardiogram was about 2 years ago and it demonstrated an aortic valve area 1.9 cm consistent with mild aortic stenosis. She is never had a history of coronary disease and denies a previous history of myocardial infarction, congestive heart failure, or valvular heart disease. She does have diabetes and hypertension. Her hypertension is relatively well controlled. She currently denies any exertional symptoms of substernal chest pain. She is involved with problems with her back possibly spinal stenosis and her knees. She denies any syncope or prolonged palpitations."  Follow-up echo was ordered and recommended every 2 years after that.  Echo was done 07/14/2019 shows EF 60 to 55%, mild aortic stenosis (mean gradient 10.7 mmHg).  Pt reports history of difficult intubation with a prior surgery. She recently underwent lumbar fusion 08/20/19 at [facility] and no difficulty noted in anesthesia procedure notes.  OSA on CPAP.  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  Last A1c 7.2 on 01/21/20 per endocrinology notes.  Proep labs reviewed. Elevated APTT at 48. APTT was also 48 prior to surgery 08/20/19. Remainder of labs unremarkable.   EKG 08/18/19:  Normal sinus rhythm. Rate 77. Nonspecific ST abnormality  TTE 07/14/2019 (copy on chart): Interpretation summary: Left ventricular ejection fraction is normal 60-65%. The left ventricular size is normal. There  is borderline left ventricular hypertrophy. The right ventricular ejection fraction is grossly normal. The interatrial septum is intact with no evidence for an atrial septal defect. There is mild aortic stenosis (aortic valve area greater than 1.5 cm). )      Anesthesia Quick Evaluation

## 2020-02-24 NOTE — Progress Notes (Addendum)
Your procedure is scheduled on Wednesday May 26.  Report to [facility] Main Entrance "A" at 05:30 A.M., and check in at the Admitting office.  Call this number if you have problems the morning of surgery: [phone]  Call [phone] if you have any questions prior to your surgery date Monday-Friday 8am-4pm   Remember: Do not eat after midnight the night before your surgery  You may drink clear liquids until 04:30 A.M  the morning of your surgery.   Clear liquids allowed are: Water, Non-Citrus Juices (without pulp), Carbonated Beverages, Clear Tea, Black Coffee Only, and Gatorade  Please complete your PRE-SURGERY G2 hat was provided to you by 4:30 A.M the morning of surgery.  Please, if able, drink it in one setting. DO NOT SIP.    Take these medicines the morning of surgery with A SIP OF WATER: amLODipine (NORVASC) budesonide-formoterol (SYMBICORT) ---- Please bring all inhalers with you the day of surgery.  fluticasone (FLONASE) levothyroxine (SYNTHROID)   As of today, STOP taking any Aspirin (unless otherwise instructed by your surgeon), Aleve, Naproxen, Ibuprofen, Motrin, Advil, Goody's, BC's, all herbal medications, fish oil, and all vitamins.  Follow your surgeon's instructions on when to stop Aspirin.  If no instructions were given by your surgeon then you will need to call the office to get those instructions.     WHAT DO I DO ABOUT MY DIABETES MEDICATION?   Marland Kitchen Do not take oral diabetes medicines (pills) the morning of surgery.  . THE DAY BEFORE SURGERY o glimepiride (AMARYL) - take morning dose ONLY (NO evening dose) o insulin glargine (LANTUS) - 15 units at bedtime o insulin lispro (HUMALOG) - (AM dose - take as usual) // (PM dose - NONE) o metFORMIN (GLUCOPHAGE) - take as usual   . THE MORNING OF SURGERY  o glimepiride (AMARYL) - NONE o insulin glargine (LANTUS) - NONE o insulin lispro (HUMALOG) - NONE - (If your CBG is greater than 220 mg/dL, you may take   of your sliding scale (correction) dose of insulin.) o metFORMIN (GLUCOPHAGE) - NONE    HOW TO MANAGE YOUR DIABETES BEFORE AND AFTER SURGERY  Why is it important to control my blood sugar before and after surgery? . Improving blood sugar levels before and after surgery helps healing and can limit problems. . A way of improving blood sugar control is eating a healthy diet by: o  Eating less sugar and carbohydrates o  Increasing activity/exercise o  Talking with your doctor about reaching your blood sugar goals . High blood sugars (greater than 180 mg/dL) can raise your risk of infections and slow your recovery, so you will need to focus on controlling your diabetes during the weeks before surgery. . Make sure that the doctor who takes care of your diabetes knows about your planned surgery including the date and location.  How do I manage my blood sugar before surgery? . Check your blood sugar at least 4 times a day, starting 2 days before surgery, to make sure that the level is not too high or low. . Check your blood sugar the morning of your surgery when you wake up and every 2 hours until you get to the Short Stay unit. o If your blood sugar is less than 70 mg/dL, you will need to treat for low blood sugar: - Do not take insulin. - Treat a low blood sugar (less than 70 mg/dL) with  cup of clear juice (cranberry or apple), 4 glucose tablets, OR  glucose gel. - Recheck blood sugar in 15 minutes after treatment (to make sure it is greater than 70 mg/dL). If your blood sugar is not greater than 70 mg/dL on recheck, call [phone] for further instructions. . Report your blood sugar to the short stay nurse when you get to Short Stay.  . If you are admitted to the hospital after surgery: o Your blood sugar will be checked by the staff and you will probably be given insulin after surgery (instead of oral diabetes medicines) to make sure you have good blood sugar levels. o The goal for blood  sugar control after surgery is 80-180 mg/dL.    The Morning of Surgery  Do not wear jewelry, make-up or nail polish.  Do not wear lotions, powders, or perfumes, or deodorant  Do not shave 48 hours prior to surgery.    Do not bring valuables to the hospital.  [facility] is not responsible for any belongings or valuables.  If you are a smoker, DO NOT Smoke 24 hours prior to surgery  If you wear a CPAP at night please bring your mask the morning of surgery   Remember that you must have someone to transport you home after your surgery, and remain with you for 24 hours if you are discharged the same day.   Please bring cases for contacts, glasses, hearing aids, dentures or bridgework because it cannot be worn into surgery.    Leave your suitcase in the car.  After surgery it may be brought to your room.  For patients admitted to the hospital, discharge time will be determined by your treatment team.  Patients discharged the day of surgery will not be allowed to drive home.    Special instructions:   [facility]- Preparing For Surgery  Before surgery, you can play an important role. Because skin is not sterile, your skin needs to be as free of germs as possible. You can reduce the number of germs on your skin by washing with CHG (chlorahexidine gluconate) Soap before surgery.  CHG is an antiseptic cleaner which kills germs and bonds with the skin to continue killing germs even after washing.    Oral Hygiene is also important to reduce your risk of infection.  Remember - BRUSH YOUR TEETH THE MORNING OF SURGERY WITH YOUR REGULAR TOOTHPASTE  Please do not use if you have an allergy to CHG or antibacterial soaps. If your skin becomes reddened/irritated stop using the CHG.  Do not shave (including legs and underarms) for at least 48 hours prior to first CHG shower. It is OK to shave your face.  Please follow these instructions carefully.   1. Shower the NIGHT BEFORE SURGERY and the  MORNING OF SURGERY with CHG Soap.   2. If you chose to wash your hair and body, wash as usual with your normal shampoo and body-wash/soap.  3. Rinse your hair and body thoroughly to remove the shampoo and soap.  4. Apply CHG directly to the skin (ONLY FROM THE NECK DOWN) and wash gently with a scrungie or a clean washcloth.   5. Do not use on open wounds or open sores. Avoid contact with your eyes, ears, mouth and genitals (private parts). Wash Face and genitals (private parts)  with your normal soap.   6. Wash thoroughly, paying special attention to the area where your surgery will be performed.  7. Thoroughly rinse your body with warm water from the neck down.  8. DO NOT shower/wash with your  normal soap after using and rinsing off the CHG Soap.  9. Pat yourself dry with a CLEAN TOWEL.  10. Wear CLEAN PAJAMAS to bed the night before surgery  11. Place CLEAN SHEETS on your bed the night of your first shower and DO NOT SLEEP WITH PETS.  12. Wear comfortable clothes the morning of surgery.     Day of Surgery:  Please shower the morning of surgery with the CHG soap Do not apply any deodorants/lotions. Please wear clean clothes to the hospital/surgery center.   Remember to brush your teeth WITH YOUR REGULAR TOOTHPASTE.   Please read over the following fact sheets that you were given.

## 2020-02-24 NOTE — Progress Notes (Signed)
Anesthesia Chart Review:  Patient follows with cardiology at [facility], [place] for history of mild aortic stenosis. Per last note 06/23/2019, "Cheryl Finley is 74 y.o. year old female with a history of mild aortic stenosis and hypertension who presents to [place] cardiology for the establishment of cardiac follow-up. This patient was followed by an internist and cardiologist in [place] for her problems. She been found to have murmur and mild aortic stenosis was identified. Her last echocardiogram was about 2 years ago and it demonstrated an aortic valve area 1.9 cm consistent with mild aortic stenosis. She is never had a history of coronary disease and denies a previous history of myocardial infarction, congestive heart failure, or valvular heart disease. She does have diabetes and hypertension. Her hypertension is relatively well controlled. She currently denies any exertional symptoms of substernal chest pain. She is involved with problems with her back possibly spinal stenosis and her knees. She denies any syncope or prolonged palpitations."  Follow-up echo was ordered and recommended every 2 years after that.  Echo was done 07/14/2019 shows EF 60 to 55%, mild aortic stenosis (mean gradient 10.7 mmHg).  Pt reports history of difficult intubation with a prior surgery. She recently underwent lumbar fusion 08/20/19 at [facility] and no difficulty noted in anesthesia procedure notes.  OSA on CPAP.  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  Last A1c 7.2 on 01/21/20 per endocrinology notes.  Proep labs reviewed. Elevated APTT at 48. APTT was also 48 prior to surgery 08/20/19. Remainder of labs unremarkable.   EKG 08/18/19: Normal sinus rhythm. Rate 77. Nonspecific ST abnormality  TTE 07/14/2019 (copy on chart): Interpretation summary: Left ventricular ejection fraction is normal 60-65%. The left ventricular size is normal. There is borderline left ventricular hypertrophy. The right ventricular ejection fraction is  grossly normal. The interatrial septum is intact with no evidence for an atrial septal defect. There is mild aortic stenosis (aortic valve area greater than 1.5 cm).   [name] [facility] Stay [facility]/Anesthesiology Phone [phone] 02/24/2020 5:13 PM

## 2020-02-24 NOTE — Progress Notes (Signed)
PCP - Rene Kocher, MD Cardiologist - Loni Beckwith, MD  Endocrinologist - Iran Planas, MD  Pulmonary specialist - Michaelle Copas, MD  PPM/ICD - n/a Device Orders - n/a  Rep Notified - n/a  Chest x-ray - n/a EKG - 08/18/19 Stress Test - pt says she got it ages ago (>10 years ago) ECHO - 07/14/19 Cardiac Cath - pt denies  Sleep Study - yes CPAP - yes  Fasting Blood Sugar - 94-150s (per pt she likes to try to maintain her sugars 100-120 by diet and medications she's been prescribed)   Checks Blood Sugar 4x/day  Blood Thinner Instructions: n/a Aspirin Instructions: Follow your surgeon's instructions on when to stop Aspirin.  If no instructions were given by your surgeon then you will need to call the office to get those instructions.   Per pt, last dose ASA 02/11/20   ERAS Protcol - yes PRE-SURGERY Ensure or G2  COVID TEST- 02/23/20 (neg result)  Anesthesia review: hx anesthesia complication   (per pt "they didn't believe me when I told them I would stop breathing as soon as I went under, I told them they needed to give me the breathing tube ASAP, but they didn't and they had trouble getting the tube in me") - per pt, she does not have any issues with neck movement, no trouble breathing, no shortness of breath. Per pt she had been fine with anesthesia and easy to intubate other than that one incident.   Patient denies shortness of breath, fever, cough and chest pain at PAT appointment   All instructions explained to the patient, with a verbal understanding of the material. Patient agrees to go over the instructions while at home for a better understanding. Patient also instructed to self quarantine after being tested for COVID-19. The opportunity to ask questions was provided.

## 2020-02-25 ENCOUNTER — Inpatient Hospital Stay (HOSPITAL_COMMUNITY): Payer: Medicare Other | Admitting: Certified Registered"

## 2020-02-25 ENCOUNTER — Inpatient Hospital Stay (HOSPITAL_COMMUNITY): Payer: Medicare Other | Admitting: Physician Assistant

## 2020-02-25 ENCOUNTER — Encounter (HOSPITAL_COMMUNITY)
Admission: RE | Disposition: A | Discharge status: Home / Self Care | Payer: Self-pay | Source: Home / Self Care | Attending: Orthopedic Surgery

## 2020-02-25 ENCOUNTER — Other Ambulatory Visit: Payer: Self-pay

## 2020-02-25 ENCOUNTER — Encounter (HOSPITAL_COMMUNITY): Payer: Self-pay | Admitting: Orthopedic Surgery

## 2020-02-25 ENCOUNTER — Inpatient Hospital Stay (HOSPITAL_COMMUNITY): Payer: Medicare Other

## 2020-02-25 ENCOUNTER — Inpatient Hospital Stay (HOSPITAL_COMMUNITY)
Admission: RE | Admit: 2020-02-25 | Discharge: 2020-02-27 | DRG: 455 | Disposition: A | Discharge status: Home / Self Care | Payer: Medicare Other | Attending: Orthopedic Surgery | Admitting: Orthopedic Surgery

## 2020-02-25 DIAGNOSIS — Z7951 Long term (current) use of inhaled steroids: Secondary | ICD-10-CM

## 2020-02-25 DIAGNOSIS — Z981 Arthrodesis status: Secondary | ICD-10-CM | POA: Diagnosis not present

## 2020-02-25 DIAGNOSIS — J45909 Unspecified asthma, uncomplicated: Secondary | ICD-10-CM | POA: Diagnosis present

## 2020-02-25 DIAGNOSIS — Z91040 Latex allergy status: Secondary | ICD-10-CM | POA: Diagnosis not present

## 2020-02-25 DIAGNOSIS — Z833 Family history of diabetes mellitus: Secondary | ICD-10-CM

## 2020-02-25 DIAGNOSIS — G4730 Sleep apnea, unspecified: Secondary | ICD-10-CM | POA: Diagnosis present

## 2020-02-25 DIAGNOSIS — M62838 Other muscle spasm: Secondary | ICD-10-CM | POA: Diagnosis present

## 2020-02-25 DIAGNOSIS — Z6832 Body mass index (BMI) 32.0-32.9, adult: Secondary | ICD-10-CM

## 2020-02-25 DIAGNOSIS — Z20822 Contact with and (suspected) exposure to covid-19: Secondary | ICD-10-CM | POA: Diagnosis present

## 2020-02-25 DIAGNOSIS — Z794 Long term (current) use of insulin: Secondary | ICD-10-CM | POA: Diagnosis not present

## 2020-02-25 DIAGNOSIS — M48062 Spinal stenosis, lumbar region with neurogenic claudication: Secondary | ICD-10-CM | POA: Diagnosis present

## 2020-02-25 DIAGNOSIS — E669 Obesity, unspecified: Secondary | ICD-10-CM | POA: Diagnosis present

## 2020-02-25 DIAGNOSIS — Z7982 Long term (current) use of aspirin: Secondary | ICD-10-CM

## 2020-02-25 DIAGNOSIS — Z91018 Allergy to other foods: Secondary | ICD-10-CM | POA: Diagnosis not present

## 2020-02-25 DIAGNOSIS — E785 Hyperlipidemia, unspecified: Secondary | ICD-10-CM | POA: Diagnosis present

## 2020-02-25 DIAGNOSIS — Z79890 Hormone replacement therapy (postmenopausal): Secondary | ICD-10-CM | POA: Diagnosis not present

## 2020-02-25 DIAGNOSIS — M1990 Unspecified osteoarthritis, unspecified site: Secondary | ICD-10-CM | POA: Diagnosis present

## 2020-02-25 DIAGNOSIS — Z419 Encounter for procedure for purposes other than remedying health state, unspecified: Secondary | ICD-10-CM

## 2020-02-25 DIAGNOSIS — I10 Essential (primary) hypertension: Secondary | ICD-10-CM | POA: Diagnosis present

## 2020-02-25 DIAGNOSIS — Z882 Allergy status to sulfonamides status: Secondary | ICD-10-CM

## 2020-02-25 DIAGNOSIS — M5410 Radiculopathy, site unspecified: Secondary | ICD-10-CM | POA: Diagnosis present

## 2020-02-25 DIAGNOSIS — Z885 Allergy status to narcotic agent status: Secondary | ICD-10-CM | POA: Diagnosis not present

## 2020-02-25 DIAGNOSIS — Z79899 Other long term (current) drug therapy: Secondary | ICD-10-CM

## 2020-02-25 DIAGNOSIS — E039 Hypothyroidism, unspecified: Secondary | ICD-10-CM | POA: Diagnosis present

## 2020-02-25 DIAGNOSIS — Z880 Allergy status to penicillin: Secondary | ICD-10-CM

## 2020-02-25 DIAGNOSIS — E1151 Type 2 diabetes mellitus with diabetic peripheral angiopathy without gangrene: Secondary | ICD-10-CM | POA: Diagnosis present

## 2020-02-25 HISTORY — PX: ANTERIOR LAT LUMBAR FUSION: SHX1168

## 2020-02-25 LAB — GLUCOSE, CAPILLARY
Glucose-Capillary: 137 mg/dL — ABNORMAL HIGH (ref 70–99)
Glucose-Capillary: 139 mg/dL — ABNORMAL HIGH (ref 70–99)
Glucose-Capillary: 153 mg/dL — ABNORMAL HIGH (ref 70–99)
Glucose-Capillary: 141 mg/dL — ABNORMAL HIGH (ref 70–99)
Glucose-Capillary: 174 mg/dL — ABNORMAL HIGH (ref 70–99)
Glucose-Capillary: 220 mg/dL — ABNORMAL HIGH (ref 70–99)
Glucose-Capillary: 122 mg/dL — ABNORMAL HIGH (ref 70–99)

## 2020-02-25 IMAGING — RF DG C-ARM 1-60 MIN
1 series · 2 of 2 positions shown · non-contrast
Comparison: MR lumbar spine [DATE].

CLINICAL DATA: L2-3 lumbar fusion.

EXAM:
LUMBAR SPINE - 2-3 VIEW; DG C-ARM 1-60 MIN

[Series 1: run · 2 of 2 slices shown]
[im 1/2]
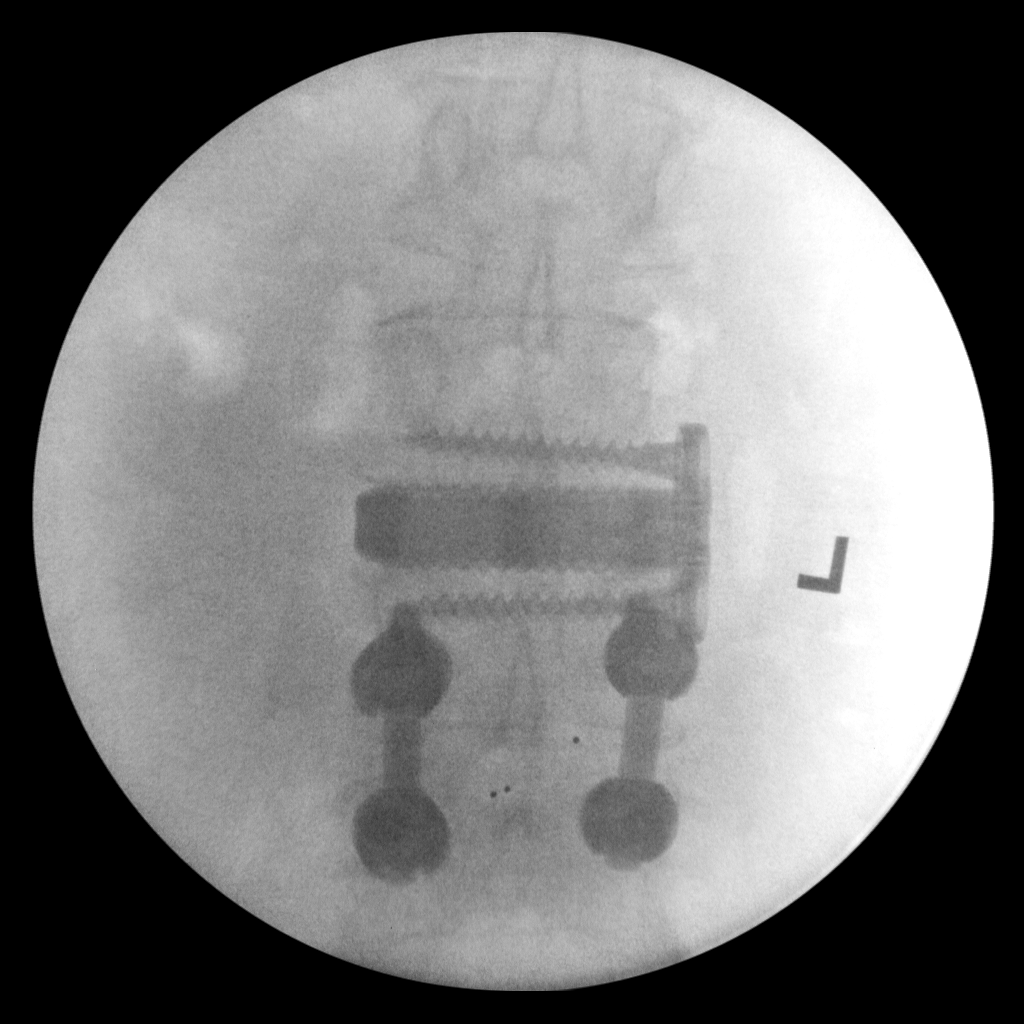
[im 2/2]
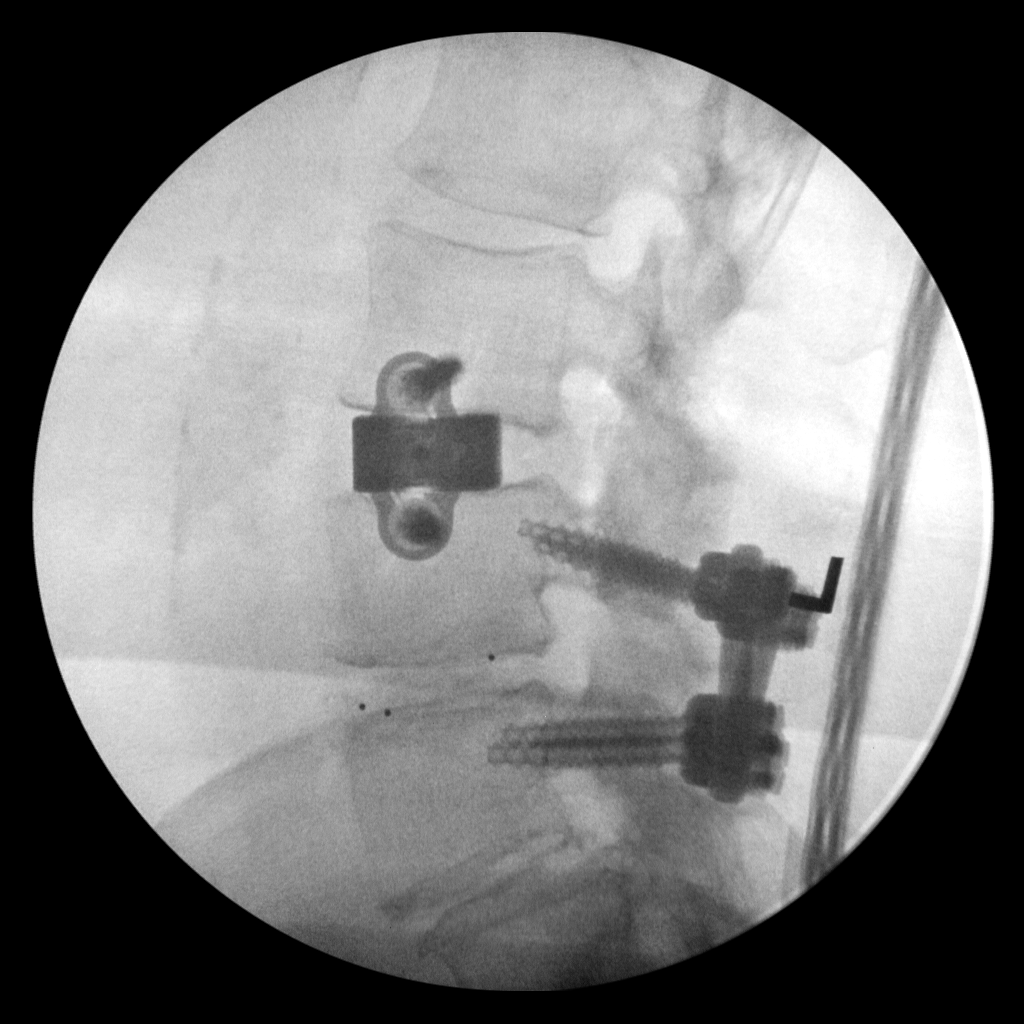

[2 of 2 positions shown; findings below may reference images not displayed]

FINDINGS: Frontal and lateral views of the lumbar spine are submitted.
Numbering system utilized on [DATE] is preserved. Previous L3-4
posterior lumbar interbody fusion with interbody spacer. Interval
left lateral L2-3 fusion with interbody cage.
IMPRESSION: 1. Left lateral L2-3 fusion.
2. Previous L3-4 posterior fusion.

## 2020-02-25 IMAGING — RF Imaging study
1 series · 2 of 2 positions shown · non-contrast
Comparison: [DATE].

CLINICAL DATA: Posttraumatic headache after fall.

EXAM:
CT HEAD WITHOUT CONTRAST
CT CERVICAL SPINE WITHOUT CONTRAST
TECHNIQUE: Multidetector CT imaging of the head and cervical spine was
performed following the standard protocol without intravenous
contrast. Multiplanar CT image reconstructions of the cervical spine
were also generated.

[Series 1: run · 2 of 2 slices shown]
[im 1/2]
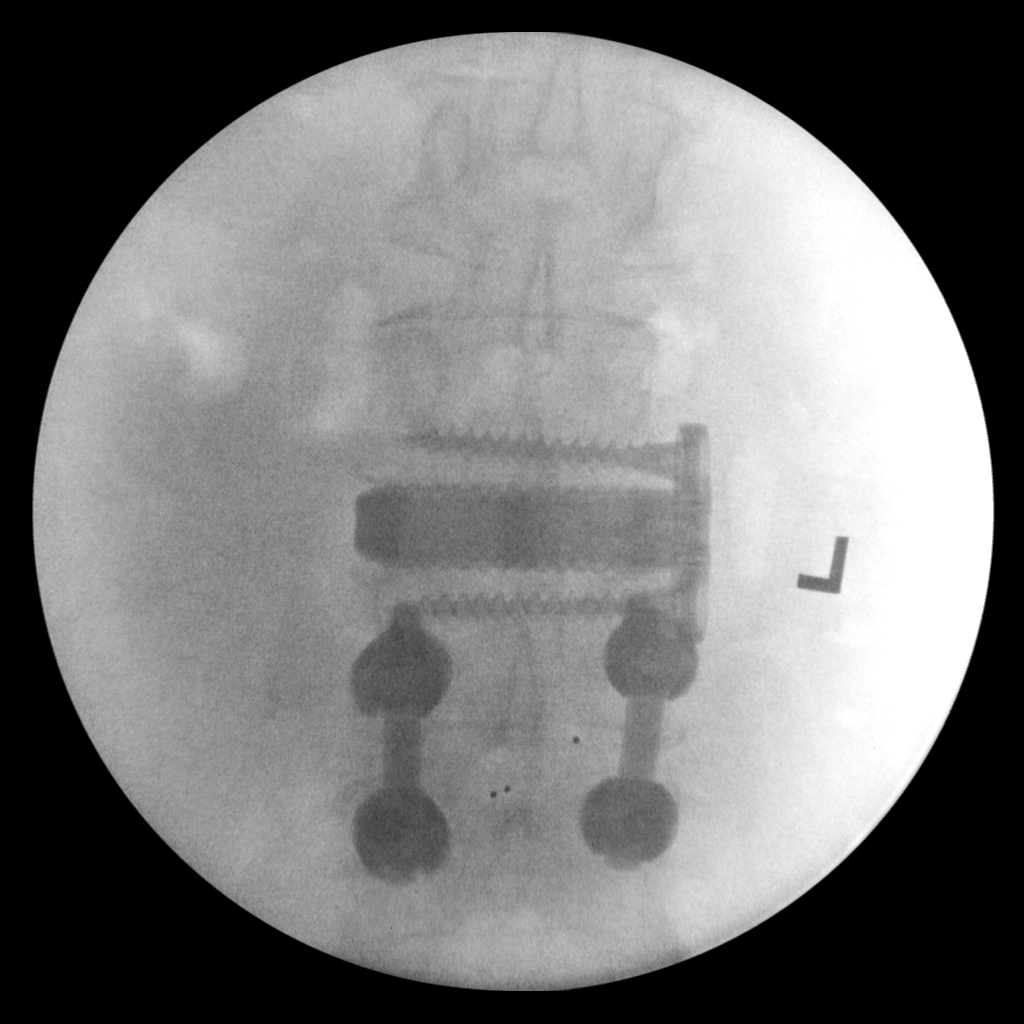
[im 2/2]
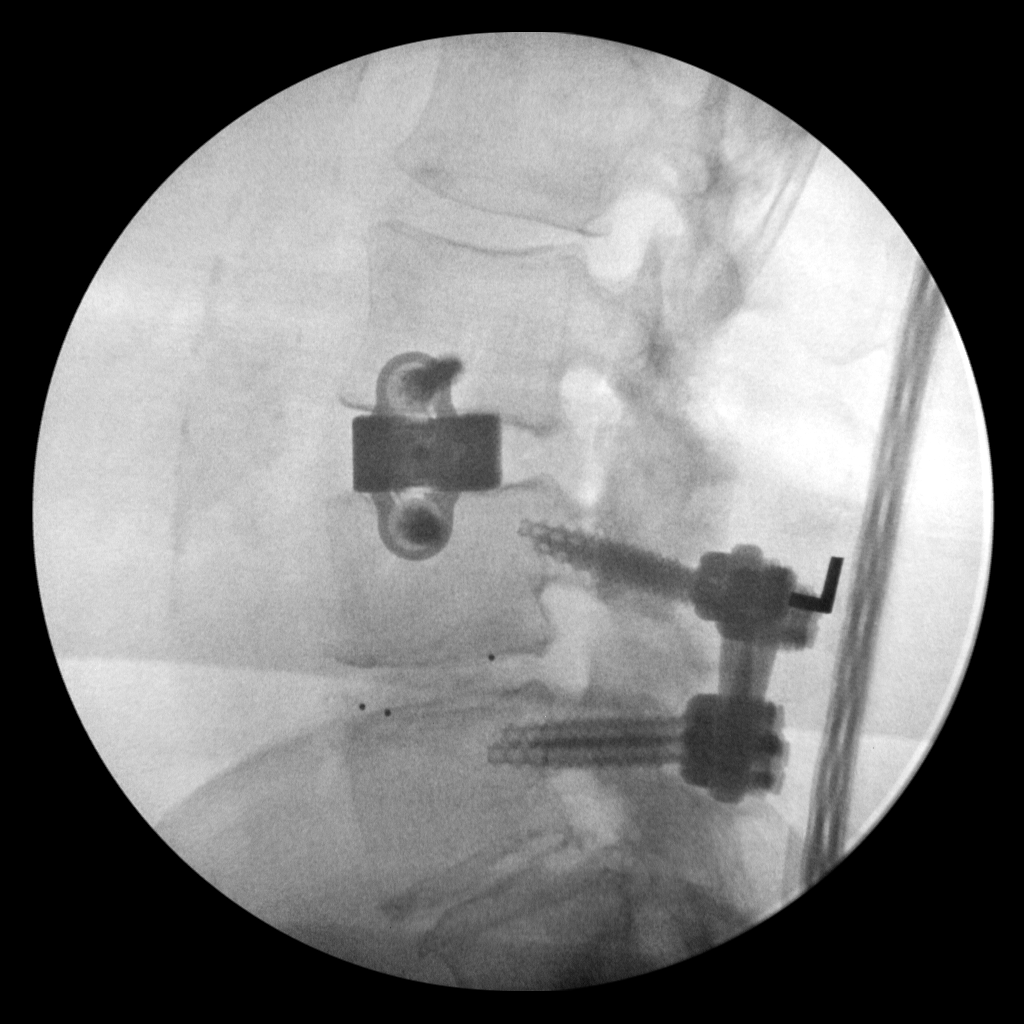

[2 of 2 positions shown; findings below may reference images not displayed]

FINDINGS: CT HEAD FINDINGS

Brain: Mild chronic ischemic white matter disease is noted. No mass
effect or midline shift is noted. Ventricular size is within normal
limits. There is no evidence of mass lesion, hemorrhage or acute
infarction.

Vascular: No hyperdense vessel or unexpected calcification.

Skull: Normal. Negative for fracture or focal lesion.

Sinuses/Orbits: No acute finding.

Other: None.

CT CERVICAL SPINE FINDINGS

Alignment: Normal.

Skull base and vertebrae: No acute fracture. No primary bone lesion
or focal pathologic process.

Soft tissues and spinal canal: No prevertebral fluid or swelling. No
visible canal hematoma.

Disc levels: Moderate degenerative disc disease is noted at C4-5 and
C5-6 with anterior osteophyte formation.

Upper chest: Negative.

Other: Degenerative changes are seen involving the left-sided
posterior facet joints.
IMPRESSION: 1. Mild chronic ischemic white matter disease. No acute intracranial
abnormality seen.
2. Multilevel degenerative disc disease. No acute abnormality seen
in the cervical spine.

## 2020-02-25 IMAGING — RF DG LUMBAR SPINE 2-3V
1 series · 2 of 2 positions shown · non-contrast
Comparison: MR lumbar spine [DATE].

CLINICAL DATA: L2-3 lumbar fusion.

EXAM:
LUMBAR SPINE - 2-3 VIEW; DG C-ARM 1-60 MIN

[Series 1: run · 2 of 2 slices shown]
[im 1/2]
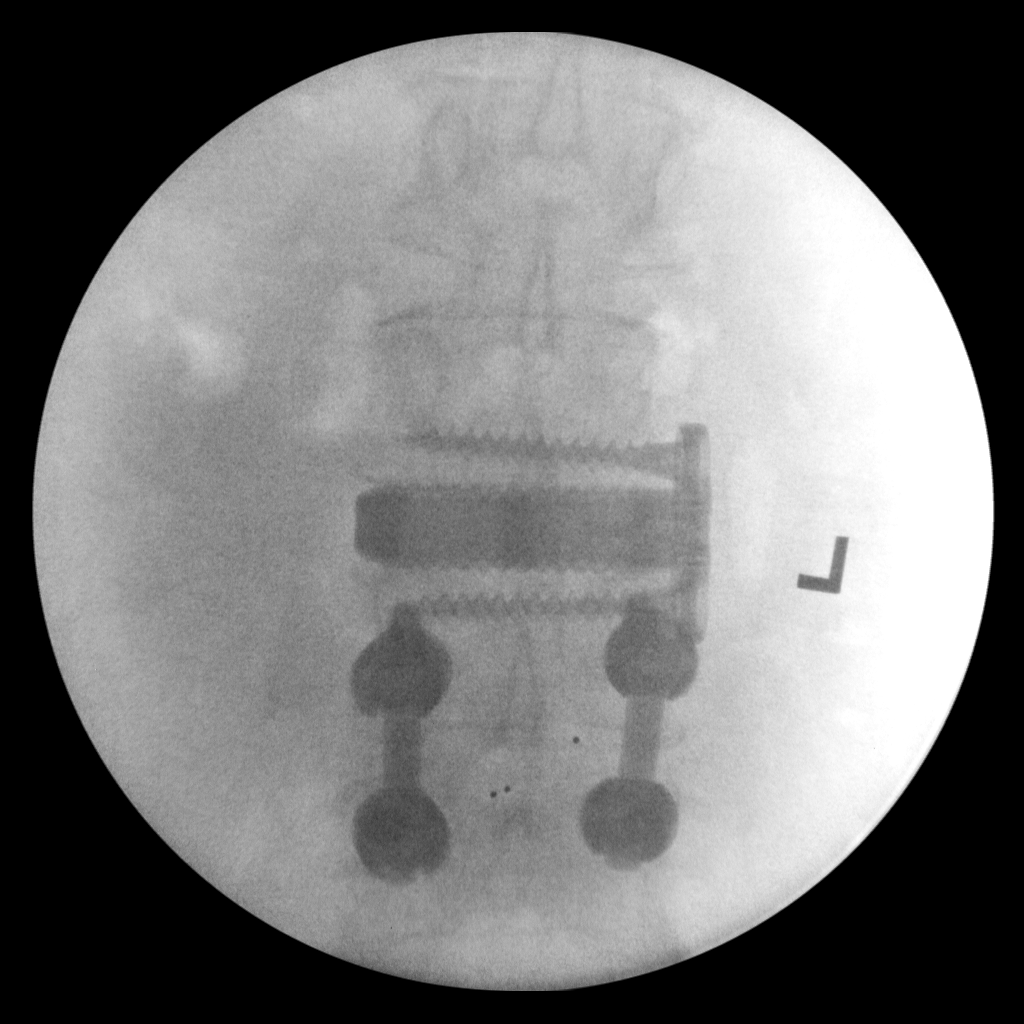
[im 2/2]
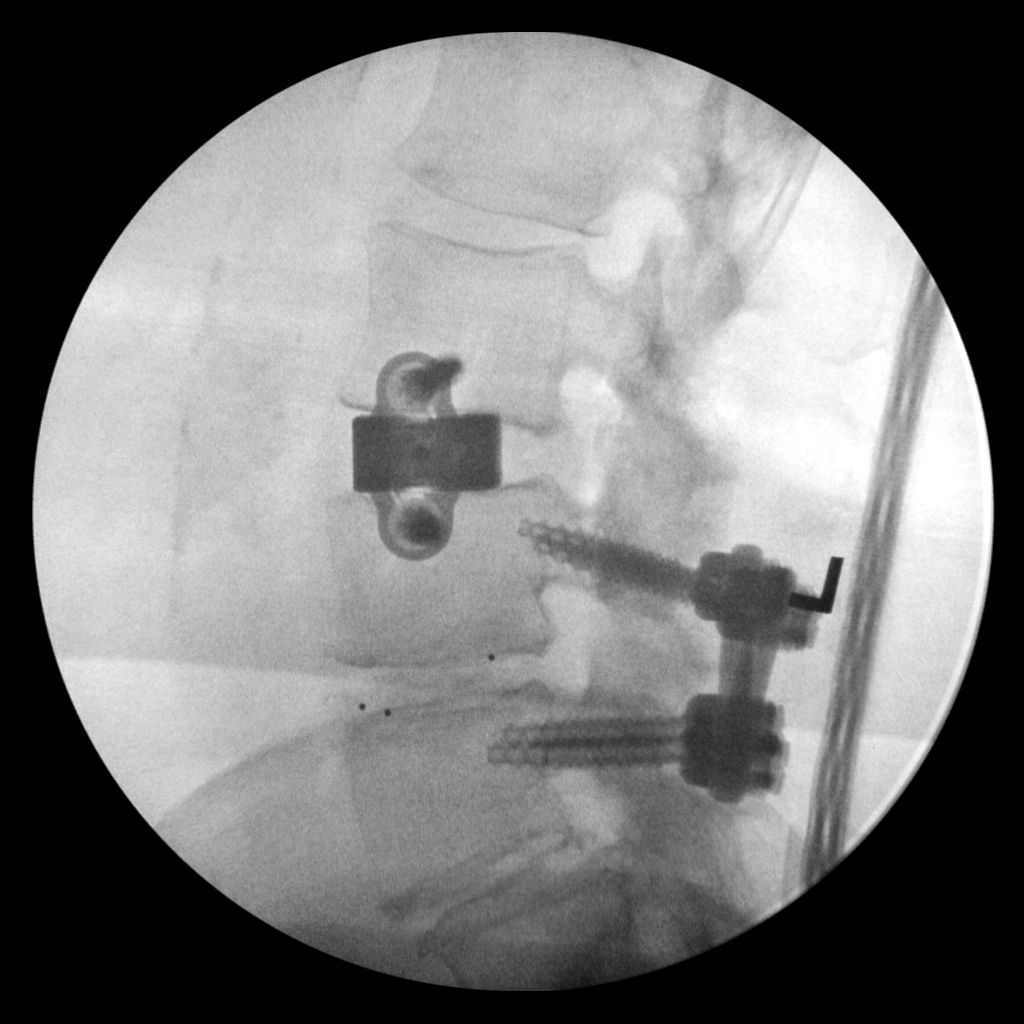

[2 of 2 positions shown; findings below may reference images not displayed]

FINDINGS: Frontal and lateral views of the lumbar spine are submitted.
Numbering system utilized on [DATE] is preserved. Previous L3-4
posterior lumbar interbody fusion with interbody spacer. Interval
left lateral L2-3 fusion with interbody cage.
IMPRESSION: 1. Left lateral L2-3 fusion.
2. Previous L3-4 posterior fusion.

## 2020-02-25 IMAGING — CR DG OR LOCAL ABDOMEN
1 series · 1 of 1 positions shown · non-contrast
Comparison: Intraoperative fluoro films from earlier same day.

CLINICAL DATA: Left-sided lateral interbody fusion L2-3

EXAM:
OR LOCAL ABDOMEN

[AP]
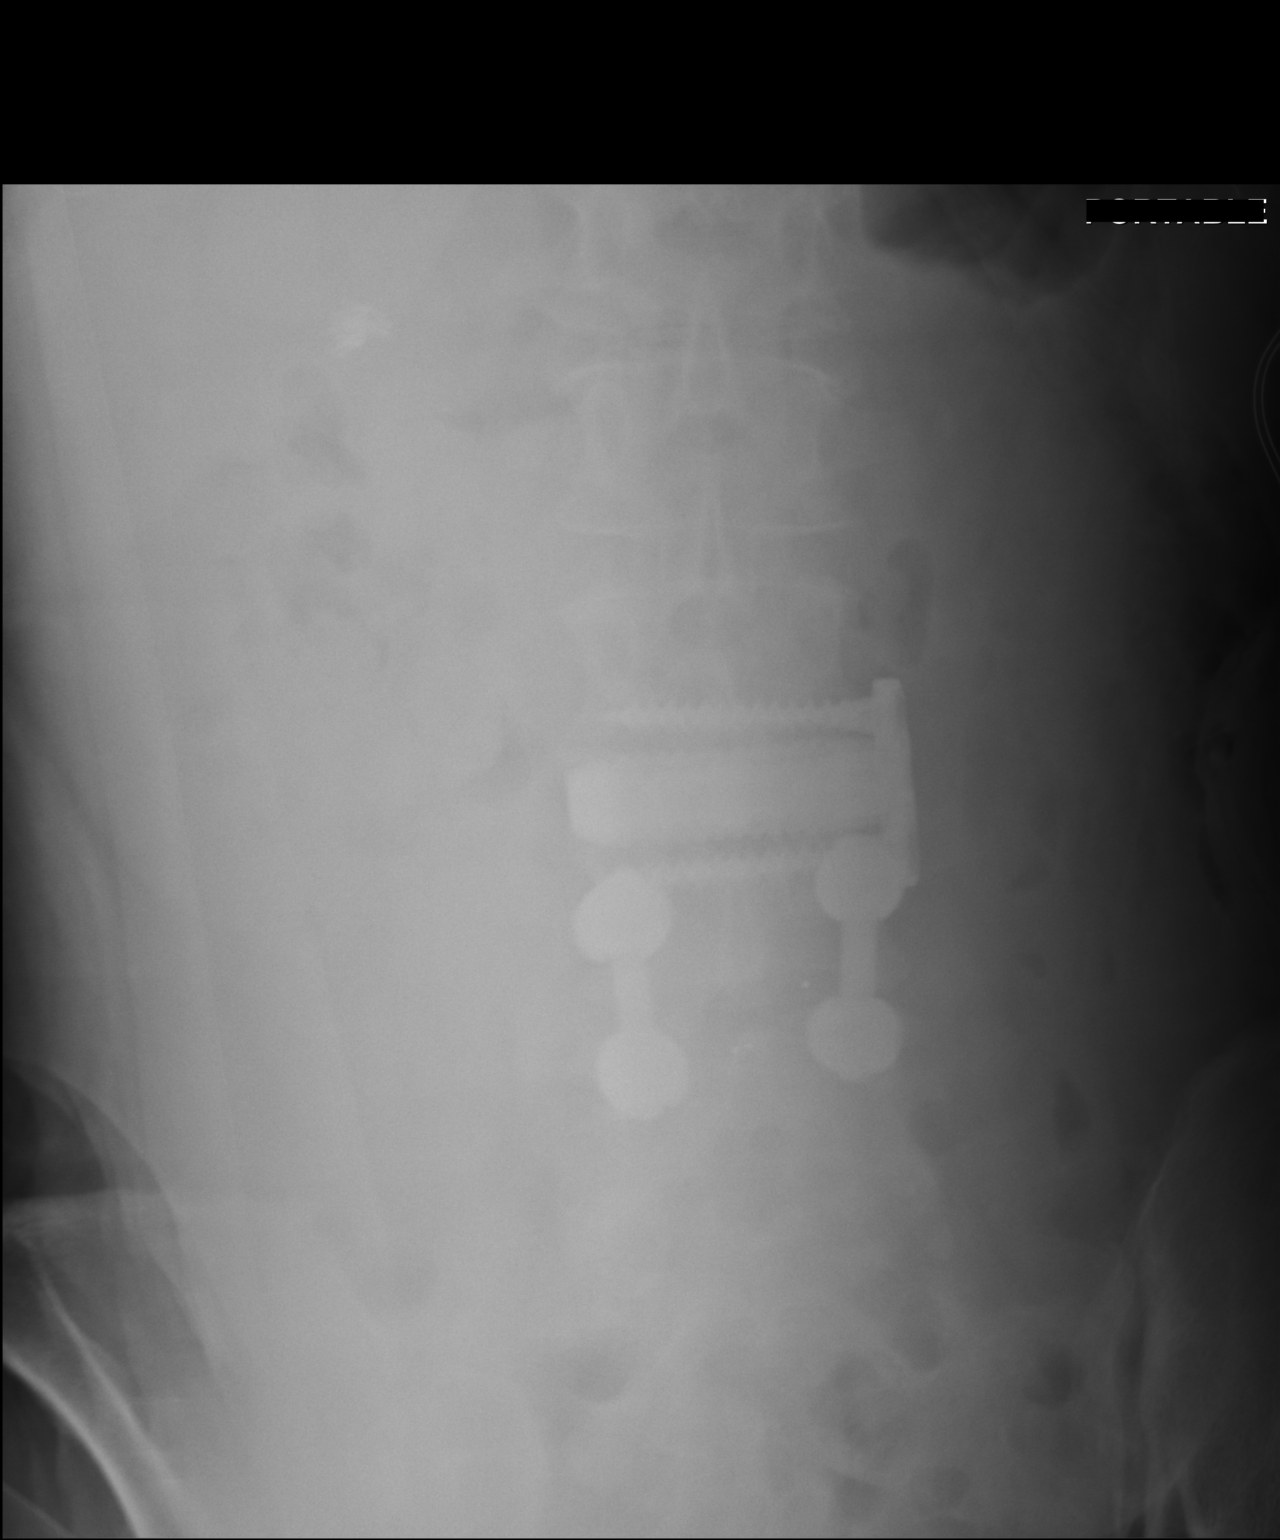

[1 of 1 positions shown; findings below may reference images not displayed]

FINDINGS: PA abdomen shows spinal fusion hardware, stable since intraoperative
study earlier the same day. Surgical clips overlying the right upper
quadrant are compatible with the clinical history of prior
cholecystectomy. Lead wire seen on the extreme left lateral margin
of the film. No unexpected radiopaque foreign body within the
visualized abdomen.
IMPRESSION: Negative. Findings were discussed with KLEOVOULOS, in the operating

## 2020-02-25 SURGERY — ANTERIOR LATERAL LUMBAR FUSION 1 LEVEL
Anesthesia: General | Laterality: Left

## 2020-02-25 MED ORDER — VANCOMYCIN HCL IN DEXTROSE 1-5 GM/200ML-% IV SOLN
1000.0000 mg | Freq: Once | INTRAVENOUS | Status: AC
  Administered 2020-02-25: 1000 mg via INTRAVENOUS
  Filled 2020-02-25: qty 200

## 2020-02-25 MED ORDER — SUCCINYLCHOLINE CHLORIDE 200 MG/10ML IV SOSY
PREFILLED_SYRINGE | INTRAVENOUS | Status: AC
  Filled 2020-02-25: qty 10

## 2020-02-25 MED ORDER — BUPIVACAINE HCL (PF) 0.25 % IJ SOLN
INTRAMUSCULAR | Status: AC
  Filled 2020-02-25: qty 30

## 2020-02-25 MED ORDER — 0.9 % SODIUM CHLORIDE (POUR BTL) OPTIME
TOPICAL | Status: DC | PRN
  Administered 2020-02-25 (×2): 1000 mL

## 2020-02-25 MED ORDER — OXYCODONE HCL 5 MG PO TABS
ORAL_TABLET | ORAL | Status: AC
  Administered 2020-02-25: 5 mg
  Filled 2020-02-25: qty 1

## 2020-02-25 MED ORDER — PHENYLEPHRINE HCL-NACL 10-0.9 MG/250ML-% IV SOLN
INTRAVENOUS | Status: DC | PRN
  Administered 2020-02-25: 50 ug/min via INTRAVENOUS

## 2020-02-25 MED ORDER — THROMBIN (RECOMBINANT) 20000 UNITS EX SOLR
CUTANEOUS | Status: AC
  Filled 2020-02-25: qty 20000

## 2020-02-25 MED ORDER — PROPOFOL 10 MG/ML IV BOLUS
INTRAVENOUS | Status: AC
  Filled 2020-02-25: qty 20

## 2020-02-25 MED ORDER — FERROUS SULFATE 325 (65 FE) MG PO TABS
325.0000 mg | ORAL_TABLET | Freq: Every day | ORAL | Status: DC
  Administered 2020-02-25 – 2020-02-26 (×2): 325 mg via ORAL
  Filled 2020-02-25 (×2): qty 1

## 2020-02-25 MED ORDER — FENOFIBRATE 160 MG PO TABS
160.0000 mg | ORAL_TABLET | Freq: Every day | ORAL | Status: DC
  Administered 2020-02-25 – 2020-02-26 (×2): 160 mg via ORAL
  Filled 2020-02-25 (×3): qty 1

## 2020-02-25 MED ORDER — EPINEPHRINE PF 1 MG/ML IJ SOLN
INTRAMUSCULAR | Status: DC | PRN
  Administered 2020-02-25: .15 mL

## 2020-02-25 MED ORDER — PHENYLEPHRINE 40 MCG/ML (10ML) SYRINGE FOR IV PUSH (FOR BLOOD PRESSURE SUPPORT)
PREFILLED_SYRINGE | INTRAVENOUS | Status: DC | PRN
  Administered 2020-02-25: 80 ug via INTRAVENOUS
  Administered 2020-02-25: 120 ug via INTRAVENOUS
  Administered 2020-02-25: 200 ug via INTRAVENOUS

## 2020-02-25 MED ORDER — IRBESARTAN 150 MG PO TABS
150.0000 mg | ORAL_TABLET | Freq: Every day | ORAL | Status: DC
  Administered 2020-02-25 – 2020-02-26 (×2): 150 mg via ORAL
  Filled 2020-02-25 (×3): qty 1

## 2020-02-25 MED ORDER — LACTATED RINGERS IV SOLN: INTRAVENOUS | Status: DC | PRN

## 2020-02-25 MED ORDER — MIDAZOLAM HCL 2 MG/2ML IJ SOLN
INTRAMUSCULAR | Status: AC
  Filled 2020-02-25: qty 2

## 2020-02-25 MED ORDER — MONTELUKAST SODIUM 10 MG PO TABS
10.0000 mg | ORAL_TABLET | Freq: Every day | ORAL | Status: DC
  Administered 2020-02-25 – 2020-02-26 (×2): 10 mg via ORAL
  Filled 2020-02-25 (×2): qty 1

## 2020-02-25 MED ORDER — FENTANYL CITRATE (PF) 250 MCG/5ML IJ SOLN
INTRAMUSCULAR | Status: AC
  Filled 2020-02-25: qty 5

## 2020-02-25 MED ORDER — PROPOFOL 10 MG/ML IV BOLUS
INTRAVENOUS | Status: DC | PRN
  Administered 2020-02-25: 200 mg via INTRAVENOUS

## 2020-02-25 MED ORDER — SODIUM CHLORIDE 0.9% FLUSH
3.0000 mL | Freq: Two times a day (BID) | INTRAVENOUS | Status: DC
  Administered 2020-02-25 – 2020-02-26 (×3): 3 mL via INTRAVENOUS

## 2020-02-25 MED ORDER — PHENOL 1.4 % MT LIQD: 1.0000 | OROMUCOSAL | Status: DC | PRN

## 2020-02-25 MED ORDER — ROCURONIUM BROMIDE 10 MG/ML (PF) SYRINGE
PREFILLED_SYRINGE | INTRAVENOUS | Status: AC
  Filled 2020-02-25: qty 10

## 2020-02-25 MED ORDER — ALUM & MAG HYDROXIDE-SIMETH 200-200-20 MG/5ML PO SUSP: 30.0000 mL | Freq: Four times a day (QID) | ORAL | Status: DC | PRN

## 2020-02-25 MED ORDER — ONDANSETRON HCL 4 MG/2ML IJ SOLN
INTRAMUSCULAR | Status: AC
  Filled 2020-02-25: qty 2

## 2020-02-25 MED ORDER — DIAZEPAM 5 MG PO TABS
5.0000 mg | ORAL_TABLET | Freq: Four times a day (QID) | ORAL | Status: DC | PRN
  Administered 2020-02-25 – 2020-02-27 (×6): 5 mg via ORAL
  Filled 2020-02-25 (×5): qty 1

## 2020-02-25 MED ORDER — ZOLPIDEM TARTRATE 5 MG PO TABS: 5.0000 mg | ORAL_TABLET | Freq: Every evening | ORAL | Status: DC | PRN

## 2020-02-25 MED ORDER — DEXAMETHASONE SODIUM PHOSPHATE 10 MG/ML IJ SOLN
INTRAMUSCULAR | Status: DC | PRN
  Administered 2020-02-25: 5 mg via INTRAVENOUS

## 2020-02-25 MED ORDER — OXYCODONE-ACETAMINOPHEN 5-325 MG PO TABS
1.0000 | ORAL_TABLET | ORAL | Status: DC | PRN
  Administered 2020-02-25 – 2020-02-27 (×8): 2 via ORAL
  Filled 2020-02-25 (×9): qty 2

## 2020-02-25 MED ORDER — METFORMIN HCL 500 MG PO TABS
1000.0000 mg | ORAL_TABLET | Freq: Two times a day (BID) | ORAL | Status: DC
  Administered 2020-02-25 – 2020-02-27 (×3): 1000 mg via ORAL
  Filled 2020-02-25 (×3): qty 2

## 2020-02-25 MED ORDER — PROPOFOL 500 MG/50ML IV EMUL
INTRAVENOUS | Status: DC | PRN
  Administered 2020-02-25: 50 ug/kg/min via INTRAVENOUS

## 2020-02-25 MED ORDER — SEMAGLUTIDE (1 MG/DOSE) 2 MG/1.5ML ~~LOC~~ SOPN: 1.0000 mg | PEN_INJECTOR | SUBCUTANEOUS | Status: DC

## 2020-02-25 MED ORDER — CHLORHEXIDINE GLUCONATE 0.12 % MT SOLN
15.0000 mL | Freq: Once | OROMUCOSAL | Status: AC
  Administered 2020-02-25: 15 mL via OROMUCOSAL
  Filled 2020-02-25: qty 15

## 2020-02-25 MED ORDER — AMLODIPINE BESYLATE 5 MG PO TABS
5.0000 mg | ORAL_TABLET | Freq: Every day | ORAL | Status: DC
  Administered 2020-02-26 (×2): 5 mg via ORAL
  Filled 2020-02-25 (×3): qty 1

## 2020-02-25 MED ORDER — SENNOSIDES-DOCUSATE SODIUM 8.6-50 MG PO TABS: 1.0000 | ORAL_TABLET | Freq: Every evening | ORAL | Status: DC | PRN

## 2020-02-25 MED ORDER — LEVOTHYROXINE SODIUM 25 MCG PO TABS
125.0000 ug | ORAL_TABLET | Freq: Every day | ORAL | Status: DC
  Administered 2020-02-26 – 2020-02-27 (×2): 125 ug via ORAL
  Filled 2020-02-25 (×2): qty 1

## 2020-02-25 MED ORDER — FLUTICASONE PROPIONATE 50 MCG/ACT NA SUSP
1.0000 | Freq: Every day | NASAL | Status: DC
  Filled 2020-02-25: qty 16

## 2020-02-25 MED ORDER — MELATONIN 5 MG PO TABS
10.0000 mg | ORAL_TABLET | Freq: Every day | ORAL | Status: DC
  Administered 2020-02-25 – 2020-02-26 (×2): 10 mg via ORAL
  Filled 2020-02-25 (×2): qty 2

## 2020-02-25 MED ORDER — SODIUM CHLORIDE 0.9% FLUSH: 3.0000 mL | INTRAVENOUS | Status: DC | PRN

## 2020-02-25 MED ORDER — MIDAZOLAM HCL 2 MG/2ML IJ SOLN
INTRAMUSCULAR | Status: DC | PRN
  Administered 2020-02-25: 2 mg via INTRAVENOUS

## 2020-02-25 MED ORDER — DOCUSATE SODIUM 100 MG PO CAPS
100.0000 mg | ORAL_CAPSULE | Freq: Two times a day (BID) | ORAL | Status: DC
  Administered 2020-02-25 – 2020-02-27 (×5): 100 mg via ORAL
  Filled 2020-02-25 (×5): qty 1

## 2020-02-25 MED ORDER — INSULIN GLARGINE 100 UNIT/ML ~~LOC~~ SOLN
30.0000 [IU] | Freq: Every day | SUBCUTANEOUS | Status: DC
  Administered 2020-02-25 – 2020-02-26 (×2): 30 [IU] via SUBCUTANEOUS
  Filled 2020-02-25 (×3): qty 0.3

## 2020-02-25 MED ORDER — INSULIN ASPART 100 UNIT/ML ~~LOC~~ SOLN
0.0000 [IU] | Freq: Three times a day (TID) | SUBCUTANEOUS | Status: DC
  Administered 2020-02-26 (×2): 5 [IU] via SUBCUTANEOUS

## 2020-02-25 MED ORDER — ONDANSETRON HCL 4 MG/2ML IJ SOLN: 4.0000 mg | Freq: Four times a day (QID) | INTRAMUSCULAR | Status: DC | PRN

## 2020-02-25 MED ORDER — CALCIUM CARBONATE-VITAMIN D 500-200 MG-UNIT PO TABS
1.0000 | ORAL_TABLET | Freq: Two times a day (BID) | ORAL | Status: DC
  Administered 2020-02-25 – 2020-02-27 (×5): 1 via ORAL
  Filled 2020-02-25 (×5): qty 1

## 2020-02-25 MED ORDER — INSULIN REGULAR(HUMAN) IN NACL 100-0.9 UT/100ML-% IV SOLN
INTRAVENOUS | Status: DC | PRN
  Administered 2020-02-25: 5.5 [IU]/h via INTRAVENOUS

## 2020-02-25 MED ORDER — DIAZEPAM 5 MG PO TABS
ORAL_TABLET | ORAL | Status: AC
  Filled 2020-02-25: qty 1

## 2020-02-25 MED ORDER — MORPHINE SULFATE (PF) 2 MG/ML IV SOLN
INTRAVENOUS | Status: AC
  Filled 2020-02-25: qty 1

## 2020-02-25 MED ORDER — PROPOFOL 1000 MG/100ML IV EMUL
INTRAVENOUS | Status: AC
  Filled 2020-02-25: qty 100

## 2020-02-25 MED ORDER — PHENYLEPHRINE 40 MCG/ML (10ML) SYRINGE FOR IV PUSH (FOR BLOOD PRESSURE SUPPORT)
PREFILLED_SYRINGE | INTRAVENOUS | Status: AC
  Filled 2020-02-25: qty 10

## 2020-02-25 MED ORDER — EPINEPHRINE PF 1 MG/ML IJ SOLN
INTRAMUSCULAR | Status: AC
  Filled 2020-02-25: qty 1

## 2020-02-25 MED ORDER — GLIMEPIRIDE 2 MG PO TABS
4.0000 mg | ORAL_TABLET | Freq: Two times a day (BID) | ORAL | Status: DC
  Administered 2020-02-25 – 2020-02-27 (×4): 4 mg via ORAL
  Filled 2020-02-25: qty 1
  Filled 2020-02-25 (×3): qty 2
  Filled 2020-02-25: qty 1
  Filled 2020-02-25: qty 2

## 2020-02-25 MED ORDER — CHLORHEXIDINE GLUCONATE CLOTH 2 % EX PADS
6.0000 | MEDICATED_PAD | Freq: Every day | CUTANEOUS | Status: DC
  Administered 2020-02-25 – 2020-02-27 (×2): 6 via TOPICAL

## 2020-02-25 MED ORDER — SODIUM CHLORIDE 0.9 % IV SOLN: 250.0000 mL | INTRAVENOUS | Status: DC

## 2020-02-25 MED ORDER — THROMBIN 20000 UNITS EX SOLR
CUTANEOUS | Status: DC | PRN
  Administered 2020-02-25: 20 mL via TOPICAL

## 2020-02-25 MED ORDER — SUCCINYLCHOLINE CHLORIDE 20 MG/ML IJ SOLN
INTRAMUSCULAR | Status: DC | PRN
  Administered 2020-02-25: 140 mg via INTRAVENOUS

## 2020-02-25 MED ORDER — ORAL CARE MOUTH RINSE: 15.0000 mL | Freq: Once | OROMUCOSAL | Status: AC

## 2020-02-25 MED ORDER — FUROSEMIDE 20 MG PO TABS: 20.0000 mg | ORAL_TABLET | Freq: Every day | ORAL | Status: DC | PRN

## 2020-02-25 MED ORDER — ONDANSETRON HCL 4 MG/2ML IJ SOLN
INTRAMUSCULAR | Status: DC | PRN
  Administered 2020-02-25: 4 mg via INTRAVENOUS

## 2020-02-25 MED ORDER — MENTHOL 3 MG MT LOZG: 1.0000 | LOZENGE | OROMUCOSAL | Status: DC | PRN

## 2020-02-25 MED ORDER — DIPHENHYDRAMINE HCL 25 MG PO CAPS: 25.0000 mg | ORAL_CAPSULE | Freq: Three times a day (TID) | ORAL | Status: DC | PRN

## 2020-02-25 MED ORDER — ONDANSETRON HCL 4 MG PO TABS: 4.0000 mg | ORAL_TABLET | Freq: Four times a day (QID) | ORAL | Status: DC | PRN

## 2020-02-25 MED ORDER — LACTATED RINGERS IV SOLN: INTRAVENOUS | Status: DC

## 2020-02-25 MED ORDER — POTASSIUM CHLORIDE IN NACL 20-0.9 MEQ/L-% IV SOLN: INTRAVENOUS | Status: DC

## 2020-02-25 MED ORDER — BUPIVACAINE LIPOSOME 1.3 % IJ SUSP
20.0000 mL | Freq: Once | INTRAMUSCULAR | Status: DC
  Filled 2020-02-25: qty 20

## 2020-02-25 MED ORDER — INSULIN ASPART 100 UNIT/ML ~~LOC~~ SOLN: 1.0000 [IU] | Freq: Every day | SUBCUTANEOUS | Status: DC | PRN

## 2020-02-25 MED ORDER — ATORVASTATIN CALCIUM 40 MG PO TABS
40.0000 mg | ORAL_TABLET | Freq: Every day | ORAL | Status: DC
  Administered 2020-02-25 – 2020-02-26 (×2): 40 mg via ORAL
  Filled 2020-02-25 (×2): qty 1

## 2020-02-25 MED ORDER — SPIRONOLACTONE 25 MG PO TABS
50.0000 mg | ORAL_TABLET | Freq: Every day | ORAL | Status: DC
  Administered 2020-02-25 – 2020-02-27 (×3): 50 mg via ORAL
  Filled 2020-02-25 (×3): qty 2

## 2020-02-25 MED ORDER — BISACODYL 5 MG PO TBEC: 5.0000 mg | DELAYED_RELEASE_TABLET | Freq: Every day | ORAL | Status: DC | PRN

## 2020-02-25 MED ORDER — ADULT MULTIVITAMIN W/MINERALS CH
1.0000 | ORAL_TABLET | Freq: Every day | ORAL | Status: DC
  Administered 2020-02-25 – 2020-02-26 (×2): 1 via ORAL
  Filled 2020-02-25: qty 1

## 2020-02-25 MED ORDER — DEXAMETHASONE SODIUM PHOSPHATE 10 MG/ML IJ SOLN
INTRAMUSCULAR | Status: AC
  Filled 2020-02-25: qty 1

## 2020-02-25 MED ORDER — MORPHINE SULFATE (PF) 2 MG/ML IV SOLN
1.0000 mg | INTRAVENOUS | Status: DC | PRN
  Administered 2020-02-25 (×2): 1 mg via INTRAVENOUS
  Administered 2020-02-26: 2 mg via INTRAVENOUS
  Filled 2020-02-25: qty 1

## 2020-02-25 MED ORDER — LIDOCAINE 2% (20 MG/ML) 5 ML SYRINGE
INTRAMUSCULAR | Status: DC | PRN
  Administered 2020-02-25: 100 mg via INTRAVENOUS

## 2020-02-25 MED ORDER — LIDOCAINE 2% (20 MG/ML) 5 ML SYRINGE
INTRAMUSCULAR | Status: AC
  Filled 2020-02-25: qty 5

## 2020-02-25 MED ORDER — ACETAMINOPHEN 650 MG RE SUPP: 650.0000 mg | RECTAL | Status: DC | PRN

## 2020-02-25 MED ORDER — POVIDONE-IODINE 7.5 % EX SOLN: Freq: Once | CUTANEOUS | Status: AC

## 2020-02-25 MED ORDER — BUPIVACAINE LIPOSOME 1.3 % IJ SUSP: INTRAMUSCULAR | Status: DC | PRN

## 2020-02-25 MED ORDER — FENTANYL CITRATE (PF) 100 MCG/2ML IJ SOLN
INTRAMUSCULAR | Status: DC | PRN
  Administered 2020-02-25: 50 ug via INTRAVENOUS
  Administered 2020-02-25: 100 ug via INTRAVENOUS
  Administered 2020-02-25 (×2): 50 ug via INTRAVENOUS

## 2020-02-25 MED ORDER — BUPIVACAINE HCL (PF) 0.25 % IJ SOLN
INTRAMUSCULAR | Status: DC | PRN
  Administered 2020-02-25: 30 mL

## 2020-02-25 MED ORDER — ACETAMINOPHEN 325 MG PO TABS: 650.0000 mg | ORAL_TABLET | ORAL | Status: DC | PRN

## 2020-02-25 MED ORDER — VANCOMYCIN HCL IN DEXTROSE 1-5 GM/200ML-% IV SOLN
1000.0000 mg | INTRAVENOUS | Status: AC
  Administered 2020-02-25: 1000 mg via INTRAVENOUS
  Filled 2020-02-25: qty 200

## 2020-02-25 MED ORDER — FLEET ENEMA 7-19 GM/118ML RE ENEM: 1.0000 | ENEMA | Freq: Once | RECTAL | Status: DC | PRN

## 2020-02-25 MED ORDER — METHYLENE BLUE 0.5 % INJ SOLN
INTRAVENOUS | Status: AC
  Filled 2020-02-25: qty 10

## 2020-02-25 MED ORDER — MOMETASONE FURO-FORMOTEROL FUM 200-5 MCG/ACT IN AERO
2.0000 | INHALATION_SPRAY | Freq: Two times a day (BID) | RESPIRATORY_TRACT | Status: DC
  Administered 2020-02-25 – 2020-02-26 (×2): 2 via RESPIRATORY_TRACT
  Filled 2020-02-25: qty 8.8

## 2020-02-25 SURGICAL SUPPLY — 75 items
BATTALION LLIF ITRADISCAL SHIM (MISCELLANEOUS) ×3
BENZOIN TINCTURE PRP APPL 2/3 (GAUZE/BANDAGES/DRESSINGS) ×3 IMPLANT
BLADE CLIPPER SURG (BLADE) IMPLANT
BLADE SURG 10 STRL SS (BLADE) ×3 IMPLANT
BONE VIVIGEN FORMABLE 5.4CC (Bone Implant) ×3 IMPLANT
CLIP SPRING STIM LLIF SAFEOP (CLIP) ×3 IMPLANT
CLOSURE WOUND 1/2 X4 (GAUZE/BANDAGES/DRESSINGS) ×1
COVER BACK TABLE 80X110 HD (DRAPES) IMPLANT
COVER SURGICAL LIGHT HANDLE (MISCELLANEOUS) ×3 IMPLANT
COVER WAND RF STERILE (DRAPES) IMPLANT
DILATOR INSULATED LLIF 8-13-18 (NEUROSURGERY SUPPLIES) ×3 IMPLANT
DRAPE C-ARM 42X72 X-RAY (DRAPES) ×3 IMPLANT
DRAPE C-ARMOR (DRAPES) ×3 IMPLANT
DRAPE POUCH INSTRU U-SHP 10X18 (DRAPES) ×3 IMPLANT
DRAPE SURG 17X23 STRL (DRAPES) ×12 IMPLANT
DURAPREP 26ML APPLICATOR (WOUND CARE) ×3 IMPLANT
ELECT BLADE 6.5 EXT (BLADE) ×3 IMPLANT
ELECT CAUTERY BLADE 6.4 (BLADE) ×3 IMPLANT
ELECT KIT SAFEOP EMG/NMJ (KITS) ×3
ELECT REM PT RETURN 9FT ADLT (ELECTROSURGICAL) ×3
ELECTRODE REM PT RTRN 9FT ADLT (ELECTROSURGICAL) ×1 IMPLANT
FEE INTRAOP MONITOR IMPULS NCS (MISCELLANEOUS) ×1 IMPLANT
GAUZE 4X4 16PLY RFD (DISPOSABLE) ×3 IMPLANT
GAUZE SPONGE 4X4 12PLY STRL (GAUZE/BANDAGES/DRESSINGS) ×3 IMPLANT
GLOVE BIO SURGEON STRL SZ7 (GLOVE) ×6 IMPLANT
GLOVE BIO SURGEON STRL SZ8 (GLOVE) ×3 IMPLANT
GLOVE BIOGEL PI IND STRL 7.0 (GLOVE) ×1 IMPLANT
GLOVE BIOGEL PI IND STRL 8 (GLOVE) ×1 IMPLANT
GLOVE BIOGEL PI INDICATOR 7.0 (GLOVE) ×2
GLOVE BIOGEL PI INDICATOR 8 (GLOVE) ×2
GOWN STRL REUS W/ TWL LRG LVL3 (GOWN DISPOSABLE) ×1 IMPLANT
GOWN STRL REUS W/ TWL XL LVL3 (GOWN DISPOSABLE) ×2 IMPLANT
GOWN STRL REUS W/TWL LRG LVL3 (GOWN DISPOSABLE) ×3
GOWN STRL REUS W/TWL XL LVL3 (GOWN DISPOSABLE) ×6
INTRAOP MONITOR FEE IMPULS NCS (MISCELLANEOUS) ×1
INTRAOP MONITOR FEE IMPULSE (MISCELLANEOUS) ×3
IV CATH 14GX2 1/4 (CATHETERS) ×3 IMPLANT
KIT BASIN OR (CUSTOM PROCEDURE TRAY) ×3 IMPLANT
KIT EMG SRFC ELECT (KITS) ×1 IMPLANT
KIT TURNOVER KIT B (KITS) ×3 IMPLANT
KNIFE ANNULOTOMY RETRAC BLK (BLADE) ×3 IMPLANT
MARKER SKIN DUAL TIP RULER LAB (MISCELLANEOUS) ×3 IMPLANT
NEEDLE HYPO 25GX1X1/2 BEV (NEEDLE) ×3 IMPLANT
NEEDLE SPNL 18GX3.5 QUINCKE PK (NEEDLE) ×3 IMPLANT
NS IRRIG 1000ML POUR BTL (IV SOLUTION) ×6 IMPLANT
PACK LAMINECTOMY ORTHO (CUSTOM PROCEDURE TRAY) ×3 IMPLANT
PACK UNIVERSAL I (CUSTOM PROCEDURE TRAY) ×3 IMPLANT
PAD ARMBOARD 7.5X6 YLW CONV (MISCELLANEOUS) ×6 IMPLANT
PLATE LIF AMP 2/SCRW 10/15 (Plate) ×3 IMPLANT
PROBE BALL TIP LLIF SAFEOP (NEUROSURGERY SUPPLIES) ×3 IMPLANT
PROBE PED SCREW MONOP 3 BT NCS (MISCELLANEOUS) ×1 IMPLANT
SCREW LIF AMP 5.5X45 BONE X2 (Screw) ×3 IMPLANT
SCREW PROBE PEDICLE MONOPOLAR (MISCELLANEOUS) ×3
SHIM ITRADISCAL BATTALION LLIF (MISCELLANEOUS) ×1 IMPLANT
SPACER LIF IDENTITI 12X22X50 0 (Spacer) ×3 IMPLANT
SPONGE INTESTINAL PEANUT (DISPOSABLE) ×6 IMPLANT
SPONGE LAP 4X18 RFD (DISPOSABLE) ×3 IMPLANT
SPONGE SURGIFOAM ABS GEL 100 (HEMOSTASIS) ×3 IMPLANT
STAPLER VISISTAT 35W (STAPLE) ×3 IMPLANT
STRIP CLOSURE SKIN 1/2X4 (GAUZE/BANDAGES/DRESSINGS) ×2 IMPLANT
SURGIFLO W/THROMBIN 8M KIT (HEMOSTASIS) IMPLANT
SUT MNCRL AB 4-0 PS2 18 (SUTURE) ×3 IMPLANT
SUT VIC AB 0 CT1 18XCR BRD 8 (SUTURE) ×1 IMPLANT
SUT VIC AB 0 CT1 8-18 (SUTURE) ×3
SUT VIC AB 1 CT1 18XCR BRD 8 (SUTURE) ×1 IMPLANT
SUT VIC AB 1 CT1 8-18 (SUTURE) ×3
SUT VIC AB 2-0 CT2 18 VCP726D (SUTURE) ×6 IMPLANT
SYR BULB IRRIG 60ML STRL (SYRINGE) ×3 IMPLANT
SYR CONTROL 10ML LL (SYRINGE) ×3 IMPLANT
TAPE CLOTH 4X10 WHT NS (GAUZE/BANDAGES/DRESSINGS) ×3 IMPLANT
TOWEL GREEN STERILE (TOWEL DISPOSABLE) ×3 IMPLANT
TOWEL GREEN STERILE FF (TOWEL DISPOSABLE) ×6 IMPLANT
TRAY FOLEY MTR SLVR 16FR STAT (SET/KITS/TRAYS/PACK) ×3 IMPLANT
WATER STERILE IRR 1000ML POUR (IV SOLUTION) ×3 IMPLANT
YANKAUER SUCT BULB TIP NO VENT (SUCTIONS) ×3 IMPLANT

## 2020-02-25 NOTE — Progress Notes (Signed)
Pt is s/p lateral interbody fusion. She doesn't have any drain in place and only need on dose of vanc post op.   Vanc 1g IV x1 12 hr post pre-op dose.   Onnie Boer, PharmD, BCIDP, AAHIVP, CPP Infectious Disease Pharmacist 02/25/2020 2:27 PM

## 2020-02-25 NOTE — Anesthesia Postprocedure Evaluation (Signed)
Anesthesia Post Note  Patient: Cheryl Finley  Procedure(s) Performed: LEFT-LATERAL INTERBODY FUSION LUMBAR 2 - LUMBAR 3 WITH INSTRUMENTATION AND ALLOGRAFT (Left )     Patient location during evaluation: PACU Anesthesia Type: General Level of consciousness: awake and alert Pain management: pain level controlled Vital Signs Assessment: post-procedure vital signs reviewed and stable Respiratory status: spontaneous breathing, nonlabored ventilation and respiratory function stable Cardiovascular status: blood pressure returned to baseline and stable Postop Assessment: no apparent nausea or vomiting Anesthetic complications: no    Last Vitals:  Vitals:   02/25/20 1130 02/25/20 1200  BP: 139/64 136/62  Pulse: 71 69  Resp: 17 16  Temp:    SpO2: 100% 100%    Last Pain:  Vitals:   02/25/20 1130  TempSrc:   PainSc: 10-Worst pain ever                 Shaquasha Gerstel,W. EDMOND

## 2020-02-25 NOTE — Addendum Note (Signed)
Addendum  created 02/25/20 1329 by Barrington Ellison, CRNA   Flowsheet accepted, Intraprocedure Flowsheets edited

## 2020-02-25 NOTE — H&P (Signed)
PREOPERATIVE H&P  Chief Complaint: Leg pain  HPI: Cheryl Finley is a 74 y.o. female who presents with ongoing pain in the bilateral legs  MRI reveals adjacent segment degeneration at the level above her previous fusion, at L2/3  Patient has failed multiple forms of conservative care and continues to have pain (see office notes for additional details regarding the patient's full course of treatment)  Past Medical History:  Diagnosis Date  . Anemia    using iron daily  . Arthritis    OA- knees, back, spine, hands   . Asthma   . Cancer [facility] 2017   breast - pre-cancer  . Complication of anesthesia    hard to wake   . Diabetes mellitus without complication [facility]) [id number]   Dr. Nicholes Stairs- endocrinologist- with [facility]  . Family history of adverse reaction to anesthesia    Mother of pt. hard to wake up   . Heart murmur   . History of hiatal hernia   . Hyperlipemia   . Hypertension   . Hypothyroidism   . Peripheral vascular disease ([place]) 1981   DVT- bilateral LE- postop- hysterectomy  . Pneumonia    as a baby - double pneumonia - still has scarring in her lungs   . Sleep apnea    CPAP- anytime she rests   Past Surgical History:  Procedure Laterality Date  . ABDOMINAL HYSTERECTOMY    . BREAST SURGERY Right 2017   pathology- precancer  . CERVICAL SPINE SURGERY    . CHOLECYSTECTOMY    . DILATION AND CURETTAGE OF UTERUS    . EYE SURGERY Bilateral    cataracts removed.   . hystersalpingogram    . JOINT REPLACEMENT    . KNEE CARTILAGE SURGERY Bilateral    2015, 2020  . PARTIAL HYSTERECTOMY    . SHOULDER ARTHROSCOPY WITH OPEN ROTATOR CUFF REPAIR Left   . THYROIDECTOMY  unsure  . TONSILLECTOMY    . TRANSFORAMINAL LUMBAR INTERBODY FUSION (TLIF) WITH PEDICLE SCREW FIXATION 1 LEVEL Left 08/20/2019   Procedure: LEFT-SIDED LUMBAR 3-4 TRANSFORAMINAL LUMBAR INTERBODY FUSION WITH INSTRUMENTATION AND ALLOGRAFT;  Surgeon: Phylliss Bob, MD;  Location: [place];  Service: Orthopedics;   Laterality: Left;   Social History   Socioeconomic History  . Marital status: Married    Spouse name: Not on file  . Number of children: Not on file  . Years of education: Not on file  . Highest education level: Not on file  Occupational History  . Not on file  Tobacco Use  . Smoking status: Never Smoker  . Smokeless tobacco: Never Used  Substance and Sexual Activity  . Alcohol use: No  . Drug use: No  . Sexual activity: Not on file  Other Topics Concern  . Not on file  Social History Narrative  . Not on file   Social Determinants of Health   Financial Resource Strain:   . Difficulty of Paying Living Expenses:   Food Insecurity:   . Worried About Charity fundraiser in the Last Year:   . Arboriculturist in the Last Year:   Transportation Needs:   . Film/video editor (Medical):   Marland Kitchen Lack of Transportation (Non-Medical):   Physical Activity:   . Days of Exercise per Week:   . Minutes of Exercise per Session:   Stress:   . Feeling of Stress :   Social Connections:   . Frequency of Communication with Friends and Family:   .  Frequency of Social Gatherings with Friends and Family:   . Attends Religious Services:   . Active Member of Clubs or Organizations:   . Attends Archivist Meetings:   Marland Kitchen Marital Status:    Family History  Problem Relation Age of Onset  . Diabetes Mother   . Cancer Mother   . Cancer Father   . Cancer Sister   . Diabetes Brother   . Cancer Brother    Allergies  Allergen Reactions  . Milk-Related Compounds     Causes a lot of mucus and induces bronchitis  . Penicillins     Unknown, infant allergy  . Percodan [Oxycodone-Aspirin]     Causes severe headaches   . Sulfa Antibiotics     Unknown, infant allergy  . Latex Rash   Prior to Admission medications   Medication Sig Start Date End Date Taking? Authorizing Provider  amLODipine (NORVASC) 5 MG tablet Take 5 mg by mouth daily.   Yes [provider]  Ascorbic Acid  (VITAMIN C) 1000 MG tablet Take 1,000 mg by mouth 2 (two) times daily.    Yes [provider]  aspirin 81 MG tablet Take 81 mg by mouth at bedtime.    Yes [provider]  atorvastatin (LIPITOR) 40 MG tablet Take 40 mg by mouth at bedtime.   Yes [provider]  Biotin w/ Vitamins C & E (HAIR/SKIN/NAILS PO) Take 1 tablet by mouth in the morning and at bedtime.    Yes [provider]  budesonide-formoterol (SYMBICORT) 160-4.5 MCG/ACT inhaler Inhale 2 puffs into the lungs 2 (two) times daily.    Yes [provider]  Calcium Carbonate-Vitamin D (CALCIUM 600+D PO) Take 1 tablet by mouth 2 (two) times daily.   Yes [provider]  diphenhydrAMINE (BENADRYL) 25 MG tablet Take 25 mg by mouth in the morning and at bedtime.   Yes [provider]  fenofibrate (TRICOR) 145 MG tablet Take 145 mg by mouth at bedtime.    Yes [provider]  ferrous sulfate 324 MG TBEC Take 324 mg by mouth daily after lunch.   Yes [provider]  Flaxseed, Linseed, (FLAX SEED OIL) 1300 MG CAPS Take 1,300 mg by mouth daily.    Yes [provider]  fluticasone (FLONASE) 50 MCG/ACT nasal spray Place 1 spray into both nostrils daily.    Yes [provider]  folic acid (FOLVITE) A999333 MCG tablet Take 400 mcg by mouth daily.   Yes [provider]  furosemide (LASIX) 20 MG tablet Take 20 mg by mouth daily as needed for fluid.   Yes [provider]  glimepiride (AMARYL) 4 MG tablet Take 4 mg by mouth 2 (two) times daily.   Yes [provider]  insulin glargine (LANTUS) 100 UNIT/ML injection Inject 30 Units into the skin at bedtime.   Yes [provider]  insulin lispro (HUMALOG) 100 UNIT/ML injection Inject 1-15 Units into the skin daily as needed for high blood sugar (above 150). This order is for the a.m. but if she has a  Fasting  BS of > 150 in the a.m. & uses the humalog, then she must continue on that  day to check BS before each meal & continue to use the Humalog per the sliding scale .   Yes [provider]  levothyroxine (SYNTHROID) 125 MCG tablet Take 125 mcg by mouth daily before breakfast.    Yes [provider]  Melatonin 5 MG TABS Take  10 mg by mouth at bedtime.   Yes [provider]  metFORMIN (GLUCOPHAGE) 500 MG tablet Take 1,000 mg by mouth 2 (two) times daily with a meal.    Yes [provider]  montelukast (SINGULAIR) 10 MG tablet Take 10 mg by mouth at bedtime.   Yes [provider]  Multiple Vitamins-Minerals (CENTRUM SILVER PO) Take 1 tablet by mouth at bedtime.    Yes [provider]  Semaglutide, 1 MG/DOSE, (OZEMPIC, 1 MG/DOSE,) 2 MG/1.5ML SOPN Inject 1 mg into the skin every Thursday.   Yes [provider]  spironolactone (ALDACTONE) 50 MG tablet Take 50 mg by mouth daily.   Yes [provider]  telmisartan (MICARDIS) 40 MG tablet Take 40 mg by mouth at bedtime.   Yes [provider]  azithromycin (ZITHROMAX) 250 MG tablet Take 2 tabs PO x 1 dose, then 1 tab PO QD x 4 days Patient not taking: Reported on 08/08/2019 09/18/13   Deneise Lever, MD  benzonatate (TESSALON) 200 MG capsule Take 1 capsule (200 mg total) by mouth at bedtime. Take as needed for cough Patient not taking: Reported on 08/08/2019 09/21/13   Kandra Nicolas, MD  diazepam (VALIUM) 5 MG tablet Take 1 tablet (5 mg total) by mouth every 6 (six) hours as needed for muscle spasms. Patient not taking: Reported on 02/12/2020 08/20/19   Phylliss Bob, MD  doxycycline (VIBRAMYCIN) 100 MG capsule Take 1 capsule (100 mg total) by mouth 2 (two) times daily. Patient not taking: Reported on 08/08/2019 09/21/13   Kandra Nicolas, MD  oxyCODONE-acetaminophen (PERCOCET/ROXICET) 5-325 MG tablet Take 1-2 tablets by mouth every 4 (four) hours as needed for moderate pain or severe pain. Patient not taking: Reported on 02/12/2020 08/20/19   Phylliss Bob, MD  predniSONE (DELTASONE) 20 MG tablet Take 1 tablet (20 mg total) by mouth 2 (two) times daily. Patient not taking: Reported on 08/08/2019 09/21/13   Kandra Nicolas, MD     All other systems have been reviewed and were otherwise negative with the exception of those mentioned in the HPI and as above.  Physical Exam: Vitals:   02/25/20 0619  BP: (!) 152/68  Pulse: 77  Resp: 18  Temp: 97.6 F (36.4 C)  SpO2: 98%    Body mass index is 32.93 kg/m.  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

## 2020-02-25 NOTE — Transfer of Care (Signed)
Immediate Anesthesia Transfer of Care Note  Patient: Cheryl Finley  Procedure(s) Performed: LEFT-LATERAL INTERBODY FUSION LUMBAR 2 - LUMBAR 3 WITH INSTRUMENTATION AND ALLOGRAFT (Left )  Patient Location: PACU  Anesthesia Type:General  Level of Consciousness: drowsy and patient cooperative  Airway & Oxygen Therapy: Patient Spontanous Breathing and Patient connected to face mask oxygen  Post-op Assessment: Report given to RN  Post vital signs: Reviewed and stable  Last Vitals:  Vitals Value Taken Time  BP 136/96 02/25/20 1055  Temp    Pulse 73 02/25/20 1057  Resp 16 02/25/20 1057  SpO2 100 % 02/25/20 1057  Vitals shown include unvalidated device data.  Last Pain:  Vitals:   02/25/20 0619  TempSrc: Oral  PainSc:       Patients Stated Pain Goal: 4 ([id number])  Complications: No apparent anesthesia complications

## 2020-02-25 NOTE — Anesthesia Procedure Notes (Signed)
Procedure Name: Intubation Date/Time: 02/25/2020 7:45 AM Performed by: Barrington Ellison, CRNA Pre-anesthesia Checklist: Patient identified, Emergency Drugs available, Suction available and Patient being monitored Patient Re-evaluated:Patient Re-evaluated prior to induction Oxygen Delivery Method: Circle System Utilized Preoxygenation: Pre-oxygenation with 100% oxygen Induction Type: IV induction Ventilation: Mask ventilation without difficulty Laryngoscope Size: Glidescope and 4 Grade View: Grade I Tube type: Oral Tube size: 7.0 mm Number of attempts: 1 Airway Equipment and Method: Stylet and Oral airway Placement Confirmation: ETT inserted through vocal cords under direct vision,  positive ETCO2 and breath sounds checked- equal and bilateral Secured at: 21 cm Tube secured with: Tape Dental Injury: Teeth and Oropharynx as per pre-operative assessment  Difficulty Due To: Difficulty was anticipated and Difficult Airway- due to limited oral opening Comments: Elective Glidescope used d/t previous notes of difficult intubation

## 2020-02-25 NOTE — OR Nursing (Signed)
1043 New protocol: Local Abd Xray negative for surgical items per Radiologist, Dr. Tery Sanfilippo.

## 2020-02-25 NOTE — Progress Notes (Signed)
Patient assisted with set up for her home CPAP machine.  Nasal pillows and home settings used.  Patient is familiar with equipment and procedure.  Patient is able to self-administer CPAP therapy.

## 2020-02-25 NOTE — Op Note (Signed)
PATIENT NAME: Cheryl Finley   MEDICAL RECORD NO.:   [id number]      DATE OF BIRTH: 03/18/1946   DATE OF PROCEDURE: 02/25/2020                                                                           OPERATIVE REPORT   PREOPERATIVE DIAGNOSES: 1.  Adjacent segment degeneration, L2-3 2.  S/p previous L3/4 fusion 3.  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  Adjacent segment degeneration, L2-3 2.  S/p previous L3/4 fusion 3.  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  Left-sided lateral interbody fusion, L2/3  via direct lateral retroperitoneal approach. 2.  Insertion of interbody device x1 (12 x 22 mm x 50 mm Alphatec intervertebral spacer). 3.  Placement of anterior instrumentation, L2/3 (20mm Alphatec plate and screws) 4.  Use of morselized allograft -- Vivaien.   5.  Intraoperative use of fluoroscopy.   SURGEON:  Phylliss Bob, MD   ASSISTANT:  Pricilla Holm PA-C.   ANESTHESIA:  General endotracheal anesthesia.   COMPLICATIONS:  None.   DISPOSITION:  Stable.   ESTIMATED BLOOD LOSS:  Minimal.   INDICATIONS:  Briefly, Cheryl Finley is a very pleasant 74 year old female who did present to me with symptoms consistent with progressive neurogenic claudication.  She is noted to be status post an L3-4 fusion.  The MRI did reveal adjacent segment degeneration and stenosis at L2-3.  She did fail conservative treatment measures, and did wish to proceed with surgery.  We did discuss proceeding with a staged procedure.  Specifically, a lateral fusion with instrumentation at L2-3, to be followed by a posterior fusion with instrumentation on the following day. The patient did wish to proceed, after a full understanding of the risks and benefits of surgery.   DESCRIPTION OF PROCEDURE:  On 02/25/2020 the patient was brought to surgery and general endotracheal anesthesia was administered.  The patient was placed in  the lateral decubitus position, with the left side up.  Neurologic monitoring leads were placed by the monitoring technician.  The patient's torso and lower extremities were secured to the bed.  The patient's hips and knees were flexed in order to lessen the tension on the psoas musculature.  The left flank was then prepped and draped in the usual sterile fashion.  The bed was flexed, in order to optimize exposure to the L2/3 intervertebral space.  After a timeout procedure was performed, a left-sided transverse incision was made over the left flank overlying the L2/3 intervertebral space.  The retroperitoneal space was encountered, after dissection through the oblique musculature.  The peritoneum was bluntly swept anteriorly, and the psoas was readily identified.  I did use a series of dilators to dock over the L2/3 intervertebral space.  I did use neurologic monitoring while placing the dilators, in order to ensure that there were no neurologic structures in the immediate vicinity of the dilators.  The lumbar plexus was noted to be posterior.  A self-retaining retractor was placed, and was attached to a rigid arm.  The retractor was very gently dilated and a shim was placed into the L2/3 intervertebral space.  I then  used a knife to perform an annulotomy at the lateral aspect of the L2/3 intervertebral space.  I then used a series of curettes and pituitary rongeurs in order to perform a thorough and complete L2/3 intervertebral diskectomy.  The contralateral annulus was released.  I then placed a series of intervertebral spacer trials, and I did feel that a 24mm x 22 mm x 50 mm spacer would be the most appropriate fit.  The appropriate spacer was then packed with ViviGen and tamped into position.  I was very pleased with the final resting position of the intervertebral spacer.  Excellent height restoration was noted. At this point, a 10 mm plate was placed over the lateral aspect of the L2 and L3 vertebral bodies.   I then used an awl to prepare the trajectory of the L2 and L3 vertebral body screws.  A 45 mm screw was placed into the L2 vertebral body, and a 45 mm screw was placed into the L3 vertebral body. The screws were then locked into the plate.  The break in the bed was removed and the bed was flattened and the plate was then locked.  I was very pleased with the final AP and lateral fluoroscopic images and the excellent restoration of disk height identified on both AP and lateral images.  At this point, the wound was copiously irrigated.  The fascia, internal, and external oblique musculature was closed using #1 Vicryl.  The subcutaneous layer was  closed using 2-0 Vicryl and the skin was closed using 4-0 Monocryl.    Of note, I did use neurologic monitoring throughout the entire surgery, and there was no sustained EMG activity noted throughout the entire surgery. All instrument counts were correct at the termination of the procedure.   Of note, Pricilla Holm was my assistant throughout surgery, and did aid in retraction, placement of the hardware, suctioning, and closure.  Phylliss Bob, MD

## 2020-02-25 NOTE — Anesthesia Preprocedure Evaluation (Addendum)
Anesthesia Evaluation  Patient identified by MRN, date of birth, ID band Patient awake    Reviewed: Allergy & Precautions, NPO status , Patient's Chart, lab work & pertinent test results  History of Anesthesia Complications Negative for: history of anesthetic complications  Airway Mallampati: II  TM Distance: >3 FB Neck ROM: Full  Mouth opening: Limited Mouth Opening  Dental  (+) Dental Advisory Given, Partial Upper, Partial Lower   Pulmonary asthma , sleep apnea and Continuous Positive Airway Pressure Ventilation ,    Pulmonary exam normal        Cardiovascular hypertension, Pt. on medications + Peripheral Vascular Disease and + DVT  Normal cardiovascular exam     Neuro/Psych negative neurological ROS  negative psych ROS   GI/Hepatic Neg liver ROS, hiatal hernia,   Endo/Other  diabetes, Type 2, Oral Hypoglycemic Agents, Insulin DependentHypothyroidism  Obesity   Renal/GU negative Renal ROS     Musculoskeletal  (+) Arthritis ,   Abdominal   Peds  Hematology negative hematology ROS (+)   Anesthesia Other Findings   Reproductive/Obstetrics                            Anesthesia Physical Anesthesia Plan  ASA: III  Anesthesia Plan: General   Post-op Pain Management:    Induction: Intravenous  PONV Risk Score and Plan: 3 and Treatment may vary due to age or medical condition, Ondansetron, Propofol infusion and Dexamethasone  Airway Management Planned: Video Laryngoscope Planned and Oral ETT  Additional Equipment: None  Intra-op Plan:   Post-operative Plan: Extubation in OR  Informed Consent: I have reviewed the patients History and Physical, chart, labs and discussed the procedure including the risks, benefits and alternatives for the proposed anesthesia with the patient or authorized representative who has indicated his/her understanding and acceptance.     Dental advisory  given  Plan Discussed with: CRNA and Anesthesiologist  Anesthesia Plan Comments:        Anesthesia Quick Evaluation

## 2020-02-26 ENCOUNTER — Inpatient Hospital Stay (HOSPITAL_COMMUNITY): Admission: RE | Admit: 2020-02-26 | Payer: Medicare Other | Source: Home / Self Care | Admitting: Orthopedic Surgery

## 2020-02-26 ENCOUNTER — Encounter (HOSPITAL_COMMUNITY): Payer: Self-pay | Admitting: Orthopedic Surgery

## 2020-02-26 ENCOUNTER — Inpatient Hospital Stay (HOSPITAL_COMMUNITY): Payer: Medicare Other

## 2020-02-26 ENCOUNTER — Inpatient Hospital Stay (HOSPITAL_COMMUNITY)
Admission: RE | Disposition: A | Discharge status: Home / Self Care | Payer: Self-pay | Source: Home / Self Care | Attending: Orthopedic Surgery

## 2020-02-26 ENCOUNTER — Inpatient Hospital Stay (HOSPITAL_COMMUNITY): Payer: Medicare Other | Admitting: Certified Registered"

## 2020-02-26 LAB — GLUCOSE, CAPILLARY
Glucose-Capillary: 82 mg/dL (ref 70–99)
Glucose-Capillary: 172 mg/dL — ABNORMAL HIGH (ref 70–99)
Glucose-Capillary: 246 mg/dL — ABNORMAL HIGH (ref 70–99)
Glucose-Capillary: 104 mg/dL — ABNORMAL HIGH (ref 70–99)

## 2020-02-26 LAB — POCT I-STAT, CHEM 8
BUN: 12 mg/dL (ref 8–23)
Calcium, Ion: 1.24 mmol/L (ref 1.15–1.40)
Chloride: 102 mmol/L (ref 98–111)
Creatinine, Ser: 0.7 mg/dL (ref 0.44–1.00)
Glucose, Bld: 104 mg/dL — ABNORMAL HIGH (ref 70–99)
HCT: 34 % — ABNORMAL LOW (ref 36.0–46.0)
Hemoglobin: 11.6 g/dL — ABNORMAL LOW (ref 12.0–15.0)
Potassium: 3.9 mmol/L (ref 3.5–5.1)
Sodium: 141 mmol/L (ref 135–145)
TCO2: 28 mmol/L (ref 22–32)

## 2020-02-26 IMAGING — RF DG C-ARM 1-60 MIN
1 series · 2 of 2 positions shown · IV contrast (agent unspecified)
Comparison: [DATE] as well as MRI [DATE]

CLINICAL DATA: L2-3 posterior fusion.

EXAM:
DG C-ARM 1-60 MIN; LUMBAR SPINE - 2-3 VIEW
CONTRAST:  None.
FLUOROSCOPY TIME:  Fluoroscopy Time:  0 minutes 40 seconds
Radiation Exposure Index (if provided by the fluoroscopic device):
36.5 mGy
Number of Acquired Spot Images: 2

[Series 1: run · 2 of 2 slices shown]
[im 1/2]
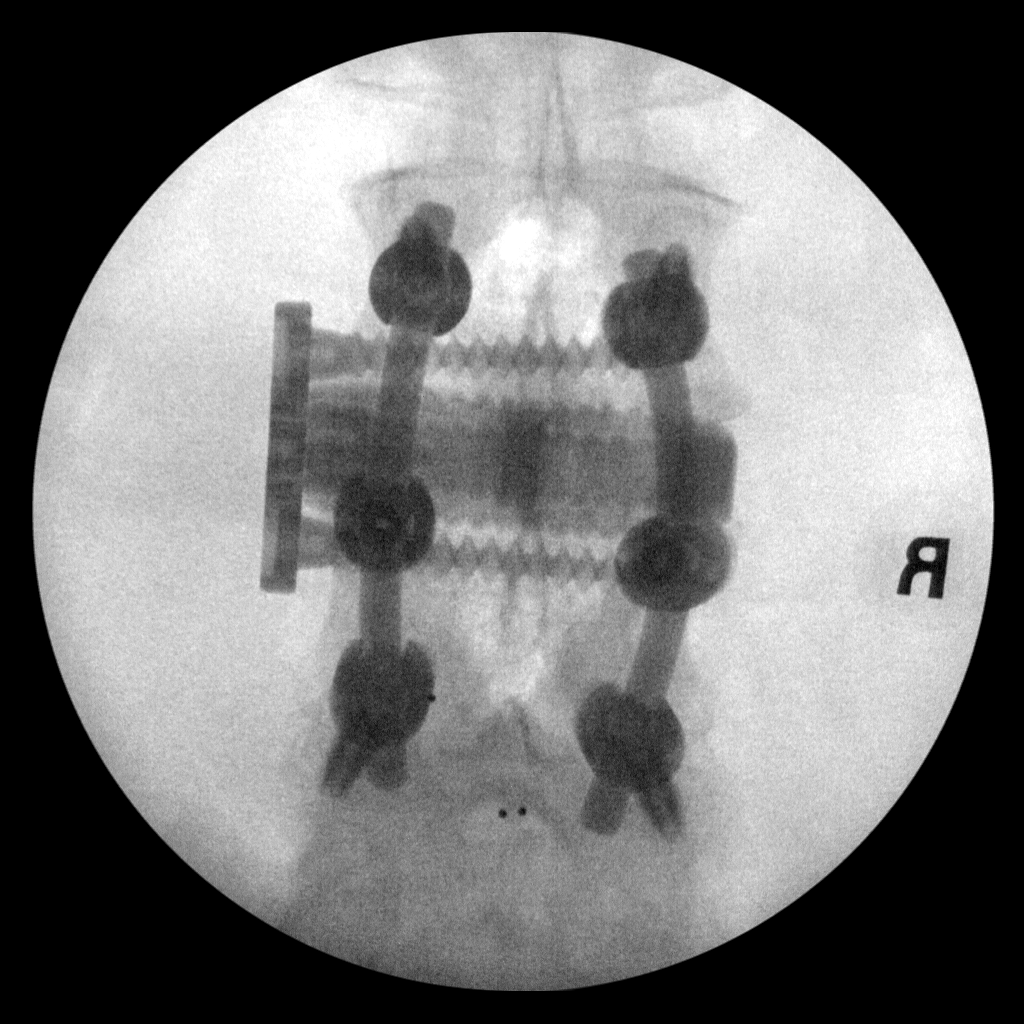
[im 2/2]
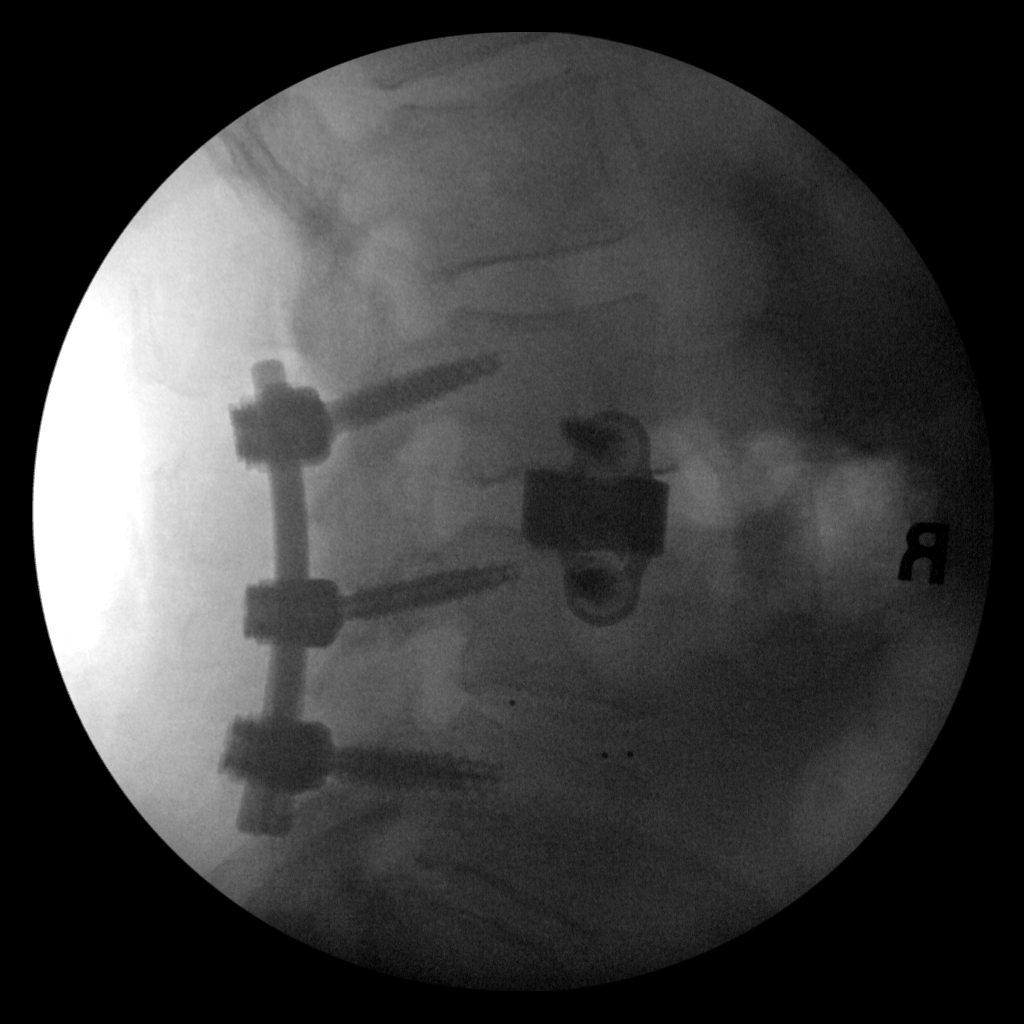

[2 of 2 positions shown; findings below may reference images not displayed]

FINDINGS: Examination demonstrates evidence of patient's evidence of patient's
left lateral fusion hardware with interbody fusion at the L2-3 level
unchanged. There is been revision of patient's posterior fusion
hardware at the L3-4 level as this posterior fusion hardware now
extends from L2-L4 with bilateral pedicle screws. Hardware is intact
and normally located. Remainder the exam is unchanged.
IMPRESSION: Interval revision of posterior fusion hardware which now extends
from L2-L4 with bilateral pedicle screws intact. Stable existing
left lateral fusion hardware/interbody fusion at the L2-3 level.

## 2020-02-26 IMAGING — RF DG LUMBAR SPINE 2-3V
1 series · 2 of 2 positions shown · IV contrast (agent unspecified)
Comparison: [DATE] as well as MRI [DATE]

CLINICAL DATA: L2-3 posterior fusion.

EXAM:
DG C-ARM 1-60 MIN; LUMBAR SPINE - 2-3 VIEW
CONTRAST:  None.
FLUOROSCOPY TIME:  Fluoroscopy Time:  0 minutes 40 seconds
Radiation Exposure Index (if provided by the fluoroscopic device):
36.5 mGy
Number of Acquired Spot Images: 2

[Series 1: run · 2 of 2 slices shown]
[im 1/2]
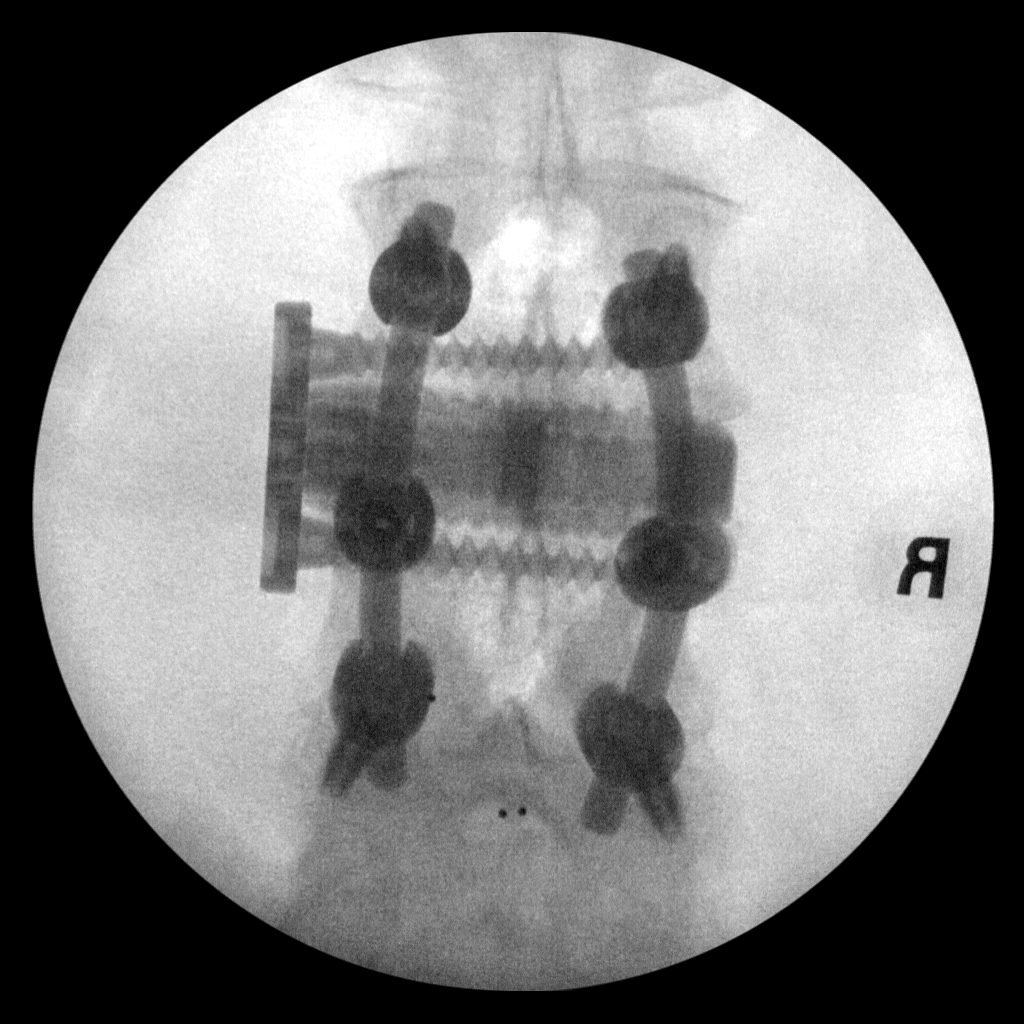
[im 2/2]
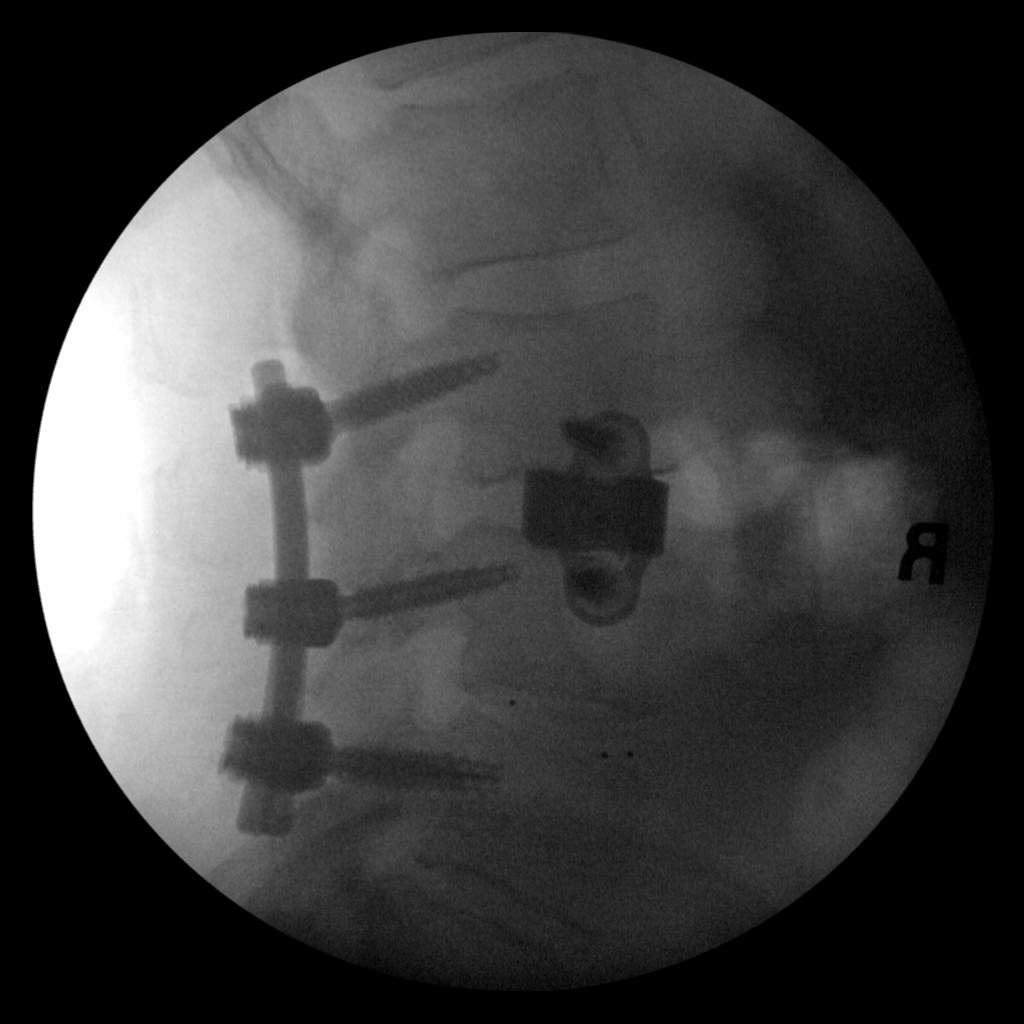

[2 of 2 positions shown; findings below may reference images not displayed]

FINDINGS: Examination demonstrates evidence of patient's evidence of patient's
left lateral fusion hardware with interbody fusion at the L2-3 level
unchanged. There is been revision of patient's posterior fusion
hardware at the L3-4 level as this posterior fusion hardware now
extends from L2-L4 with bilateral pedicle screws. Hardware is intact
and normally located. Remainder the exam is unchanged.
IMPRESSION: Interval revision of posterior fusion hardware which now extends
from L2-L4 with bilateral pedicle screws intact. Stable existing
left lateral fusion hardware/interbody fusion at the L2-3 level.

## 2020-02-26 SURGERY — POSTERIOR LUMBAR FUSION 1 LEVEL
Anesthesia: General

## 2020-02-26 MED ORDER — LACTATED RINGERS IV SOLN: INTRAVENOUS | Status: DC | PRN

## 2020-02-26 MED ORDER — PHENYLEPHRINE HCL-NACL 10-0.9 MG/250ML-% IV SOLN
INTRAVENOUS | Status: DC | PRN
  Administered 2020-02-26: 40 ug/min via INTRAVENOUS

## 2020-02-26 MED ORDER — MIDAZOLAM HCL 2 MG/2ML IJ SOLN
INTRAMUSCULAR | Status: AC
  Filled 2020-02-26: qty 2

## 2020-02-26 MED ORDER — CEFAZOLIN SODIUM 1 G IJ SOLR
INTRAMUSCULAR | Status: AC
  Filled 2020-02-26: qty 20

## 2020-02-26 MED ORDER — FENTANYL CITRATE (PF) 250 MCG/5ML IJ SOLN
INTRAMUSCULAR | Status: AC
  Filled 2020-02-26: qty 5

## 2020-02-26 MED ORDER — PROPOFOL 1000 MG/100ML IV EMUL
INTRAVENOUS | Status: AC
  Filled 2020-02-26: qty 100

## 2020-02-26 MED ORDER — PROPOFOL 10 MG/ML IV BOLUS
INTRAVENOUS | Status: DC | PRN
  Administered 2020-02-26: 150 mg via INTRAVENOUS

## 2020-02-26 MED ORDER — ROCURONIUM BROMIDE 100 MG/10ML IV SOLN
INTRAVENOUS | Status: DC | PRN
  Administered 2020-02-26: 10 mg via INTRAVENOUS
  Administered 2020-02-26: 50 mg via INTRAVENOUS

## 2020-02-26 MED ORDER — DEXAMETHASONE SODIUM PHOSPHATE 10 MG/ML IJ SOLN
INTRAMUSCULAR | Status: DC | PRN
  Administered 2020-02-26: 5 mg via INTRAVENOUS

## 2020-02-26 MED ORDER — BUPIVACAINE LIPOSOME 1.3 % IJ SUSP
20.0000 mL | Freq: Once | INTRAMUSCULAR | Status: DC
  Filled 2020-02-26: qty 20

## 2020-02-26 MED ORDER — LIDOCAINE 2% (20 MG/ML) 5 ML SYRINGE
INTRAMUSCULAR | Status: DC | PRN
  Administered 2020-02-26: 100 mg via INTRAVENOUS

## 2020-02-26 MED ORDER — ONDANSETRON HCL 4 MG/2ML IJ SOLN
INTRAMUSCULAR | Status: AC
  Filled 2020-02-26: qty 2

## 2020-02-26 MED ORDER — SUCCINYLCHOLINE CHLORIDE 200 MG/10ML IV SOSY
PREFILLED_SYRINGE | INTRAVENOUS | Status: DC | PRN
  Administered 2020-02-26: 120 mg via INTRAVENOUS

## 2020-02-26 MED ORDER — EPHEDRINE SULFATE-NACL 50-0.9 MG/10ML-% IV SOSY
PREFILLED_SYRINGE | INTRAVENOUS | Status: DC | PRN
  Administered 2020-02-26: 5 mg via INTRAVENOUS

## 2020-02-26 MED ORDER — BUPIVACAINE LIPOSOME 1.3 % IJ SUSP
INTRAMUSCULAR | Status: DC | PRN
  Administered 2020-02-26: 20 mL

## 2020-02-26 MED ORDER — EPINEPHRINE 1 MG/10ML IJ SOSY
PREFILLED_SYRINGE | INTRAMUSCULAR | Status: DC | PRN
  Administered 2020-02-26: 0.1 mg

## 2020-02-26 MED ORDER — SUGAMMADEX SODIUM 200 MG/2ML IV SOLN
INTRAVENOUS | Status: DC | PRN
  Administered 2020-02-26: 200 mg via INTRAVENOUS

## 2020-02-26 MED ORDER — PHENYLEPHRINE 40 MCG/ML (10ML) SYRINGE FOR IV PUSH (FOR BLOOD PRESSURE SUPPORT)
PREFILLED_SYRINGE | INTRAVENOUS | Status: AC
  Filled 2020-02-26: qty 10

## 2020-02-26 MED ORDER — FENTANYL CITRATE (PF) 100 MCG/2ML IJ SOLN
INTRAMUSCULAR | Status: DC | PRN
  Administered 2020-02-26 (×5): 50 ug via INTRAVENOUS

## 2020-02-26 MED ORDER — MIDAZOLAM HCL 5 MG/5ML IJ SOLN
INTRAMUSCULAR | Status: DC | PRN
  Administered 2020-02-26: 2 mg via INTRAVENOUS

## 2020-02-26 MED ORDER — PROPOFOL 10 MG/ML IV BOLUS
INTRAVENOUS | Status: AC
  Filled 2020-02-26: qty 20

## 2020-02-26 MED ORDER — DIPHENHYDRAMINE HCL 50 MG/ML IJ SOLN
INTRAMUSCULAR | Status: DC | PRN
  Administered 2020-02-26: 25 mg via INTRAVENOUS

## 2020-02-26 MED ORDER — CEFAZOLIN SODIUM-DEXTROSE 2-3 GM-%(50ML) IV SOLR
INTRAVENOUS | Status: DC | PRN
  Administered 2020-02-26: 2 g via INTRAVENOUS

## 2020-02-26 MED ORDER — ROCURONIUM BROMIDE 10 MG/ML (PF) SYRINGE
PREFILLED_SYRINGE | INTRAVENOUS | Status: AC
  Filled 2020-02-26: qty 10

## 2020-02-26 MED ORDER — THROMBIN 20000 UNITS EX SOLR
CUTANEOUS | Status: DC | PRN
  Administered 2020-02-26: 20000 [IU] via TOPICAL

## 2020-02-26 MED ORDER — PHENYLEPHRINE 40 MCG/ML (10ML) SYRINGE FOR IV PUSH (FOR BLOOD PRESSURE SUPPORT)
PREFILLED_SYRINGE | INTRAVENOUS | Status: DC | PRN
  Administered 2020-02-26: 80 ug via INTRAVENOUS

## 2020-02-26 MED ORDER — DEXAMETHASONE SODIUM PHOSPHATE 10 MG/ML IJ SOLN
INTRAMUSCULAR | Status: AC
  Filled 2020-02-26: qty 1

## 2020-02-26 MED ORDER — BUPIVACAINE-EPINEPHRINE 0.25% -1:200000 IJ SOLN
INTRAMUSCULAR | Status: DC | PRN
  Administered 2020-02-26: 9 mL
  Administered 2020-02-26: 20 mL

## 2020-02-26 MED ORDER — FENTANYL CITRATE (PF) 100 MCG/2ML IJ SOLN
INTRAMUSCULAR | Status: AC
  Filled 2020-02-26: qty 2

## 2020-02-26 MED ORDER — ONDANSETRON HCL 4 MG/2ML IJ SOLN
INTRAMUSCULAR | Status: DC | PRN
  Administered 2020-02-26 (×2): 4 mg via INTRAVENOUS

## 2020-02-26 MED ORDER — METHYLENE BLUE 0.5 % INJ SOLN
INTRAVENOUS | Status: DC | PRN
  Administered 2020-02-26: .3 mL via SUBMUCOSAL

## 2020-02-26 MED ORDER — PROPOFOL 500 MG/50ML IV EMUL
INTRAVENOUS | Status: DC | PRN
  Administered 2020-02-26: 75 ug/kg/min via INTRAVENOUS

## 2020-02-26 MED ORDER — DIPHENHYDRAMINE HCL 50 MG/ML IJ SOLN
INTRAMUSCULAR | Status: AC
  Filled 2020-02-26: qty 1

## 2020-02-26 MED ORDER — ONDANSETRON HCL 4 MG/2ML IJ SOLN: 4.0000 mg | Freq: Once | INTRAMUSCULAR | Status: DC | PRN

## 2020-02-26 MED ORDER — 0.9 % SODIUM CHLORIDE (POUR BTL) OPTIME
TOPICAL | Status: DC | PRN
  Administered 2020-02-26: 1000 mL

## 2020-02-26 MED ORDER — FENTANYL CITRATE (PF) 100 MCG/2ML IJ SOLN
25.0000 ug | INTRAMUSCULAR | Status: DC | PRN
  Administered 2020-02-26 (×2): 25 ug via INTRAVENOUS

## 2020-02-26 MED FILL — Thrombin (Recombinant) For Soln 20000 Unit: CUTANEOUS | Qty: 1 | Status: AC

## 2020-02-26 SURGICAL SUPPLY — 80 items
BENZOIN TINCTURE PRP APPL 2/3 (GAUZE/BANDAGES/DRESSINGS) ×3 IMPLANT
BLADE CLIPPER SURG (BLADE) ×3 IMPLANT
BUR PRESCISION 1.7 ELITE (BURR) ×3 IMPLANT
BUR ROUND FLUTED 5 RND (BURR) ×2 IMPLANT
BUR ROUND FLUTED 5MM RND (BURR) ×1
BUR ROUND PRECISION 4.0 (BURR) IMPLANT
BUR ROUND PRECISION 4.0MM (BURR)
BUR SABER RD CUTTING 3.0 (BURR) IMPLANT
BUR SABER RD CUTTING 3.0MM (BURR)
CARTRIDGE OIL MAESTRO DRILL (MISCELLANEOUS) ×1 IMPLANT
CLOSURE WOUND 1/2 X4 (GAUZE/BANDAGES/DRESSINGS) ×1
CNTNR URN SCR LID CUP LEK RST (MISCELLANEOUS) ×1 IMPLANT
CONT SPEC 4OZ STRL OR WHT (MISCELLANEOUS) ×3
COVER MAYO STAND STRL (DRAPES) ×6 IMPLANT
COVER SURGICAL LIGHT HANDLE (MISCELLANEOUS) ×3 IMPLANT
DIFFUSER DRILL AIR PNEUMATIC (MISCELLANEOUS) ×3 IMPLANT
DRAPE C-ARM 42X72 X-RAY (DRAPES) ×3 IMPLANT
DRAPE C-ARMOR (DRAPES) ×3 IMPLANT
DRAPE POUCH INSTRU U-SHP 10X18 (DRAPES) ×3 IMPLANT
DRAPE SURG 17X23 STRL (DRAPES) ×12 IMPLANT
DURAPREP 26ML APPLICATOR (WOUND CARE) ×6 IMPLANT
ELECT BLADE 4.0 EZ CLEAN MEGAD (MISCELLANEOUS) ×3
ELECT CAUTERY BLADE 6.4 (BLADE) ×3 IMPLANT
ELECT REM PT RETURN 9FT ADLT (ELECTROSURGICAL) ×3
ELECTRODE BLDE 4.0 EZ CLN MEGD (MISCELLANEOUS) ×1 IMPLANT
ELECTRODE REM PT RTRN 9FT ADLT (ELECTROSURGICAL) ×1 IMPLANT
FILTER STRAW FLUID ASPIR (MISCELLANEOUS) ×3 IMPLANT
GAUZE 4X4 16PLY RFD (DISPOSABLE) ×3 IMPLANT
GAUZE SPONGE 4X4 12PLY STRL (GAUZE/BANDAGES/DRESSINGS) ×3 IMPLANT
GLOVE BIO SURGEON STRL SZ7 (GLOVE) ×3 IMPLANT
GLOVE BIO SURGEON STRL SZ8 (GLOVE) ×3 IMPLANT
GLOVE BIOGEL PI IND STRL 7.0 (GLOVE) ×1 IMPLANT
GLOVE BIOGEL PI IND STRL 8 (GLOVE) ×1 IMPLANT
GLOVE BIOGEL PI INDICATOR 7.0 (GLOVE) ×2
GLOVE BIOGEL PI INDICATOR 8 (GLOVE) ×2
GOWN STRL REUS W/ TWL LRG LVL3 (GOWN DISPOSABLE) ×2 IMPLANT
GOWN STRL REUS W/ TWL XL LVL3 (GOWN DISPOSABLE) ×1 IMPLANT
GOWN STRL REUS W/TWL LRG LVL3 (GOWN DISPOSABLE) ×6
GOWN STRL REUS W/TWL XL LVL3 (GOWN DISPOSABLE) ×3
IV CATH 14GX2 1/4 (CATHETERS) ×3 IMPLANT
KIT BASIN OR (CUSTOM PROCEDURE TRAY) ×3 IMPLANT
KIT POSITION SURG JACKSON T1 (MISCELLANEOUS) ×3 IMPLANT
KIT TURNOVER KIT B (KITS) ×3 IMPLANT
MARKER SKIN DUAL TIP RULER LAB (MISCELLANEOUS) ×6 IMPLANT
NEEDLE 18GX1X1/2 (RX/OR ONLY) (NEEDLE) ×3 IMPLANT
NEEDLE 22X1 1/2 (OR ONLY) (NEEDLE) ×6 IMPLANT
NEEDLE HYPO 25GX1X1/2 BEV (NEEDLE) ×3 IMPLANT
NEEDLE SPNL 18GX3.5 QUINCKE PK (NEEDLE) ×6 IMPLANT
NS IRRIG 1000ML POUR BTL (IV SOLUTION) ×3 IMPLANT
OIL CARTRIDGE MAESTRO DRILL (MISCELLANEOUS) ×3
PACK LAMINECTOMY ORTHO (CUSTOM PROCEDURE TRAY) ×3 IMPLANT
PACK UNIVERSAL I (CUSTOM PROCEDURE TRAY) ×3 IMPLANT
PAD ARMBOARD 7.5X6 YLW CONV (MISCELLANEOUS) ×6 IMPLANT
PATTIES SURGICAL .5 X1 (DISPOSABLE) ×3 IMPLANT
PATTIES SURGICAL .5X1.5 (GAUZE/BANDAGES/DRESSINGS) IMPLANT
PUTTY DBX 1CC (Putty) ×3 IMPLANT
PUTTY DBX 1CC DEPUY (Putty) ×1 IMPLANT
ROD EXPEDIUM PRE BENT 5.5X75 (Rod) ×6 IMPLANT
ROD PRE LORDOSED VIPER 5.5X50 (Rod) ×18 IMPLANT
SCREW VIPER CORT FIX 6.00X30 (Screw) ×6 IMPLANT
SPONGE INTESTINAL PEANUT (DISPOSABLE) ×6 IMPLANT
SPONGE SURGIFOAM ABS GEL 100 (HEMOSTASIS) ×3 IMPLANT
STRIP CLOSURE SKIN 1/2X4 (GAUZE/BANDAGES/DRESSINGS) ×2 IMPLANT
SURGIFLO W/THROMBIN 8M KIT (HEMOSTASIS) IMPLANT
SUT MNCRL AB 4-0 PS2 18 (SUTURE) ×6 IMPLANT
SUT VIC AB 0 CT1 18XCR BRD 8 (SUTURE) ×2 IMPLANT
SUT VIC AB 0 CT1 8-18 (SUTURE) ×6
SUT VIC AB 1 CT1 18XCR BRD 8 (SUTURE) ×2 IMPLANT
SUT VIC AB 1 CT1 8-18 (SUTURE) ×6
SUT VIC AB 2-0 CT2 18 VCP726D (SUTURE) ×6 IMPLANT
SYR 20ML LL LF (SYRINGE) ×6 IMPLANT
SYR BULB IRRIG 60ML STRL (SYRINGE) ×3 IMPLANT
SYR CONTROL 10ML LL (SYRINGE) ×6 IMPLANT
SYR TB 1ML LUER SLIP (SYRINGE) ×6 IMPLANT
TAP CANN VIPER2 DL 5.0 (TAP) ×3 IMPLANT
TAP CANN VIPER2 DL 6.0 (TAP) ×3 IMPLANT
TAP CANN VIPER2 DL 7.0 (TAP) ×3 IMPLANT
TAPE PAPER 3X10 WHT MICROPORE (GAUZE/BANDAGES/DRESSINGS) ×3 IMPLANT
WATER STERILE IRR 1000ML POUR (IV SOLUTION) ×3 IMPLANT
YANKAUER SUCT BULB TIP NO VENT (SUCTIONS) ×3 IMPLANT

## 2020-02-26 NOTE — Anesthesia Procedure Notes (Signed)
Procedure Name: Intubation Date/Time: 02/26/2020 7:39 AM Performed by: Gwyndolyn Saxon, CRNA Pre-anesthesia Checklist: Patient identified, Emergency Drugs available, Suction available and Patient being monitored Patient Re-evaluated:Patient Re-evaluated prior to induction Oxygen Delivery Method: Circle system utilized Preoxygenation: Pre-oxygenation with 100% oxygen Induction Type: IV induction Ventilation: Mask ventilation without difficulty Laryngoscope Size: Glidescope and 3 Grade View: Grade I Tube type: Oral Tube size: 7.0 mm Number of attempts: 1 Airway Equipment and Method: Rigid stylet Placement Confirmation: ETT inserted through vocal cords under direct vision,  positive ETCO2 and breath sounds checked- equal and bilateral Secured at: 21 cm Tube secured with: Tape Dental Injury: Teeth and Oropharynx as per pre-operative assessment

## 2020-02-26 NOTE — Transfer of Care (Signed)
Immediate Anesthesia Transfer of Care Note  Patient: Cheryl Finley  Procedure(s) Performed: LUMBAR 2- LUMBAR 3 POSTERIOR SPINAL FUSION WITH INSTRUMENTATION AND ALLOGRAFT (N/A )  Patient Location: PACU  Anesthesia Type:General  Level of Consciousness: drowsy and patient cooperative  Airway & Oxygen Therapy: Patient Spontanous Breathing and Patient connected to face mask oxygen  Post-op Assessment: Report given to RN and Post -op Vital signs reviewed and stable  Post vital signs: Reviewed and stable  Last Vitals:  Vitals Value Taken Time  BP 141/61 02/26/20 1020  Temp 36.5 C 02/26/20 1020  Pulse 81 02/26/20 1028  Resp 25 02/26/20 1028  SpO2 97 % 02/26/20 1028  Vitals shown include unvalidated device data.  Last Pain:  Vitals:   02/26/20 0457  TempSrc: Oral  PainSc:       Patients Stated Pain Goal: 3 ([id number])  Complications: No apparent anesthesia complications

## 2020-02-26 NOTE — H&P (Signed)
Patient presets this morning for stage 2 of her surgery. Will proceed as planned.

## 2020-02-26 NOTE — Op Note (Addendum)
PATIENT NAME: Cheryl Finley   MEDICAL RECORD NO.:   [id number]    DATE OF BIRTH: 01/30/2020   DATE OF PROCEDURE: 02/26/2020                                                                           OPERATIVE REPORT   PREOPERATIVE DIAGNOSES: 1.  Status post previous L2-3 lateral interbody fusion, requiring posterior fusion with instrumentation 2. 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  Status post previous L2-3 lateral interbody fusion, requiring posterior fusion with instrumentation 2. 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  Posterior spinal fusion, L2/3 2.  Placement of segmental posterior instrumentation, extended up to L2 (connected to previously placed L3-4 posterior instrumentation) 3.  L2-3 decompression 4.  Intraoperative use of fluoroscopy 5.  Use of morselized allograft - DBX putty   SURGEON:  Phylliss Bob, MD   ASSISTANTPricilla Holm PA-C.   ANESTHESIA:  General endotracheal anesthesia.   COMPLICATIONS:  None.   DISPOSITION:  Stable.   ESTIMATED BLOOD LOSS:  Minimal.   INDICATIONS:  Briefly, Ms. Kingsland is 1 day status post a lateral L2-3 interbody fusion procedure.  She did present today for stage 2 of her procedure.  Please refer to my operative report dated 02/25/2020 for a more detailed account of the patient's indications for surgery.   DESCRIPTION OF PROCEDURE:  On 02/26/2020 the patient was brought to surgery and general endotracheal anesthesia was administered.  The patient was placed prone on a well-padded flat Jackson bed with a spinal frame.  Antibiotics were given and a timeout procedure was performed.  A midline incision was made spanning the L2-L4 segments.  At this point, the previously placed hardware was noted overlying the L3-4 intervertebral space.  Dissection was carried up to the L2 level as well.  Using AP and lateral fluoroscopy, I did cannulate the L2 pedicles using a medial to lateral cortical trajectory technique.   I did tap up to a 5.8 mm tap. The bilateral L2-3 facet joints and posterior lateral gutters were subperiosteally exposed, and a high-speed bur was used to decorticate the exposed bone.  I did also use a high-speed bur to remove abundant facet hypertrophy extending into the right and left lateral recesses, thereby decompressing the spinal canal on the right and left sides across the L2-3 segment.  I was very pleased with the amount of bone removal and the decompression noted. At this point, 6 x 30 mm screws were placed into the L2 pedicles bilaterally.  The previously placed rod and caps were removed across the L3-4 segment.  In addition, the bilateral L3 pedicle screws were slightly advanced, given that the facet hypertrophy just above them was removed. New 75 mm rods were placed bilaterally into the tulip heads spanning L2-L4.  Caps were then placed over each pedicle screw bilaterally at L2, L3, and L4.  A final locking procedure was performed.  At this point, allograft in the form of DBX putty was packed into the posterior lateral gutters and facet joints bilaterally, to help aid in the success of the fusion.  I was very pleased with the final AP and lateral fluoroscopic images.  Prior to  placing the allograft, the wound was copiously irrigated with approximately 2 L of normal saline. The wound was then closed using #1 Vicryl followed by 2-0 Vicryl followed by 4-0 Monocryl. Benzoin and Steri-Strips were applied, followed by sterile dressing.     Of note, Pricilla Holm was my assistant throughout surgery, and did aid in retraction, placement of the hardware, suctioning, and closure.   Phylliss Bob, MD

## 2020-02-26 NOTE — Anesthesia Postprocedure Evaluation (Signed)
Anesthesia Post Note  Patient: Cheryl Finley  Procedure(s) Performed: LUMBAR 2- LUMBAR 3 POSTERIOR SPINAL FUSION WITH INSTRUMENTATION AND ALLOGRAFT (N/A )     Patient location during evaluation: PACU Anesthesia Type: General Level of consciousness: awake and alert Pain management: pain level controlled Vital Signs Assessment: post-procedure vital signs reviewed and stable Respiratory status: spontaneous breathing, nonlabored ventilation, respiratory function stable and patient connected to nasal cannula oxygen Cardiovascular status: blood pressure returned to baseline and stable Postop Assessment: no apparent nausea or vomiting Anesthetic complications: no    Last Vitals:  Vitals:   02/26/20 1050 02/26/20 1129  BP: 125/67 (!) 148/46  Pulse: 78 81  Resp: (!) 24 18  Temp: 37 C 37.3 C  SpO2: 97% 98%    Last Pain:  Vitals:   02/26/20 1129  TempSrc: Oral  PainSc:                  Audry Pili

## 2020-02-26 NOTE — Progress Notes (Signed)
Pt was on home CPAP for th night via home nasal pillows.  Patient is tolerating at this time.

## 2020-02-27 LAB — GLUCOSE, CAPILLARY: Glucose-Capillary: 161 mg/dL — ABNORMAL HIGH (ref 70–99)

## 2020-02-27 MED ORDER — DIAZEPAM 5 MG PO TABS: 5.0000 mg | ORAL_TABLET | Freq: Four times a day (QID) | ORAL | 0 refills | Status: DC | PRN

## 2020-02-27 MED FILL — Thrombin For Soln Kit 20000 Unit: CUTANEOUS | Qty: 1 | Status: AC

## 2020-02-27 NOTE — Progress Notes (Signed)
    Patient doing well Has been ambulating   Physical Exam: Vitals:   02/26/20 2323 02/27/20 0357  BP: (!) 105/48 (!) 132/57  Pulse: 73 77  Resp: 18 18  Temp: 99.2 F (37.3 C) 99.8 F (37.7 C)  SpO2: 98% 97%    Dressing in place NVI  POD #1 s/p lateral/posterior fusion, doing well  - up with PT/OT, encourage ambulation - Percocet for pain, Valium for muscle spasms - d/c home today with f/u in 2 weeks

## 2020-02-27 NOTE — Progress Notes (Signed)
Patient is discharged from room 3C06 at this time. Alert and in stable condition. IV site d/c'd and instructions read to patient and spouse with understanding verbalized and all questions answered. Left unit via wheelchair with all belongings at side 

## 2020-02-27 NOTE — Evaluation (Signed)
Occupational Therapy Evaluation Patient Details Name: Cheryl Finley MRN: [id number] DOB: 19-Aug-1946 Today's Date: 02/27/2020    History of Present Illness Pt is a 74 year old woman admitted on 02/25/20 for lateral and next day posterior fusion of L2-3. PMH: previous back sx, asthma, sleep apnea, HTN, DM, PVD, arthritis.   Clinical Impression   All education completed with pt verbalizing understanding of back precautions, activities to avoid an compensatory strategies for ADL. Pt has a supportive husband to assist as she recovers. No further OT needs.    Follow Up Recommendations  No OT follow up    Equipment Recommendations  None recommended by OT    Recommendations for Other Services       Precautions / Restrictions Precautions Precautions: Back Precaution Booklet Issued: No Precaution Comments: pt is familiar with precautions from previous spine sx Required Braces or Orthoses: Spinal Brace Spinal Brace: Thoracolumbosacral orthotic Restrictions Weight Bearing Restrictions: No      Mobility Bed Mobility               General bed mobility comments: verbally reminded pt of log roll technique  Transfers Overall transfer level: Modified independent Equipment used: None                  Balance                                           ADL either performed or assessed with clinical judgement   ADL Overall ADL's : Needs assistance/impaired Eating/Feeding: Independent   Grooming: Modified independent   Upper Body Bathing: Modified independent;With adaptive equipment;Standing   Lower Body Bathing: Modified independent;With adaptive equipment;Sit to/from stand   Upper Body Dressing : Independent   Lower Body Dressing: Minimal assistance;Sit to/from stand   Toilet Transfer: Modified Independent   Toileting- Clothing Manipulation and Hygiene: Modified independent       Functional mobility during ADLs: Modified  independent General ADL Comments: Educated in compensatory strategies for ADL and IADL to avoid.     Vision         Perception     Praxis      Pertinent Vitals/Pain Pain Assessment: Faces Faces Pain Scale: Hurts little more Pain Location: incision Pain Descriptors / Indicators: Aching Pain Intervention(s): Monitored during session;Premedicated before session;Repositioned     Hand Dominance Right   Extremity/Trunk Assessment Upper Extremity Assessment Upper Extremity Assessment: Overall WFL for tasks assessed   Lower Extremity Assessment Lower Extremity Assessment: Defer to PT evaluation       Communication Communication Communication: No difficulties   Cognition Arousal/Alertness: Awake/alert Behavior During Therapy: WFL for tasks assessed/performed Overall Cognitive Status: Within Functional Limits for tasks assessed                                     General Comments       Exercises     Shoulder Instructions      Home Living Family/patient expects to be discharged to:: Private residence Living Arrangements: Spouse/significant other Available Help at Discharge: Family;Available 24 hours/day Type of Home: Other(Comment)(condo) Home Access: Level entry     Home Layout: One level     Bathroom Shower/Tub: Teacher, early years/pre: Standard     Home Equipment: Cane - single point;Toilet riser;Adaptive equipment;Grab bars -  tub/shower Adaptive Equipment: Long-handled sponge;Reacher        Prior Functioning/Environment Level of Independence: Needs assistance    ADL's / Homemaking Assistance Needed: husband does housekeeping            OT Problem List:        OT Treatment/Interventions:      OT Goals(Current goals can be found in the care plan section) Acute Rehab OT Goals Patient Stated Goal: play with her grandkids  OT Frequency:     Barriers to D/C:            Co-evaluation              AM-PAC OT  "6 Clicks" Daily Activity     Outcome Measure Help from another person eating meals?: None Help from another person taking care of personal grooming?: None Help from another person toileting, which includes using toliet, bedpan, or urinal?: None Help from another person bathing (including washing, rinsing, drying)?: A Little Help from another person to put on and taking off regular upper body clothing?: None Help from another person to put on and taking off regular lower body clothing?: A Little 6 Click Score: 22   End of Session Equipment Utilized During Treatment: Back brace  Activity Tolerance: Patient tolerated treatment well Patient left: in bed;with call bell/phone within reach(EOB)  OT Visit Diagnosis: Pain                Time: [id number] OT Time Calculation (min): 21 min Charges:  OT General Charges $OT Visit: 1 Visit OT Evaluation $OT Eval Low Complexity: 1 Low  Nestor Lewandowsky, OTR/L Acute Rehabilitation Services Pager: [phone] Office: [phone]  Malka So 02/27/2020, 9:18 AM

## 2020-02-27 NOTE — Evaluation (Signed)
Physical Therapy Evaluation Patient Details Name: Cheryl Finley MRN: [id number] DOB: 1946/02/12 Today's Date: 02/27/2020   History of Present Illness  Pt is a 74 year old woman admitted on 02/25/20 for lateral and next day posterior fusion of L2-3. PMH: previous back sx, asthma, sleep apnea, HTN, DM, PVD, arthritis.    Clinical Impression  Pt presented sitting upright at EOB, awake and willing to participate in therapy session. Prior to admission, pt reported that she was independent with all functional mobility and ADLs. Pt lives with her husband in a single level home with a level entry. At the time of evaluation, pt overall moving very well without the need for physical assistance. Pt tolerated hallway ambulation with use of an AD with supervision for safety, no LOB. PT provided pt education re: back precautions, car transfers, home safety and a generalized walking program for pt to initiate upon d/c home. No further acute PT needs identified at this time. Pt signing off.     Follow Up Recommendations No PT follow up    Equipment Recommendations  None recommended by PT    Recommendations for Other Services       Precautions / Restrictions Precautions Precautions: Back Precaution Booklet Issued: Yes (comment) Precaution Comments: pt is familiar with precautions from previous spine sx Required Braces or Orthoses: Spinal Brace Spinal Brace: Thoracolumbosacral orthotic Restrictions Weight Bearing Restrictions: No      Mobility  Bed Mobility               General bed mobility comments: pt seated EOB upon arrival  Transfers Overall transfer level: Modified independent Equipment used: None                Ambulation/Gait Ambulation/Gait assistance: Supervision Gait Distance (Feet): 200 Feet Assistive device: None Gait Pattern/deviations: Step-through pattern;Decreased stride length Gait velocity: decreased   General Gait Details: no instability or  LOB  Stairs            Wheelchair Mobility    Modified Rankin (Stroke Patients Only)       Balance Overall balance assessment: No apparent balance deficits (not formally assessed)                                           Pertinent Vitals/Pain Pain Assessment: Faces Faces Pain Scale: Hurts little more Pain Location: incision Pain Descriptors / Indicators: Aching Pain Intervention(s): Monitored during session;Repositioned    Home Living Family/patient expects to be discharged to:: Private residence Living Arrangements: Spouse/significant other Available Help at Discharge: Family;Available 24 hours/day Type of Home: Other(Comment)(condo) Home Access: Level entry     Home Layout: One level Home Equipment: Cane - single point;Toilet riser;Adaptive equipment;Grab bars - tub/shower      Prior Function Level of Independence: Needs assistance      ADL's / Homemaking Assistance Needed: husband does housekeeping        Hand Dominance   Dominant Hand: Right    Extremity/Trunk Assessment   Upper Extremity Assessment Upper Extremity Assessment: Overall WFL for tasks assessed;Defer to OT evaluation    Lower Extremity Assessment Lower Extremity Assessment: Overall WFL for tasks assessed    Cervical / Trunk Assessment Cervical / Trunk Assessment: Other exceptions Cervical / Trunk Exceptions: s/p lumbar sx  Communication   Communication: No difficulties  Cognition Arousal/Alertness: Awake/alert Behavior During Therapy: WFL for tasks assessed/performed Overall Cognitive Status: Within  Functional Limits for tasks assessed                                        General Comments      Exercises     Assessment/Plan    PT Assessment Patent does not need any further PT services  PT Problem List         PT Treatment Interventions      PT Goals (Current goals can be found in the Care Plan section)  Acute Rehab PT  Goals Patient Stated Goal: play with her grandkids PT Goal Formulation: All assessment and education complete, DC therapy    Frequency     Barriers to discharge        Co-evaluation               AM-PAC PT "6 Clicks" Mobility  Outcome Measure Help needed turning from your back to your side while in a flat bed without using bedrails?: None Help needed moving from lying on your back to sitting on the side of a flat bed without using bedrails?: None Help needed moving to and from a bed to a chair (including a wheelchair)?: None Help needed standing up from a chair using your arms (e.g., wheelchair or bedside chair)?: None Help needed to walk in hospital room?: None Help needed climbing 3-5 steps with a railing? : None 6 Click Score: 24    End of Session Equipment Utilized During Treatment: Back brace Activity Tolerance: Patient tolerated treatment well Patient left: in bed;with call bell/phone within reach Nurse Communication: Mobility status PT Visit Diagnosis: Pain Pain - part of body: (back)    Time: [id number] PT Time Calculation (min) (ACUTE ONLY): 17 min   Charges:   PT Evaluation $PT Eval Low Complexity: 1 Low          Eduard Clos, PT, DPT  Acute Rehabilitation Services Pager [phone] Office [place] 02/27/2020, 1:25 PM

## 2020-03-04 NOTE — Discharge Summary (Signed)
Patient ID: Cheryl Finley MRN: [id number] DOB/AGE: 03-23-1946 74 y.o.  Admit date: 02/25/2020 Discharge date: 02/27/2020  Admission Diagnoses:  Active Problems:   Radiculopathy   Discharge Diagnoses:  Same  Past Medical History:  Diagnosis Date  . Anemia    using iron daily  . Arthritis    OA- knees, back, spine, hands   . Asthma   . Cancer [facility]) 2017   breast - pre-cancer  . Complication of anesthesia    hard to wake   . Diabetes mellitus without complication [facility]) [id number]   Dr. Nicholes Stairs- endocrinologist- with [facility]  . Family history of adverse reaction to anesthesia    Mother of pt. hard to wake up   . Heart murmur   . History of hiatal hernia   . Hyperlipemia   . Hypertension   . Hypothyroidism   . Peripheral vascular disease ([place]) 1981   DVT- bilateral LE- postop- hysterectomy  . Pneumonia    as a baby - double pneumonia - still has scarring in her lungs   . Sleep apnea    CPAP- anytime she rests    Surgeries: Procedure(s): LUMBAR 2- LUMBAR 3 POSTERIOR SPINAL FUSION WITH INSTRUMENTATION AND ALLOGRAFT on 02/26/2020   Consultants: None  Discharged Condition: Improved  Hospital Course: TIMECA VALIDO is an 74 y.o. female who was admitted 02/25/2020 for operative treatment of radiculopathy. Patient has severe unremitting pain that affects sleep, daily activities, and work/hobbies. After pre-op clearance the patient was taken to the operating room on 02/26/2020 and underwent  Procedure(s): LUMBAR 2- LUMBAR 3 POSTERIOR SPINAL FUSION WITH INSTRUMENTATION AND ALLOGRAFT.    Patient was given perioperative antibiotics:  Anti-infectives (From admission, onward)   Start     Dose/Rate Route Frequency Ordered Stop   02/25/20 1900  vancomycin (VANCOCIN) IVPB 1000 mg/200 mL premix     1,000 mg 200 mL/hr over 60 Minutes Intravenous  Once 02/25/20 1427 02/26/20 1509   02/25/20 0600  vancomycin (VANCOCIN) IVPB 1000 mg/200 mL premix     1,000 mg 200 mL/hr over 60 Minutes  Intravenous On call to O.R. 02/25/20 [id number] 02/26/20 1509       Patient was given sequential compression devices, early ambulation to prevent DVT.  Patient benefited maximally from hospital stay and there were no complications.    Recent vital signs: BP (!) 117/36 (BP Location: Left Arm)   Pulse 77   Temp 99 F (37.2 C)   Resp 18   Ht 5\' 6"  (1.676 m)   Wt 92.5 kg   SpO2 95%   BMI 32.93 kg/m    Discharge Medications:   Allergies as of 02/27/2020      Reactions   Milk-related Compounds    Causes a lot of mucus and induces bronchitis   Penicillins    Unknown, infant allergy pt received ancef intraoperatively on 02/26/2020 without any issues noted   Percodan [oxycodone-aspirin]    Causes severe headaches    Sulfa Antibiotics    Unknown, infant allergy   Tape       Medication List    TAKE these medications   amLODipine 5 MG tablet Commonly known as: NORVASC Take 5 mg by mouth daily.   atorvastatin 40 MG tablet Commonly known as: LIPITOR Take 40 mg by mouth at bedtime.   azithromycin 250 MG tablet Commonly known as: ZITHROMAX Take 2 tabs PO x 1 dose, then 1 tab PO QD x 4 days   benzonatate 200 MG capsule Commonly  known as: TESSALON Take 1 capsule (200 mg total) by mouth at bedtime. Take as needed for cough   budesonide-formoterol 160-4.5 MCG/ACT inhaler Commonly known as: SYMBICORT Inhale 2 puffs into the lungs 2 (two) times daily.   CALCIUM 600+D PO Take 1 tablet by mouth 2 (two) times daily.   CENTRUM SILVER PO Take 1 tablet by mouth at bedtime.   diazepam 5 MG tablet Commonly known as: VALIUM Take 1 tablet (5 mg total) by mouth every 6 (six) hours as needed for muscle spasms.   diphenhydrAMINE 25 MG tablet Commonly known as: BENADRYL Take 25 mg by mouth in the morning and at bedtime.   doxycycline 100 MG capsule Commonly known as: VIBRAMYCIN Take 1 capsule (100 mg total) by mouth 2 (two) times daily.   fenofibrate 145 MG tablet Commonly known as:  TRICOR Take 145 mg by mouth at bedtime.   ferrous sulfate 324 MG Tbec Take 324 mg by mouth daily after lunch.   fluticasone 50 MCG/ACT nasal spray Commonly known as: FLONASE Place 1 spray into both nostrils daily.   folic acid A999333 MCG tablet Commonly known as: FOLVITE Take 400 mcg by mouth daily.   furosemide 20 MG tablet Commonly known as: LASIX Take 20 mg by mouth daily as needed for fluid.   glimepiride 4 MG tablet Commonly known as: AMARYL Take 4 mg by mouth 2 (two) times daily.   HAIR/SKIN/NAILS PO Take 1 tablet by mouth in the morning and at bedtime.   insulin glargine 100 UNIT/ML injection Commonly known as: LANTUS Inject 30 Units into the skin at bedtime.   insulin lispro 100 UNIT/ML injection Commonly known as: HUMALOG Inject 1-15 Units into the skin daily as needed for high blood sugar (above 150). This order is for the a.m. but if she has a  Fasting  BS of > 150 in the a.m. & uses the humalog, then she must continue on that day to check BS before each meal & continue to use the Humalog per the sliding scale .   levothyroxine 125 MCG tablet Commonly known as: SYNTHROID Take 125 mcg by mouth daily before breakfast.   melatonin 5 MG Tabs Take 10 mg by mouth at bedtime.   metFORMIN 500 MG tablet Commonly known as: GLUCOPHAGE Take 1,000 mg by mouth 2 (two) times daily with a meal.   montelukast 10 MG tablet Commonly known as: SINGULAIR Take 10 mg by mouth at bedtime.   oxyCODONE-acetaminophen 5-325 MG tablet Commonly known as: PERCOCET/ROXICET Take 1-2 tablets by mouth every 4 (four) hours as needed for moderate pain or severe pain.   Ozempic (1 MG/DOSE) 2 MG/1.5ML Sopn Generic drug: Semaglutide (1 MG/DOSE) Inject 1 mg into the skin every Thursday.   spironolactone 50 MG tablet Commonly known as: ALDACTONE Take 50 mg by mouth daily.   telmisartan 40 MG tablet Commonly known as: MICARDIS Take 40 mg by mouth at bedtime.   vitamin C 1000 MG  tablet Take 1,000 mg by mouth 2 (two) times daily.       Diagnostic Studies: DG Lumbar Spine 2-3 Views  Result Date: 02/26/2020 CLINICAL DATA:  L2-3 posterior fusion. EXAM: DG C-ARM 1-60 MIN; LUMBAR SPINE - 2-3 VIEW CONTRAST:  None. FLUOROSCOPY TIME:  Fluoroscopy Time:  0 minutes 40 seconds Radiation Exposure Index (if provided by the fluoroscopic device): 36.5 mGy Number of Acquired Spot Images: 2 COMPARISON:  02/25/2020 as well as MRI 01/26/2019 FINDINGS: Examination demonstrates evidence of patient's evidence of patient's left lateral  fusion hardware with interbody fusion at the L2-3 level unchanged. There is been revision of patient's posterior fusion hardware at the L3-4 level as this posterior fusion hardware now extends from L2-L4 with bilateral pedicle screws. Hardware is intact and normally located. Remainder the exam is unchanged. IMPRESSION: Interval revision of posterior fusion hardware which now extends from L2-L4 with bilateral pedicle screws intact. Stable existing left lateral fusion hardware/interbody fusion at the L2-3 level. Electronically Signed   By: Marin Olp M.D.   On: 02/26/2020 10:39   DG Lumbar Spine 2-3 Views  Result Date: 02/25/2020 CLINICAL DATA:  L2-3 lumbar fusion. EXAM: LUMBAR SPINE - 2-3 VIEW; DG C-ARM 1-60 MIN COMPARISON:  MR lumbar spine 01/26/2020. FINDINGS: Frontal and lateral views of the lumbar spine are submitted. Numbering system utilized on 01/26/2020 is preserved. Previous L3-4 posterior lumbar interbody fusion with interbody spacer. Interval left lateral L2-3 fusion with interbody cage. IMPRESSION: 1. Left lateral L2-3 fusion. 2. Previous L3-4 posterior fusion. Electronically Signed   By: Lorin Picket M.D.   On: 02/25/2020 10:58   DG C-Arm 1-60 Min  Result Date: 02/26/2020 CLINICAL DATA:  L2-3 posterior fusion. EXAM: DG C-ARM 1-60 MIN; LUMBAR SPINE - 2-3 VIEW CONTRAST:  None. FLUOROSCOPY TIME:  Fluoroscopy Time:  0 minutes 40 seconds Radiation  Exposure Index (if provided by the fluoroscopic device): 36.5 mGy Number of Acquired Spot Images: 2 COMPARISON:  02/25/2020 as well as MRI 01/26/2019 FINDINGS: Examination demonstrates evidence of patient's evidence of patient's left lateral fusion hardware with interbody fusion at the L2-3 level unchanged. There is been revision of patient's posterior fusion hardware at the L3-4 level as this posterior fusion hardware now extends from L2-L4 with bilateral pedicle screws. Hardware is intact and normally located. Remainder the exam is unchanged. IMPRESSION: Interval revision of posterior fusion hardware which now extends from L2-L4 with bilateral pedicle screws intact. Stable existing left lateral fusion hardware/interbody fusion at the L2-3 level. Electronically Signed   By: Marin Olp M.D.   On: 02/26/2020 10:39   DG C-Arm 1-60 Min  Result Date: 02/25/2020 CLINICAL DATA:  L2-3 lumbar fusion. EXAM: LUMBAR SPINE - 2-3 VIEW; DG C-ARM 1-60 MIN COMPARISON:  MR lumbar spine 01/26/2020. FINDINGS: Frontal and lateral views of the lumbar spine are submitted. Numbering system utilized on 01/26/2020 is preserved. Previous L3-4 posterior lumbar interbody fusion with interbody spacer. Interval left lateral L2-3 fusion with interbody cage. IMPRESSION: 1. Left lateral L2-3 fusion. 2. Previous L3-4 posterior fusion. Electronically Signed   By: Lorin Picket M.D.   On: 02/25/2020 10:58   DG OR LOCAL ABDOMEN  Result Date: 02/25/2020 CLINICAL DATA:  Left-sided lateral interbody fusion L2-3 EXAM: OR LOCAL ABDOMEN COMPARISON:  Intraoperative fluoro films from earlier same day. FINDINGS: PA abdomen shows spinal fusion hardware, stable since intraoperative study earlier the same day. Surgical clips overlying the right upper quadrant are compatible with the clinical history of prior cholecystectomy. Lead wire seen on the extreme left lateral margin of the film. No unexpected radiopaque foreign body within the visualized  abdomen. IMPRESSION: Negative. Findings were discussed with Levada Dy, in the operating room, at approximately 1043 hours on 02/25/2020. Electronically Signed   By: Misty Stanley M.D.   On: 02/25/2020 10:44    Disposition: Discharge disposition: 01-Home or Self Care        POD #1 s/p lateral/posterior fusion, doing well  - up with PT/OT, encourage ambulation - Percocet for pain, Valium for muscle spasms -Scripts for pain sent to pharmacy electronically  -  D/C instructions sheet printed and in chart -D/C today  -F/U in office 2 weeks   Signed: Lennie Muckle Jahdai Padovano 03/04/2020, 11:18 AM

## 2020-03-08 ENCOUNTER — Encounter: Payer: Self-pay | Admitting: *Deleted

## 2020-03-10 ENCOUNTER — Encounter: Payer: Self-pay | Admitting: *Deleted

## 2021-08-24 ENCOUNTER — Other Ambulatory Visit: Payer: Self-pay | Admitting: Orthopedic Surgery

## 2021-08-24 DIAGNOSIS — M5416 Radiculopathy, lumbar region: Secondary | ICD-10-CM

## 2021-08-27 ENCOUNTER — Ambulatory Visit
Admission: RE | Admit: 2021-08-27 | Discharge: 2021-08-27 | Disposition: A | Discharge status: Home / Self Care | Payer: Medicare Other | Source: Ambulatory Visit | Attending: Orthopedic Surgery | Admitting: Orthopedic Surgery

## 2021-08-27 ENCOUNTER — Other Ambulatory Visit: Payer: Self-pay

## 2021-08-27 DIAGNOSIS — M5416 Radiculopathy, lumbar region: Secondary | ICD-10-CM

## 2021-08-27 IMAGING — MR MR LUMBAR SPINE WO/W CM
4 of 7 series · 26 of 48 positions shown · IV contrast (20ml Multihance)
Comparison: Radiographs [DATE].  MRI [DATE].

CLINICAL DATA: Intense low back pain radiating into the right groin
and superior thigh for 5 weeks. Previous lumbar fusion.

EXAM:
MRI LUMBAR SPINE WITHOUT AND WITH CONTRAST
TECHNIQUE: Multiplanar and multiecho pulse sequences of the lumbar spine were
obtained without and with intravenous contrast.
CONTRAST:  20mL MULTIHANCE GADOBENATE DIMEGLUMINE 529 MG/ML IV SOLN

[Series 4: T1 · sagittal · 4.0mm · 0.55mm/px · 4 of 17 slices shown (1 of 2)]
[im 1/17]
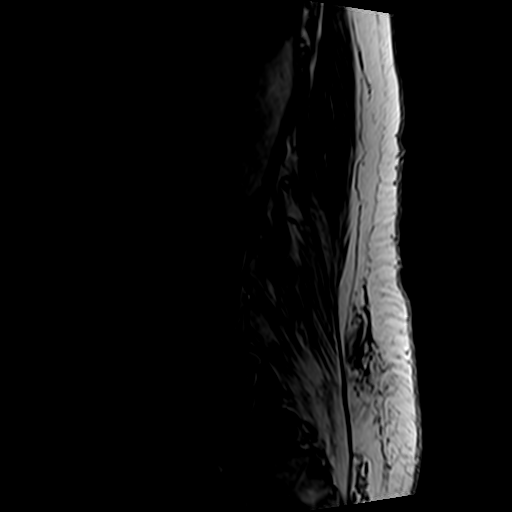
[im 6/17]
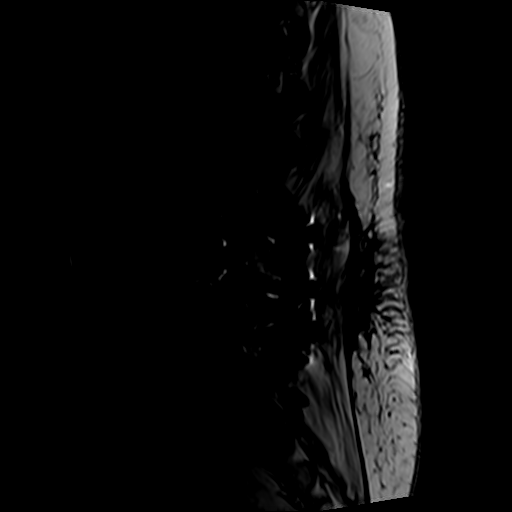
[im 11/17]
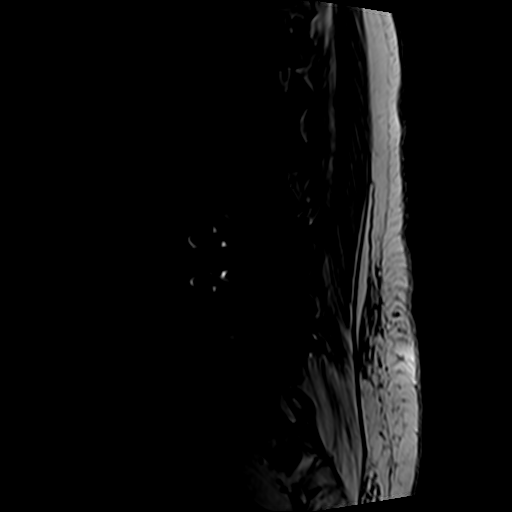
[im 17/17]
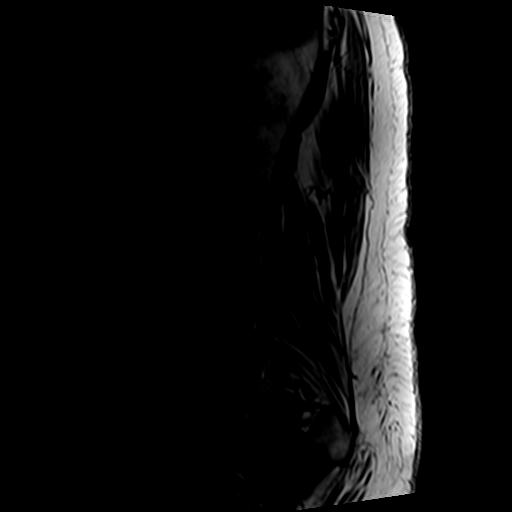

[Series 5: T2 · axial · 4.0mm · 0.70mm/px · z∈[-30,+194]mm · 11 of 44 slices shown]
[im 1/44]
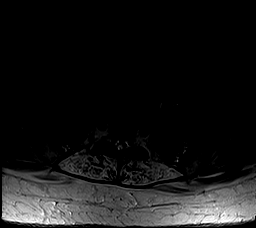
[im 5/44]
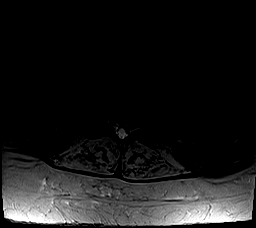
[im 9/44]
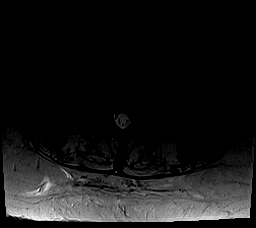
[im 13/44]
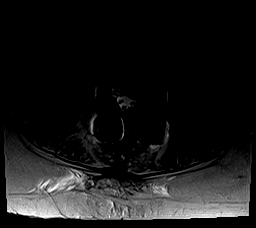
[im 18/44]
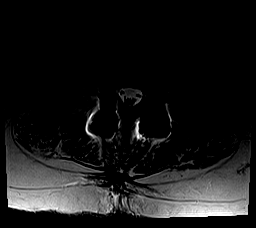
[im 22/44]
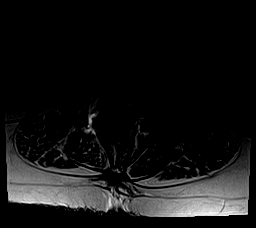
[im 26/44]
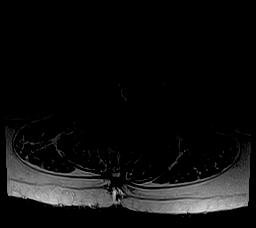
[im 31/44]
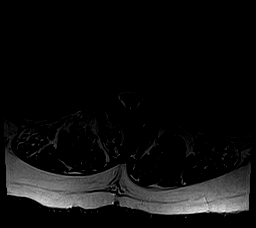
[im 35/44]
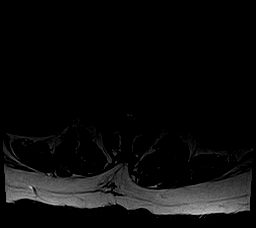
[im 39/44]
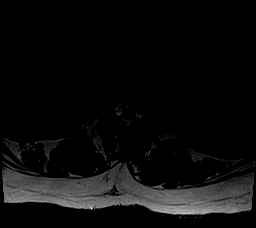
[im 44/44]
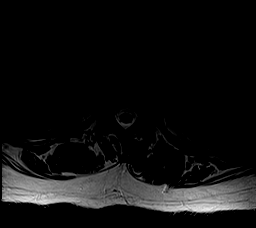

[Series 6: T1 · axial · 4.0mm · 0.35mm/px · z∈[-30,+168]mm · 7 of 44 slices shown (2 of 2)]
[im 1/44]
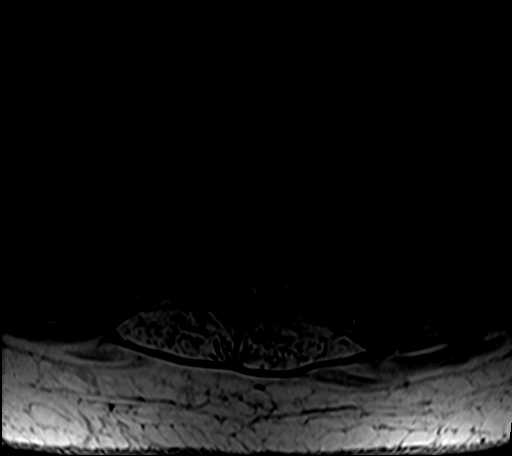
[im 5/44]
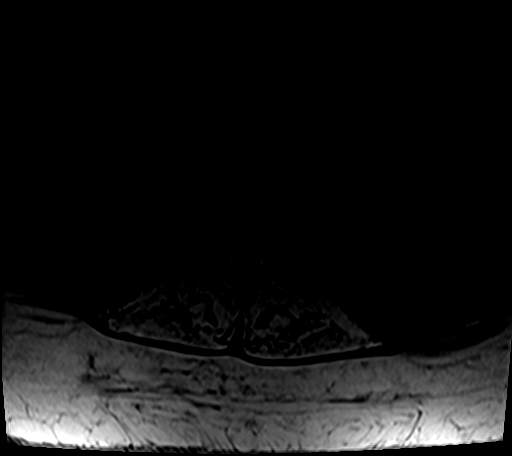
[im 9/44]
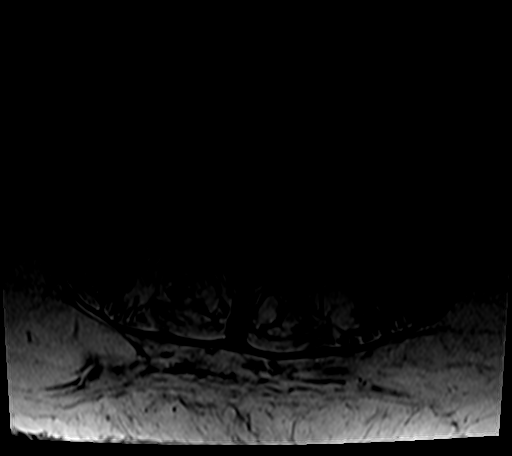
[im 13/44]
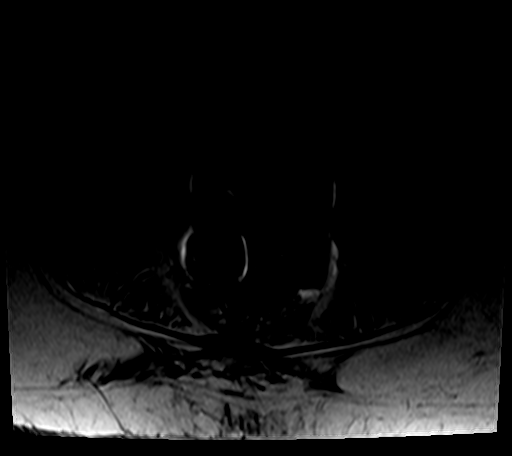
[im 18/44]
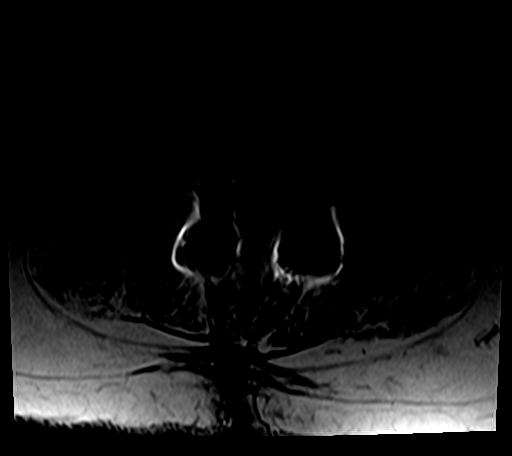
[im 22/44]
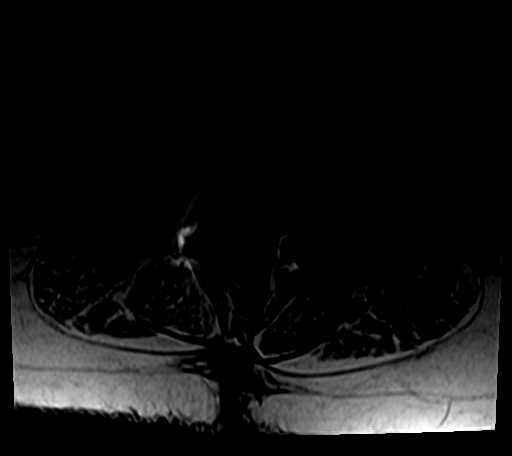
[im 39/44]
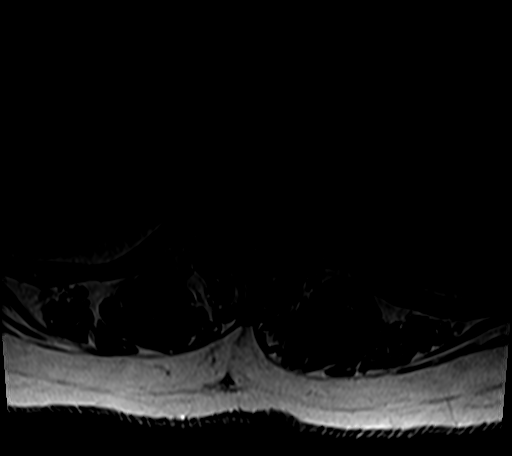

[Series 7: T2 post-contrast · sagittal · 4.0mm · 0.55mm/px · 4 of 17 slices shown]
[im 1/17]
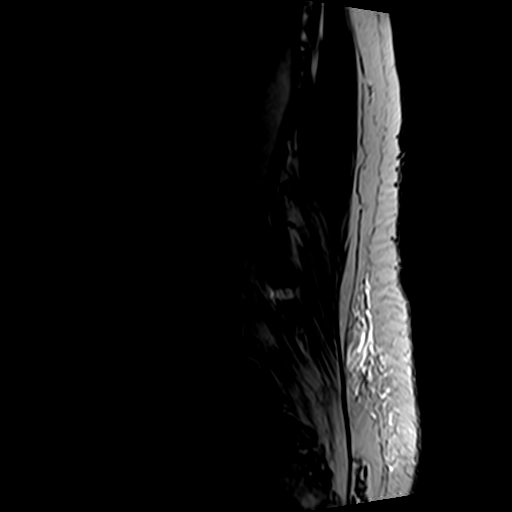
[im 6/17]
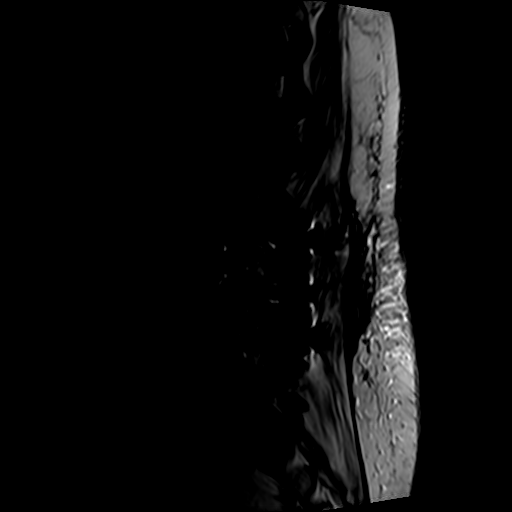
[im 11/17]
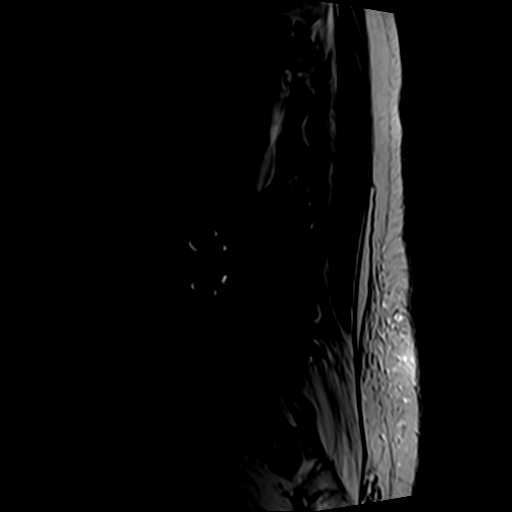
[im 17/17]
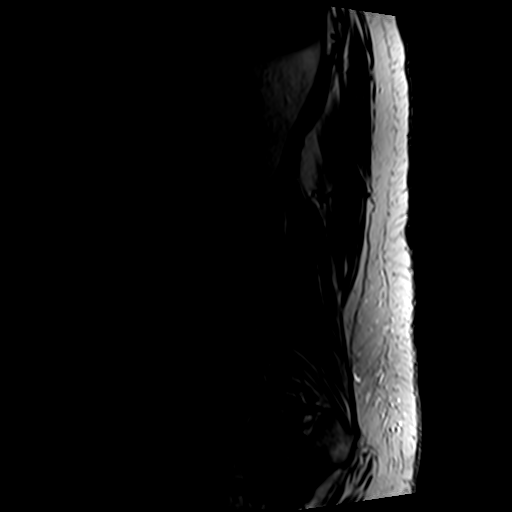

[26 of 48 positions shown; findings below may reference images not displayed]

FINDINGS: Segmentation: Transitional lumbosacral anatomy. In keeping with
previous numbering, the transitional segment is assigned L5.

Alignment:  Stable chronic degenerative anterolisthesis at L4-5.

Vertebrae: No worrisome osseous lesion, acute fracture or pars
defect. Since the previous study, the patient has undergone superior
extension of the lumbar fusion across the L2-3 disc space. No
abnormality of the new hardware identified. Mild sacroiliac
degenerative changes bilaterally.

Conus medullaris: Extends to the L1 level and appears normal.

Paraspinal and other soft tissues: Postsurgical changes in the
posterior paraspinal soft tissues. No focal fluid collection or
suspicious enhancement.

Disc levels:

T11-12: Only imaged in the sagittal plane. Loss of disc height with
annular disc bulging and endplate osteophytes contributing to mild
spinal stenosis and mild foraminal narrowing, grossly unchanged.

T11-12: No significant findings.

T12-L1: New moderate-sized right foraminal disc extrusion, best seen
on the axial images. This would be expected to cause right T12 nerve
root encroachment. No cord deformity.

L1-2: Mildly progressive annular disc bulging with moderate facet
and ligamentous hypertrophy. Mild spinal stenosis with mild lateral
recess narrowing bilaterally. The foramina appear sufficiently
patent.

L2-3: Interval posterior decompression with PLIF. Improved patency
of the spinal canal. Mild foraminal narrowing bilaterally.

L3-4: Status post remote posterior decompression and fusion. Stable
annular disc bulging eccentric to the right and mild bilateral facet
hypertrophy. Stable mild foraminal narrowing bilaterally.

L4-5: Chronic anterolisthesis with chronic loss of disc height and
probable ankylosis of the facet joints. No significant spinal
stenosis or nerve root encroachment.

L5-S1: As numbered, this is a transitional level without acquired
abnormality. No spinal stenosis.
IMPRESSION: 1. Transitional lumbosacral anatomy. Same numbering applied from
previous studies.
2. Since previous MRI of [4E], superior extension of the lumbar
fusion across the L2-3 disc space. There is improved patency of the
spinal canal at L2-3.
3. New moderate-sized right foraminal disc extrusion at T12-L1,
expected to cause right T12 nerve root encroachment.
4. Mild adjacent segment disease at L1-2 with mild multifactorial
spinal stenosis and lateral recess narrowing bilaterally.
5. Stable remote postsurgical changes at L3-4. No significant spinal
stenosis or nerve root encroachment from L3-4 through L5-S1.

## 2021-08-27 MED ORDER — GADOBENATE DIMEGLUMINE 529 MG/ML IV SOLN
20.0000 mL | Freq: Once | INTRAVENOUS | Status: AC | PRN
  Administered 2021-08-27: 20 mL via INTRAVENOUS

## 2021-09-05 ENCOUNTER — Other Ambulatory Visit: Payer: Self-pay | Admitting: Orthopedic Surgery

## 2021-09-05 DIAGNOSIS — M545 Low back pain, unspecified: Secondary | ICD-10-CM

## 2021-09-06 ENCOUNTER — Other Ambulatory Visit: Payer: Self-pay | Admitting: Orthopedic Surgery

## 2021-09-06 DIAGNOSIS — M5416 Radiculopathy, lumbar region: Secondary | ICD-10-CM

## 2021-09-08 ENCOUNTER — Ambulatory Visit
Admission: RE | Admit: 2021-09-08 | Discharge: 2021-09-08 | Disposition: A | Discharge status: Home / Self Care | Payer: Medicare Other | Source: Ambulatory Visit | Attending: Orthopedic Surgery | Admitting: Orthopedic Surgery

## 2021-09-08 ENCOUNTER — Other Ambulatory Visit: Payer: Self-pay

## 2021-09-08 DIAGNOSIS — M545 Low back pain, unspecified: Secondary | ICD-10-CM

## 2021-09-08 IMAGING — XA DG EPIDURAL NERVE ROOT
2 series · 2 of 2 positions shown · IV contrast (isovue)
Comparison: MRI [DATE]

CLINICAL DATA: Right foraminal disc herniation T12-L1 with
right-sided radicular pain.

EXAM:
Right T12 nerve root block and transforaminal epidural
TECHNIQUE: The overlying skin was prepped with Betadine and draped in sterile
fashion. Skin anesthesia was carried [DATE]% Lidocaine. A
curved 22 gauge spinal needle was directed into the superior ventral
neural foramen on the right at T12-L1 injection of a few drops of
Isovue 200 demonstrates spread outlining the right T12 nerve root.
No intravascular extension. 80 mg Depo-Medrol and 1.5 cc 1%
Lidocaine were subsequently administered. No apparent complication.
The patient tolerated the procedure without difficulty and was
transferred to recovery in excellent condition.
FLUOROSCOPY TIME:  1 minutes 6 seconds. 96.03 micro gray meter
squared

[Series 1: ortho standard · 1 of 1 slices shown (1 of 2)]
[im 1/1]
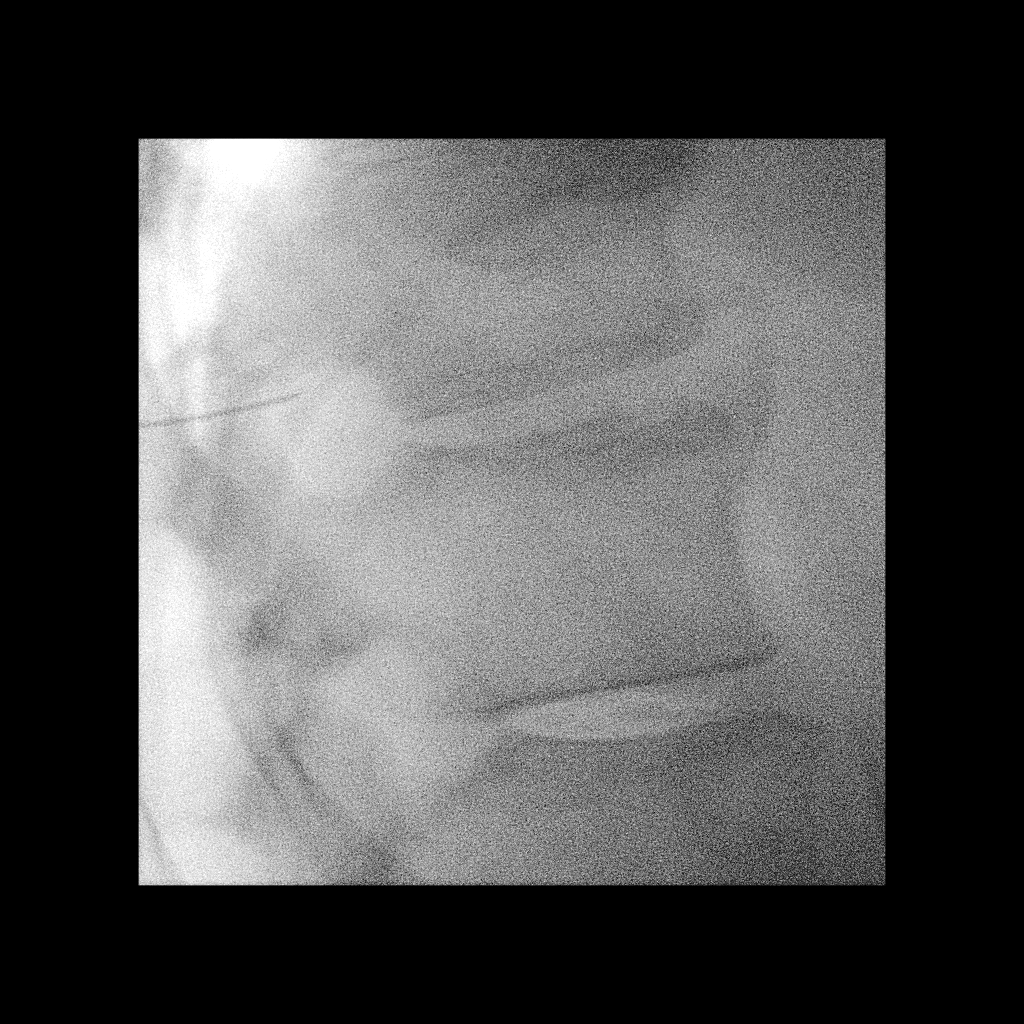

[Series 2: ortho standard · 1 of 1 slices shown (2 of 2)]
[im 1/1]
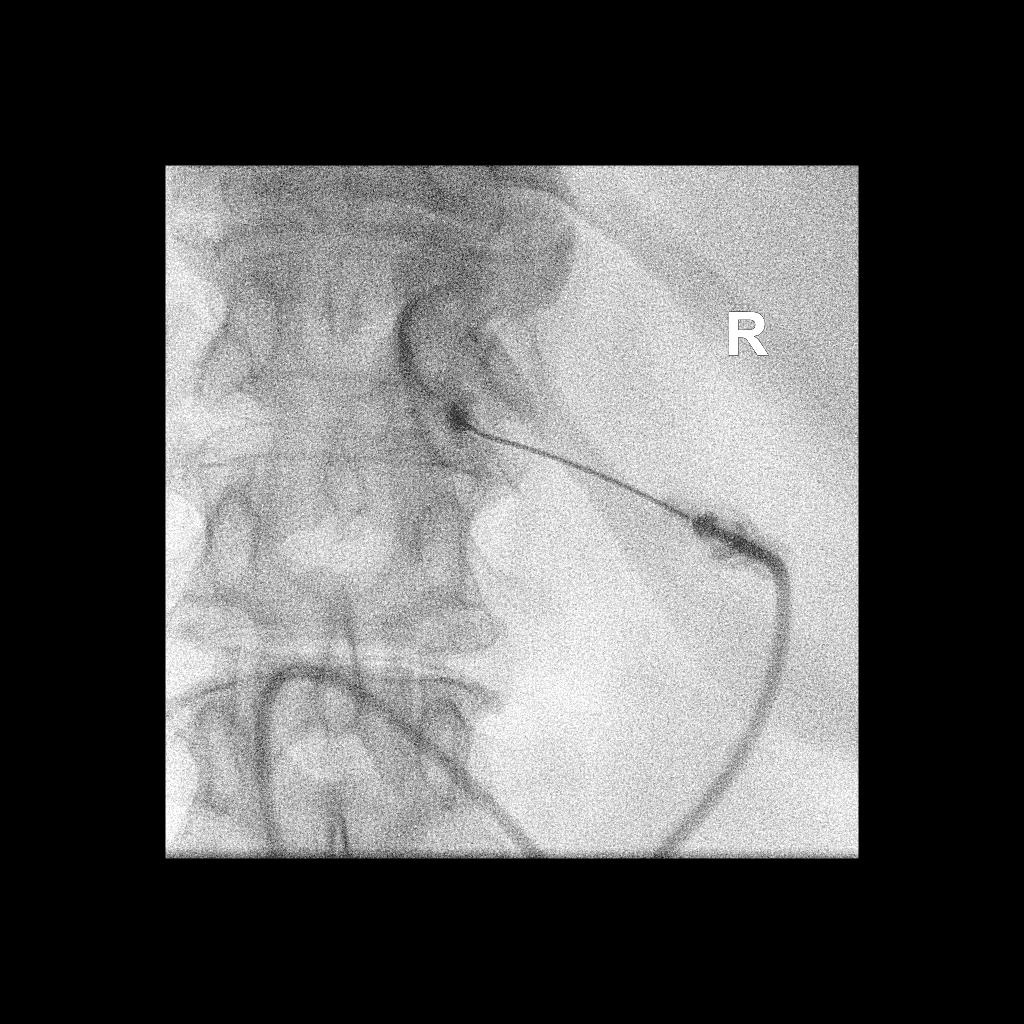

[2 of 2 positions shown; findings below may reference images not displayed]

IMPRESSION: Technically successful right T12 selective nerve root block and
transforaminal epidural steroid injection.

## 2021-09-08 MED ORDER — IOPAMIDOL (ISOVUE-M 200) INJECTION 41%
1.0000 mL | Freq: Once | INTRAMUSCULAR | Status: AC
  Administered 2021-09-08: 1 mL via EPIDURAL

## 2021-09-08 MED ORDER — DIAZEPAM 5 MG PO TABS
5.0000 mg | ORAL_TABLET | Freq: Once | ORAL | Status: AC
  Administered 2021-09-08: 5 mg via ORAL

## 2021-09-08 MED ORDER — METHYLPREDNISOLONE ACETATE 40 MG/ML INJ SUSP (RADIOLOG
80.0000 mg | Freq: Once | INTRAMUSCULAR | Status: AC
  Administered 2021-09-08: 80 mg via EPIDURAL

## 2021-09-08 NOTE — Discharge Instructions (Signed)
Post Procedure Spinal Discharge Instruction Sheet  You may resume a regular diet and any medications that you routinely take (including pain medications) unless otherwise noted by MD.  No driving day of procedure.  Light activity throughout the rest of the day.  Do not do any strenuous work, exercise, bending or lifting.  The day following the procedure, you can resume normal physical activity but you should refrain from exercising or physical therapy for at least three days thereafter.  You may apply ice to the injection site, 20 minutes on, 20 minutes off, as needed. Do not apply ice directly to skin.    Common Side Effects:  Headaches- take your usual medications as directed by your physician.  Increase your fluid intake.  Caffeinated beverages may be helpful.  Lie flat in bed until your headache resolves.  Restlessness or inability to sleep- you may have trouble sleeping for the next few days.  Ask your referring physician if you need any medication for sleep.  Facial flushing or redness- should subside within a few days.  Increased pain- a temporary increase in pain a day or two following your procedure is not unusual.  Take your pain medication as prescribed by your referring physician.  Leg cramps  Please contact our office at [phone] for the following symptoms: Fever greater than 100 degrees. Headaches unresolved with medication after 2-3 days. Increased swelling, pain, or redness at injection site.   Thank you for visiting [facility] today.    YOU MAY RESUME YOUR ASPIRIN ANYTIME AFTER INJECTION

## 2021-09-15 ENCOUNTER — Other Ambulatory Visit: Payer: Self-pay | Admitting: Orthopedic Surgery

## 2021-10-07 NOTE — Progress Notes (Signed)
Surgical Instructions   Your procedure is scheduled on Thursday 10/13/2021.  Report to [facility] Main Entrance "A" at 05:30 A.M., then check in with the Admitting office.  Call [phone] if you have problems or questions between now and the morning of surgery:   Remember: Do not eat after midnight the night before your surgery  You may drink clear liquids until 04:30a.m. the morning of your surgery.   Clear liquids allowed are: Water, Diet Non-Citrus Juices (without pulp) - Apple or Cranberry, Diet Carbonated Beverages, Clear Tea, Black Coffee Only (NO MILK, CREAM, or POWDERED CREAMER of any kind), and Low Sugar Gatorade.    Please complete your PRE-SURGERY G2 that was provided to you by 04:30 a.m. the morning of surgery.  Please, if able, drink it in one sitting. DO NOT SIP.    Take these medicines the morning of surgery with A SIP OF WATER:  Amlodipine (Norvasc) Budesonide- Formoterol (Symbicort) - Please bring all inhalers with you the day of surgery.  Levothyroxine (Synthroid)  If needed you may take these medications the morning of surgery: Albuterol (Ventolin HFA) inhaler - Please bring all inhalers with you the day of surgery Hydrocodone- acetaminophen (Norco)  As of today, STOP taking any Meloxicam (Mobic), Aspirin (unless otherwise instructed by your surgeon) or Aspirin-containing products; NSAIDS - Aleve, Naproxen, Ibuprofen, Motrin, Advil, Goody's, BC's, all herbal medications, fish oil, and all vitamins.  Follow your surgeon's instructions on when to stop Aspirin.  If no instructions were given by your surgeon then you will need to call the office to get those instructions.     WHAT DO I DO ABOUT MY DIABETES MEDICATION?  Dapagliflozin-Metformin Merleen Nicely) The day before surgery, do NOT take. The morning of surgery, do NOT take.  Glimepride (Amaryl) The day before surgery, take morning dose only. The Morning of surgery, do NOT take.  Glipizide (Glucotrol XL) The  day before surgery, take morning dose only. The Morning of surgery, do NOT take.  Insulin Glargine (Toujeo) THE NIGHT BEFORE SURGERY, take 25 units of insulin (50% of usual dose)   The day of surgery, do not take diabetes injectables, including Semaglutide (Ozempic).    HOW TO MANAGE YOUR DIABETES BEFORE AND AFTER SURGERY  Why is it important to control my blood sugar before and after surgery? Improving blood sugar levels before and after surgery helps healing and can limit problems. A way of improving blood sugar control is eating a healthy diet by:  Eating less sugar and carbohydrates  Increasing activity/exercise  Talking with your doctor about reaching your blood sugar goals High blood sugars (greater than 180 mg/dL) can raise your risk of infections and slow your recovery, so you will need to focus on controlling your diabetes during the weeks before surgery. Make sure that the doctor who takes care of your diabetes knows about your planned surgery including the date and location.  How do I manage my blood sugar before surgery? Check your blood sugar at least 4 times a day, starting 2 days before surgery, to make sure that the level is not too high or low.  Check your blood sugar the morning of your surgery when you wake up and every 2 hours until you get to the Short Stay unit.  If your blood sugar is less than 70 mg/dL, you will need to treat for low blood sugar: Do not take insulin. Treat a low blood sugar (less than 70 mg/dL) with  cup of clear juice (cranberry or apple),  4 glucose tablets, OR glucose gel. Recheck blood sugar in 15 minutes after treatment (to make sure it is greater than 70 mg/dL). If your blood sugar is not greater than 70 mg/dL on recheck, call [phone] for further instructions. Report your blood sugar to the short stay nurse when you get to Short Stay.  If you are admitted to the hospital after surgery: Your blood sugar will be checked by the staff  and you will probably be given insulin after surgery (instead of oral diabetes medicines) to make sure you have good blood sugar levels. The goal for blood sugar control after surgery is 80-180 mg/dL.    After your pre-procedure COVID test  You are not required to quarantine however you are required to wear a well-fitting mask when you are out and around people not in your household.  If your mask becomes wet or soiled, replace with a new one.  Wash your hands often with soap and water for 20 seconds or clean your hands with an alcohol-based hand sanitizer that contains at least 60% alcohol.  Do not share personal items.  Notify your provider: if you are in close contact with someone who has COVID  or if you develop a fever of 100.4 or greater, sneezing, cough, sore throat, shortness of breath or body aches.          Do not wear jewelry or makeup  Do not wear lotions, powders, perfumes, or deodorant.  Do not shave 48 hours prior to surgery.    Do not wear nail polish, gel polish, artificial nails, or any other type of covering on natural nails including fingernails and toenails. If patients have artificial nails, gel coating, etc. that need to be removed by a nail salon please have this removed prior to surgery or surgery may need to be canceled/delayed if the surgeon/ anesthesia feels like the patient is unable to be adequately monitored.  Do not bring valuables to the hospital - [facility] is not responsible for any belongings or valuables.  Do NOT Smoke (Tobacco/Vaping) or drink Alcohol 24 hours prior to your procedure  If you use a CPAP at night, please bring your mask for your overnight stay.   Contacts, glasses, hearing aids, dentures or partials may not be worn into surgery, please bring cases for these belongings   For patients admitted to the hospital, discharge time will be determined by your treatment team.   Patients discharged the day of surgery will not be allowed  to drive home, and someone needs to stay with them for 24 hours.  NO VISITORS WILL BE ALLOWED IN PRE-OP WHERE PATIENTS ARE PREPPED FOR SURGERY.  ONLY 1 SUPPORT PERSON MAY BE PRESENT IN THE WAITING ROOM WHILE YOU ARE IN SURGERY.  IF YOU ARE TO BE ADMITTED, ONCE YOU ARE IN YOUR ROOM YOU WILL BE ALLOWED TWO (2) VISITORS. 1 (ONE) VISITOR MAY STAY OVERNIGHT BUT MUST ARRIVE TO THE ROOM BY 8pm.  Minor children may have two parents present. Special consideration for safety and communication needs will be reviewed on a case by case basis.  Special instructions:    Oral Hygiene is also important to reduce your risk of infection.  Remember - BRUSH YOUR TEETH THE MORNING OF SURGERY WITH YOUR REGULAR TOOTHPASTE   [place]- Preparing For Surgery  Before surgery, you can play an important role. Because skin is not sterile, your skin needs to be as free of germs as possible. You can reduce the number of  germs on your skin by washing with CHG (chlorahexidine gluconate) Soap before surgery.  CHG is an antiseptic cleaner which kills germs and bonds with the skin to continue killing germs even after washing.     Please do not use if you have an allergy to CHG or antibacterial soaps. If your skin becomes reddened/irritated stop using the CHG.  Do not shave (including legs and underarms) for at least 48 hours prior to first CHG shower. It is OK to shave your face.  Please follow these instructions carefully.     Shower the NIGHT BEFORE SURGERY and the MORNING OF SURGERY with CHG Soap.   If you chose to wash your hair, wash your hair first as usual with your normal shampoo. After you shampoo, rinse your hair and body thoroughly to remove the shampoo.    Then [employer] and genitals (private parts) with your normal soap and rinse thoroughly to remove soap.  Next use the CHG Soap as you would any other liquid soap. You can apply CHG directly to the skin and wash gently with a clean washcloth.   Apply the CHG  Soap to your body ONLY FROM THE NECK DOWN.  Do not use on open wounds or open sores. Avoid contact with your eyes, ears, mouth and genitals (private parts). Wash Face and genitals (private parts)  with your normal soap.   Wash thoroughly, paying special attention to the area where your surgery will be performed.  Thoroughly rinse your body with warm water from the neck down.  DO NOT shower/wash with your normal soap after using and rinsing off the CHG Soap.  Pat yourself dry with a CLEAN TOWEL.  Wear CLEAN PAJAMAS to bed the night before surgery  Place CLEAN SHEETS on your bed the night before your surgery  DO NOT SLEEP WITH PETS.   Day of Surgery:  Take a shower with CHG soap. Wear Clean/Comfortable clothing the morning of surgery Do not apply any deodorants/lotions.   Remember to brush your teeth WITH YOUR REGULAR TOOTHPASTE.   Please read over the fact sheets that you were given.

## 2021-10-10 ENCOUNTER — Encounter (HOSPITAL_COMMUNITY)
Admission: RE | Admit: 2021-10-10 | Discharge: 2021-10-10 | Disposition: A | Discharge status: Home / Self Care | Payer: Medicare Other | Source: Ambulatory Visit | Attending: Orthopedic Surgery | Admitting: Orthopedic Surgery

## 2021-10-10 ENCOUNTER — Encounter (HOSPITAL_COMMUNITY): Payer: Self-pay

## 2021-10-10 ENCOUNTER — Other Ambulatory Visit: Payer: Self-pay

## 2021-10-10 VITALS — BP 144/71 | HR 88 | Temp 98.2°F | Resp 18 | Ht 62.0 in | Wt 197.6 lb

## 2021-10-10 DIAGNOSIS — Z79899 Other long term (current) drug therapy: Secondary | ICD-10-CM | POA: Insufficient documentation

## 2021-10-10 DIAGNOSIS — G4733 Obstructive sleep apnea (adult) (pediatric): Secondary | ICD-10-CM | POA: Insufficient documentation

## 2021-10-10 DIAGNOSIS — M4800 Spinal stenosis, site unspecified: Secondary | ICD-10-CM | POA: Insufficient documentation

## 2021-10-10 DIAGNOSIS — Z20822 Contact with and (suspected) exposure to covid-19: Secondary | ICD-10-CM | POA: Insufficient documentation

## 2021-10-10 DIAGNOSIS — I10 Essential (primary) hypertension: Secondary | ICD-10-CM | POA: Insufficient documentation

## 2021-10-10 DIAGNOSIS — E785 Hyperlipidemia, unspecified: Secondary | ICD-10-CM | POA: Insufficient documentation

## 2021-10-10 DIAGNOSIS — Z01818 Encounter for other preprocedural examination: Secondary | ICD-10-CM

## 2021-10-10 DIAGNOSIS — Z86718 Personal history of other venous thrombosis and embolism: Secondary | ICD-10-CM | POA: Insufficient documentation

## 2021-10-10 HISTORY — DX: Unspecified asthma, uncomplicated: J45.909

## 2021-10-10 HISTORY — DX: Unspecified macular degeneration: H35.30

## 2021-10-10 LAB — SURGICAL PCR SCREEN
MRSA, PCR: NEGATIVE
Staphylococcus aureus: NEGATIVE

## 2021-10-10 LAB — BASIC METABOLIC PANEL
Anion gap: 11 (ref 5–15)
BUN: 19 mg/dL (ref 8–23)
CO2: 24 mmol/L (ref 22–32)
Calcium: 9.7 mg/dL (ref 8.9–10.3)
Chloride: 106 mmol/L (ref 98–111)
Creatinine, Ser: 0.77 mg/dL (ref 0.44–1.00)
GFR, Estimated: >60 mL/min (ref >60)
Glucose, Bld: 180 mg/dL — ABNORMAL HIGH (ref 70–99)
Potassium: 4 mmol/L (ref 3.5–5.1)
Sodium: 141 mmol/L (ref 135–145)

## 2021-10-10 LAB — HEMOGLOBIN A1C
Hgb A1c MFr Bld: 6.6 % — ABNORMAL HIGH (ref 4.8–5.6)
Mean Plasma Glucose: 142.72 mg/dL

## 2021-10-10 LAB — CBC
HCT: 40.6 % (ref 36.0–46.0)
Hemoglobin: 12 g/dL (ref 12.0–15.0)
MCH: 25.7 pg — ABNORMAL LOW (ref 26.0–34.0)
MCHC: 29.6 g/dL — ABNORMAL LOW (ref 30.0–36.0)
MCV: 86.9 fL (ref 80.0–100.0)
Platelets: 314 10*3/uL (ref 150–400)
RBC: 4.67 MIL/uL (ref 3.87–5.11)
RDW: 17.1 % — ABNORMAL HIGH (ref 11.5–15.5)
WBC: 7 10*3/uL (ref 4.0–10.5)
nRBC: 0 % (ref 0.0–0.2)

## 2021-10-10 LAB — SARS CORONAVIRUS 2 (TAT 6-24 HRS): SARS Coronavirus 2: NEGATIVE

## 2021-10-10 LAB — TYPE AND SCREEN
ABO/RH(D): O POS
Antibody Screen: NEGATIVE

## 2021-10-10 LAB — GLUCOSE, CAPILLARY: Glucose-Capillary: 216 mg/dL — ABNORMAL HIGH (ref 70–99)

## 2021-10-10 NOTE — Progress Notes (Signed)
PCP - Rene Kocher Cardiologist - [facility] Endocrinologist - Dr. Hilbert Corrigan Pulmonologist - Dr. Michaelle Copas  PPM/ICD - n/a Device Orders -  Rep Notified -   Chest x-ray - n/a EKG - 10/10/2021 Stress Test - patient states it was years ago in [place] ECHO - 08/16/2021 Cardiac Cath - patient denies  Sleep Study - yes, + for OSA CPAP - yes, wears nightly  Fasting Blood Sugar - 120-150s Checks Blood Sugar 1  time a day  Blood Thinner Instructions: Aspirin Instructions: patient stopped on 10/05/21  ERAS Protcol - clears until 0430 PRE-SURGERY Ensure or G2- G2 given to patient to be completed by 0430  COVID TEST- done at PAT appointment   Anesthesia review: yes, hx of heart murmur and recent echo  Patient denies shortness of breath, fever, cough and chest pain at PAT appointment   All instructions explained to the patient, with a verbal understanding of the material. Patient agrees to go over the instructions while at home for a better understanding. Patient also instructed to self quarantine after being tested for COVID-19. The opportunity to ask questions was provided.

## 2021-10-11 ENCOUNTER — Encounter (HOSPITAL_COMMUNITY): Payer: Self-pay

## 2021-10-11 NOTE — Progress Notes (Signed)
Anesthesia Chart Review:  Case: [id number] Date/Time: 10/13/21 0715   Procedure: THORACIC 12- LUMBAR 1, LUMBAR 1 - LUMBAR 2 DECOMPRESSION AND FUSION WITH INSTRUMENTATION AND ALLOGRAFT   Anesthesia type: General   Pre-op diagnosis: SPINAL STENOSIS   Location: [facility] OR ROOM 05 / [place]   Surgeons: Phylliss Bob, MD       DISCUSSION: Patient is a 76 year old female scheduled for the above procedure.  History includes never smoker, HTN, HLD, murmur/aortic stenosis (moderate AS 08/2021), DM2, thyroidectomy (for multi-nodular goiter ~ 2018, post-surgical hypothyroidism), hiatal hernia, anemia, asthmatic bronchitis, OSA (uses CPAP), DVT (BLE DVT post hysterectomy 1981), macular degeneration, spinal surgery (left L3-4 transforaminal lumbar interbody fusion/right L-4 posterolateral fusion 08/20/19; left L2-3 lateral interbody fusion 02/25/20; L2-3 PSF 02/26/20), right breat surgery (2017 for "precancer"), severe bilateral pneumonia (58 months old with "lung scarring"). Reported she and her mother are "hard to wake up" after anesthesia. BMI is consistent with obesity.   Last cardiology evaluation 08/09/21 by Nestor Lewandowsky, PA-C with [facility] for follow-up AS, previously mild by 2020 echo with 2-3 year follow-up recommended.  She was doing well without chest pain or significant exertional dyspnea.  No orthopnea, PND, edema. Echo ordered to reassess degree of AS, and one year follow-up planned. 08/16/21 echo showed moderate AS.   10/10/2021 preoperative COVID-19 test negative. Anesthesia team to evaluate on the day of surgery.    VS: BP (!) 144/71    Pulse 88    Temp 36.8 C    Resp 18    Ht 5\' 2"  (1.575 m)    Wt 89.6 kg    SpO2 100%    BMI 36.14 kg/m    PROVIDERS: Stacie Glaze, DO is PCP ([facility]) - Francetta Found, MD is endocrinologist ([facility])  - Loni Beckwith, MD is cardiologist [facility]) - Cristela Blue, MD is GI [facility]). Seen 07/07/21 for IDA with likely minor blood loss due to NSAIDS.  Taking iron. Will plan for endoscopy and colonoscopy in the future at her convenience, done on 08/19/21.  Rico Sheehan, MD is pulmonologist  [facility]). Last visit 02/03/21 with Flowers, Deanne Coffer, PA-C for follow-up mild persistent asthma, allergic rhinitis, and OSA (on CPAP). She was having an acute exacerbation, already on doxycyline and steroids per her PCP. No acute infiltrate on CXR. Steroid injection given with repeat prednisone taper and Zpack added.   LABS: Labs reviewed: Acceptable for surgery. (all labs ordered are listed, but only abnormal results are displayed)  Labs Reviewed  GLUCOSE, CAPILLARY - Abnormal; Notable for the following components:      Result Value   Glucose-Capillary 216 (*)    All other components within normal limits  HEMOGLOBIN A1C - Abnormal; Notable for the following components:   Hgb A1c MFr Bld 6.6 (*)    All other components within normal limits  BASIC METABOLIC PANEL - Abnormal; Notable for the following components:   Glucose, Bld 180 (*)    All other components within normal limits  CBC - Abnormal; Notable for the following components:   MCH 25.7 (*)    MCHC 29.6 (*)    RDW 17.1 (*)    All other components within normal limits  SURGICAL PCR SCREEN  SARS CORONAVIRUS 2 (TAT 6-24 HRS)  TYPE AND SCREEN    Spirometry 08/09/20 ([facility]): Impression: Normal spirometry and diffusion capacity without evidence of airflow obstruction or suggestion of restriction   IMAGES: MRI L-spine 08/27/21: IMPRESSION: 1. Transitional lumbosacral anatomy. Same numbering applied from previous studies.  2. Since previous MRI of 2021, superior extension of the lumbar fusion across the L2-3 disc space. There is improved patency of the spinal canal at L2-3. 3. New moderate-sized right foraminal disc extrusion at T12-L1, expected to cause right T12 nerve root encroachment. 4. Mild adjacent segment disease at L1-2 with mild multifactorial spinal stenosis and lateral  recess narrowing bilaterally. 5. Stable remote postsurgical changes at L3-4. No significant spinal stenosis or nerve root encroachment from L3-4 through L5-S1.    CT Abd/pelvis 08/15/21 ([facility]): IMPRESSION:  1. There is hepatomegaly. Liver margins are mildly irregular which may relate to cirrhosis.  2. Mild scattered diverticulosis. No diverticulitis seen.    Per 02/03/21 [facility] note, "CXR: 02/03/2021: Impression: No acute infiltrates"   EKG: 10/10/21: NSR   CV: Echo 08/16/21 ([facility]): IMPRESSION: Technically difficult but adequate 2D M-mode and color flow Doppler echocardiogram demonstrates normal left ventricular chamber size and contractility.  Ejection fraction and wall motion is normal.  2.  The aortic valve is moderately thickened.  Moderate aortic stenosis is noted with aortic valve area measuring between 1.3 to 1.7 cm. Peak and mean gradients of 19.000 and 13.000 mmHg. 3.  The mitral valve is pliable trace mitral insufficiency is noted  4.  The tricuspid valve is pliable trace tricuspid insufficiency is noted  5.  The pericardium is of normal thickness there is no effusion. - Comparison echo 07/14/19 (scanned under Media tab, Correspondence 02/25/20) showed mild AS with AVA 1.9 cm. Ao V2 max 222.6 cm/sec, Ao max PG 19.8 mmHg, Ao V2 mean 153.2 cm/sec, Ao mean PG 10.7 mmHg, Ao V2 VTI 40.5 cm.   Reported a remote history of stress test in [place].   Past Medical History:  Diagnosis Date   Age-related macular degeneration    Anemia    using iron daily   Arthritis    OA- knees, back, spine, hands    Asthma    Asthmatic bronchitis    Cancer ([place]) 2017   breast - pre-cancer   Complication of anesthesia    hard to wake    Diabetes mellitus without complication [facility]) [id number]   Dr. Nicholes Stairs- endocrinologist- with [facility]   Family history of adverse reaction to anesthesia    Mother of pt. hard to wake up    Heart murmur    Moderate Aortic Stenosis 08/16/21 echo  (Dr. Elonda Husky, [facility])   History of hiatal hernia    Hyperlipemia    Hypertension    Hypothyroidism    Peripheral vascular disease ([place]) 1981   DVT- bilateral LE- postop- hysterectomy   Pneumonia    as a baby - double pneumonia - still has scarring in her lungs    Sleep apnea    CPAP- anytime she rests    Past Surgical History:  Procedure Laterality Date   ABDOMINAL HYSTERECTOMY     ANTERIOR LAT LUMBAR FUSION Left 02/25/2020   Procedure: LEFT-LATERAL INTERBODY FUSION LUMBAR 2 - LUMBAR 3 WITH INSTRUMENTATION AND ALLOGRAFT;  Surgeon: Phylliss Bob, MD;  Location: [place];  Service: Orthopedics;  Laterality: Left;   BREAST SURGERY Right 2017   pathology- precancer   CERVICAL SPINE SURGERY     CHOLECYSTECTOMY     DILATION AND CURETTAGE OF UTERUS     EYE SURGERY Bilateral    cataracts removed.    hystersalpingogram     KNEE CARTILAGE SURGERY Bilateral    2015, 2020   PARTIAL HYSTERECTOMY     SHOULDER ARTHROSCOPY WITH OPEN ROTATOR CUFF  REPAIR Left    THYROIDECTOMY  unsure   TONSILLECTOMY     TRANSFORAMINAL LUMBAR INTERBODY FUSION (TLIF) WITH PEDICLE SCREW FIXATION 1 LEVEL Left 08/20/2019   Procedure: LEFT-SIDED LUMBAR 3-4 TRANSFORAMINAL LUMBAR INTERBODY FUSION WITH INSTRUMENTATION AND ALLOGRAFT;  Surgeon: Phylliss Bob, MD;  Location: [place];  Service: Orthopedics;  Laterality: Left;    MEDICATIONS:  albuterol (VENTOLIN HFA) 108 (90 Base) MCG/ACT inhaler   amLODipine (NORVASC) 5 MG tablet   Ascorbic Acid (VITAMIN C) 1000 MG tablet   aspirin EC 81 MG tablet   atorvastatin (LIPITOR) 40 MG tablet   Biotin w/ Vitamins C & E (HAIR/SKIN/NAILS PO)   budesonide-formoterol (SYMBICORT) 160-4.5 MCG/ACT inhaler   Calcium Carbonate-Vitamin D (CALCIUM 600+D PO)   cyclobenzaprine (FLEXERIL) 5 MG tablet   Dapagliflozin-metFORMIN HCl ER (XIGDUO XR) 01-999 MG TB24   fenofibrate (TRICOR) 145 MG tablet   ferrous sulfate 325 (65 FE) MG tablet   Flaxseed, Linseed, (FLAX SEED OIL) 1000 MG CAPS    fluticasone (FLONASE) 50 MCG/ACT nasal spray   folic acid (FOLVITE) 401 MCG tablet   furosemide (LASIX) 20 MG tablet   glimepiride (AMARYL) 4 MG tablet   glipiZIDE (GLUCOTROL XL) 5 MG 24 hr tablet   HYDROcodone-acetaminophen (NORCO) 10-325 MG tablet   insulin glargine, 2 Unit Dial, (TOUJEO MAX SOLOSTAR) 300 UNIT/ML Solostar Pen   levothyroxine (SYNTHROID) 125 MCG tablet   Melatonin 5 MG TABS   meloxicam (MOBIC) 15 MG tablet   montelukast (SINGULAIR) 10 MG tablet   Multiple Vitamins-Minerals (CENTRUM SILVER PO)   Semaglutide, 1 MG/DOSE, (OZEMPIC, 1 MG/DOSE,) 2 MG/1.5ML SOPN   spironolactone (ALDACTONE) 25 MG tablet   telmisartan (MICARDIS) 40 MG tablet   No current facility-administered medications for this encounter.   Last ASA reported as 10/05/21.   Myra Gianotti, PA-C Surgical Short Stay/Anesthesiology [facility] Phone [phone] [facility] Phone [phone] 10/11/2021 5:18 PM

## 2021-10-11 NOTE — Anesthesia Preprocedure Evaluation (Addendum)
Anesthesia Evaluation  Patient identified by MRN, date of birth, ID band Patient awake    History of Anesthesia Complications (+) Family history of anesthesia reaction  Airway Mallampati: II       Dental no notable dental hx.    Pulmonary    Pulmonary exam normal        Cardiovascular hypertension, Pt. on medications Normal cardiovascular exam+ Valvular Problems/Murmurs AS   Echo 08/16/21 ([facility]): IMPRESSION: Technically difficult but adequate 2D M-mode and color flow Doppler echocardiogram demonstrates normal left ventricular chamber size and contractility. Ejection fraction and wall motion is normal.  2. The aortic valve is moderately thickened. Moderate aortic stenosis is noted with aortic valve area measuring between 1.3 to 1.7 cm. Peak and mean gradients of 19.000 and 13.000 mmHg. 3. The mitral valve is pliable trace mitral insufficiency is noted  4. The tricuspid valve is pliable trace tricuspid insufficiency is noted  5. The pericardium is of normal thickness there is no effusion.   Neuro/Psych    GI/Hepatic Neg liver ROS,   Endo/Other  diabetes, Well Controlled, Type 2, Oral Hypoglycemic AgentsHypothyroidism   Renal/GU negative Renal ROS     Musculoskeletal  (+) Arthritis , Osteoarthritis,    Abdominal (+) + obese,   Peds  Hematology   Anesthesia Other Findings   Reproductive/Obstetrics                           Anesthesia Physical Anesthesia Plan  ASA: 3  Anesthesia Plan: General   Post-op Pain Management: Dilaudid IV   Induction: Intravenous  PONV Risk Score and Plan: 3 and Ondansetron and Treatment may vary due to age or medical condition  Airway Management Planned: Oral ETT  Additional Equipment: None  Intra-op Plan:   Post-operative Plan: Extubation in OR  Informed Consent: I have reviewed the patients History and Physical, chart, labs and discussed the  procedure including the risks, benefits and alternatives for the proposed anesthesia with the patient or authorized representative who has indicated his/her understanding and acceptance.     Dental advisory given  Plan Discussed with: CRNA  Anesthesia Plan Comments: (PAT note written 10/11/2021 by Myra Gianotti, PA-C. )      Anesthesia Quick Evaluation

## 2021-10-13 ENCOUNTER — Inpatient Hospital Stay (HOSPITAL_COMMUNITY): Payer: Medicare Other | Admitting: Anesthesiology

## 2021-10-13 ENCOUNTER — Inpatient Hospital Stay (HOSPITAL_COMMUNITY): Payer: Medicare Other | Admitting: Vascular Surgery

## 2021-10-13 ENCOUNTER — Inpatient Hospital Stay (HOSPITAL_COMMUNITY): Payer: Medicare Other

## 2021-10-13 ENCOUNTER — Other Ambulatory Visit: Payer: Self-pay

## 2021-10-13 ENCOUNTER — Encounter (HOSPITAL_COMMUNITY): Payer: Self-pay | Admitting: Orthopedic Surgery

## 2021-10-13 ENCOUNTER — Inpatient Hospital Stay (HOSPITAL_COMMUNITY)
Admission: RE | Admit: 2021-10-13 | Discharge: 2021-10-14 | DRG: 455 | Disposition: A | Discharge status: Home / Self Care | Payer: Medicare Other | Attending: Orthopedic Surgery | Admitting: Orthopedic Surgery

## 2021-10-13 ENCOUNTER — Inpatient Hospital Stay (HOSPITAL_COMMUNITY)
Admission: RE | Disposition: A | Discharge status: Home / Self Care | Payer: Self-pay | Source: Home / Self Care | Attending: Orthopedic Surgery

## 2021-10-13 DIAGNOSIS — Z9049 Acquired absence of other specified parts of digestive tract: Secondary | ICD-10-CM | POA: Diagnosis not present

## 2021-10-13 DIAGNOSIS — Z981 Arthrodesis status: Secondary | ICD-10-CM | POA: Diagnosis not present

## 2021-10-13 DIAGNOSIS — M48061 Spinal stenosis, lumbar region without neurogenic claudication: Secondary | ICD-10-CM | POA: Diagnosis present

## 2021-10-13 DIAGNOSIS — I10 Essential (primary) hypertension: Secondary | ICD-10-CM | POA: Diagnosis present

## 2021-10-13 DIAGNOSIS — Z90710 Acquired absence of both cervix and uterus: Secondary | ICD-10-CM | POA: Diagnosis not present

## 2021-10-13 DIAGNOSIS — M5124 Other intervertebral disc displacement, thoracic region: Secondary | ICD-10-CM | POA: Diagnosis present

## 2021-10-13 DIAGNOSIS — Z6836 Body mass index (BMI) 36.0-36.9, adult: Secondary | ICD-10-CM

## 2021-10-13 DIAGNOSIS — Z86718 Personal history of other venous thrombosis and embolism: Secondary | ICD-10-CM

## 2021-10-13 DIAGNOSIS — E669 Obesity, unspecified: Secondary | ICD-10-CM | POA: Diagnosis present

## 2021-10-13 DIAGNOSIS — M5116 Intervertebral disc disorders with radiculopathy, lumbar region: Secondary | ICD-10-CM | POA: Diagnosis present

## 2021-10-13 DIAGNOSIS — Z793 Long term (current) use of hormonal contraceptives: Secondary | ICD-10-CM

## 2021-10-13 DIAGNOSIS — G4733 Obstructive sleep apnea (adult) (pediatric): Secondary | ICD-10-CM | POA: Diagnosis present

## 2021-10-13 DIAGNOSIS — Z79899 Other long term (current) drug therapy: Secondary | ICD-10-CM

## 2021-10-13 DIAGNOSIS — Z01818 Encounter for other preprocedural examination: Secondary | ICD-10-CM | POA: Diagnosis not present

## 2021-10-13 DIAGNOSIS — E1151 Type 2 diabetes mellitus with diabetic peripheral angiopathy without gangrene: Secondary | ICD-10-CM | POA: Diagnosis present

## 2021-10-13 DIAGNOSIS — M4804 Spinal stenosis, thoracic region: Secondary | ICD-10-CM | POA: Diagnosis present

## 2021-10-13 DIAGNOSIS — J4530 Mild persistent asthma, uncomplicated: Secondary | ICD-10-CM | POA: Diagnosis present

## 2021-10-13 DIAGNOSIS — Z7982 Long term (current) use of aspirin: Secondary | ICD-10-CM

## 2021-10-13 DIAGNOSIS — Z419 Encounter for procedure for purposes other than remedying health state, unspecified: Secondary | ICD-10-CM

## 2021-10-13 DIAGNOSIS — Z20822 Contact with and (suspected) exposure to covid-19: Secondary | ICD-10-CM | POA: Diagnosis present

## 2021-10-13 DIAGNOSIS — M5416 Radiculopathy, lumbar region: Secondary | ICD-10-CM | POA: Diagnosis present

## 2021-10-13 DIAGNOSIS — E890 Postprocedural hypothyroidism: Secondary | ICD-10-CM | POA: Diagnosis present

## 2021-10-13 DIAGNOSIS — E785 Hyperlipidemia, unspecified: Secondary | ICD-10-CM | POA: Diagnosis present

## 2021-10-13 DIAGNOSIS — M5410 Radiculopathy, site unspecified: Secondary | ICD-10-CM | POA: Diagnosis present

## 2021-10-13 HISTORY — PX: TRANSFORAMINAL LUMBAR INTERBODY FUSION (TLIF) WITH PEDICLE SCREW FIXATION 2 LEVEL: SHX6142

## 2021-10-13 LAB — GLUCOSE, CAPILLARY
Glucose-Capillary: 165 mg/dL — ABNORMAL HIGH (ref 70–99)
Glucose-Capillary: 237 mg/dL — ABNORMAL HIGH (ref 70–99)
Glucose-Capillary: 211 mg/dL — ABNORMAL HIGH (ref 70–99)
Glucose-Capillary: 278 mg/dL — ABNORMAL HIGH (ref 70–99)

## 2021-10-13 IMAGING — RF DG THORACOLUMBAR SPINE 2V
1 series · 2 of 2 positions shown · non-contrast
Comparison: None.

CLINICAL DATA: Back pain, lumbar spine surgery

EXAM:
THORACOLUMBAR SPINE 1V

[Series 1: run · 2 of 2 slices shown]
[im 1/2]
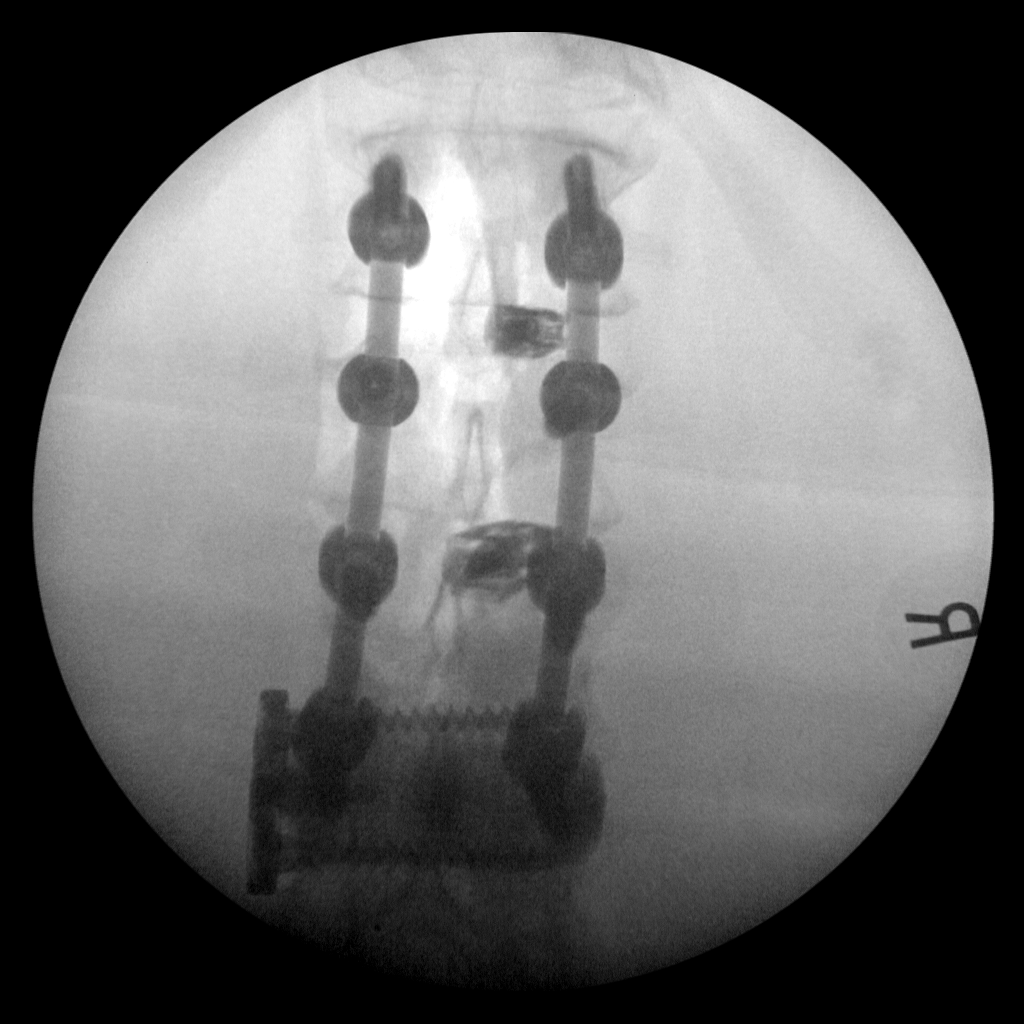
[im 2/2]
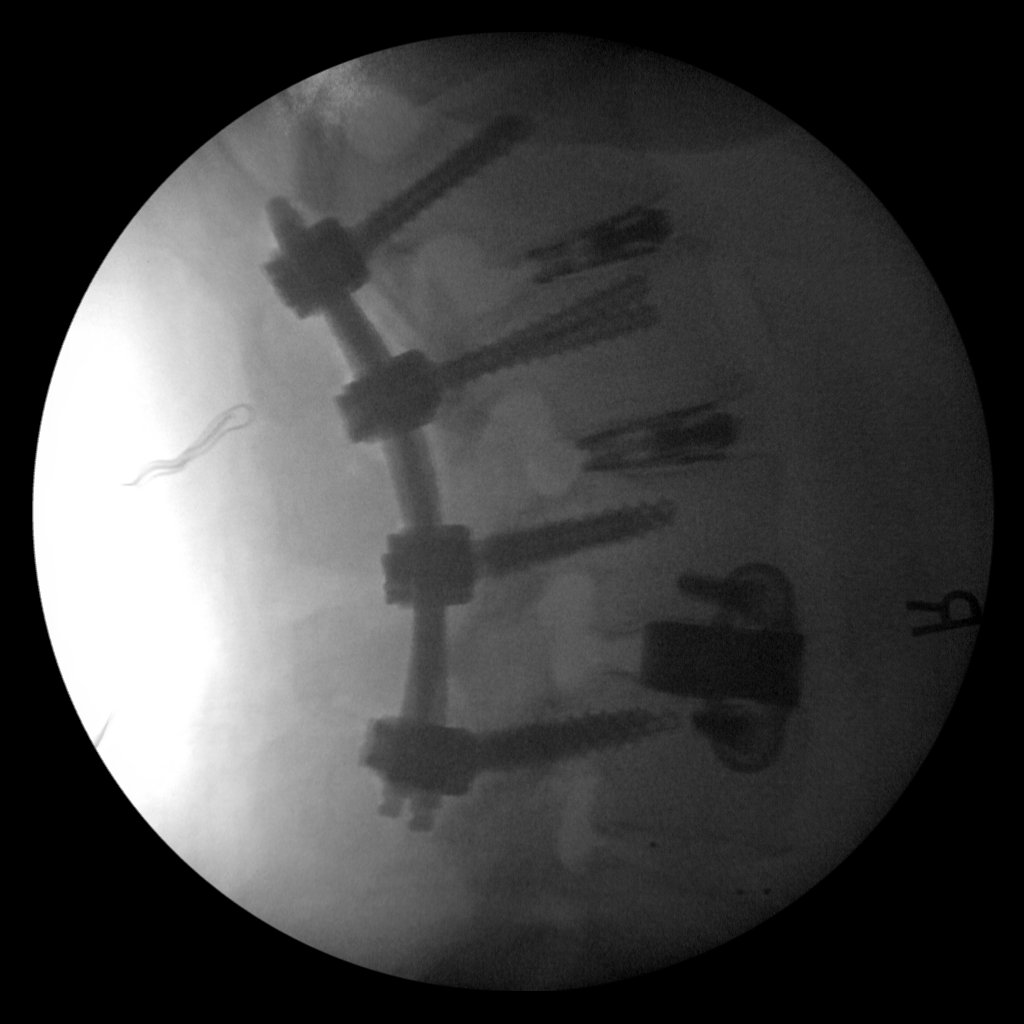

[2 of 2 positions shown; findings below may reference images not displayed]

FINDINGS: Fluoroscopic images show evidence of lumbar spinal fusion from
T12-L3 levels. Fluoroscopic time was 50.7 seconds. Radiation dose is
38.67 mGy.
IMPRESSION: Fluoroscopic assistance was provided for lumbar spinal fusion.

## 2021-10-13 IMAGING — CR DG LUMBAR SPINE 1V
1 series · 1 of 1 positions shown · non-contrast
Comparison: MR lumbar spine done on [DATE]

CLINICAL DATA: Cross-table lateral view was done for intraoperative
localization

EXAM:
LUMBAR SPINE - 1 VIEW

[lateral]
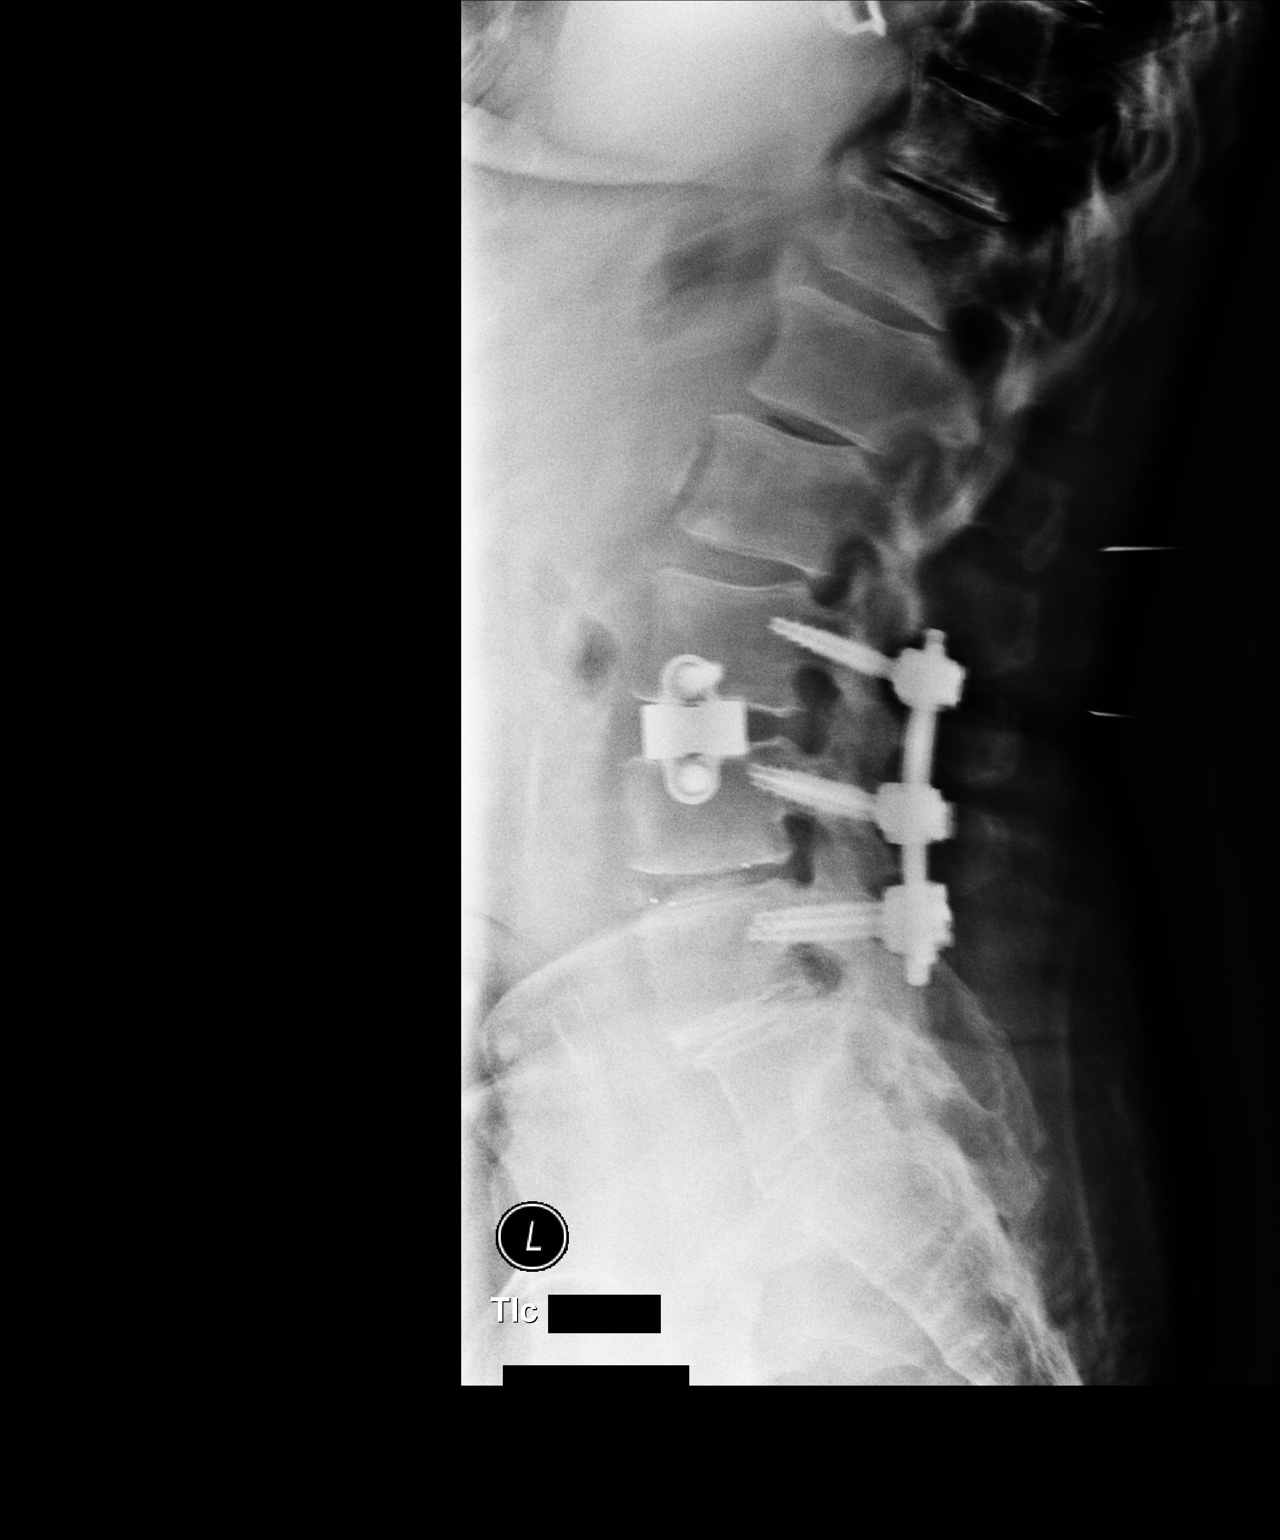

[1 of 1 positions shown; findings below may reference images not displayed]

FINDINGS: There is previous lumbar fusion at L2-L3 and L3-L4 levels.
Intervertebral disc spacer is seen at L2-L3 level. There is
narrowing of disc space along with bony spurs and facet hypertrophy
at multiple levels, more so at L4-L5 and L5-S1 levels.
IMPRESSION: Cross-table lateral view was performed for intraoperative
localization

## 2021-10-13 SURGERY — TRANSFORAMINAL LUMBAR INTERBODY FUSION (TLIF) WITH PEDICLE SCREW FIXATION 2 LEVEL
Anesthesia: General

## 2021-10-13 MED ORDER — FENTANYL CITRATE (PF) 250 MCG/5ML IJ SOLN
INTRAMUSCULAR | Status: AC
  Filled 2021-10-13: qty 5

## 2021-10-13 MED ORDER — INSULIN GLARGINE-YFGN 100 UNIT/ML ~~LOC~~ SOLN
50.0000 [IU] | Freq: Every day | SUBCUTANEOUS | Status: DC
  Administered 2021-10-13: 50 [IU] via SUBCUTANEOUS
  Filled 2021-10-13 (×2): qty 0.5

## 2021-10-13 MED ORDER — ALBUTEROL SULFATE (2.5 MG/3ML) 0.083% IN NEBU: 2.5000 mg | INHALATION_SOLUTION | Freq: Four times a day (QID) | RESPIRATORY_TRACT | Status: DC | PRN

## 2021-10-13 MED ORDER — FOLIC ACID 800 MCG PO TABS: 400.0000 ug | ORAL_TABLET | Freq: Every day | ORAL | Status: DC

## 2021-10-13 MED ORDER — ZOLPIDEM TARTRATE 5 MG PO TABS: 5.0000 mg | ORAL_TABLET | Freq: Every evening | ORAL | Status: DC | PRN

## 2021-10-13 MED ORDER — ONDANSETRON HCL 4 MG/2ML IJ SOLN
INTRAMUSCULAR | Status: AC
  Filled 2021-10-13: qty 2

## 2021-10-13 MED ORDER — ACETAMINOPHEN 650 MG RE SUPP: 650.0000 mg | RECTAL | Status: DC | PRN

## 2021-10-13 MED ORDER — POTASSIUM CHLORIDE IN NACL 20-0.9 MEQ/L-% IV SOLN: INTRAVENOUS | Status: DC

## 2021-10-13 MED ORDER — ASCORBIC ACID 500 MG PO TABS
1000.0000 mg | ORAL_TABLET | Freq: Two times a day (BID) | ORAL | Status: DC
  Administered 2021-10-13 – 2021-10-14 (×2): 1000 mg via ORAL
  Filled 2021-10-13 (×2): qty 2

## 2021-10-13 MED ORDER — ACETAMINOPHEN 325 MG PO TABS: 650.0000 mg | ORAL_TABLET | ORAL | Status: DC | PRN

## 2021-10-13 MED ORDER — LACTATED RINGERS IV SOLN: INTRAVENOUS | Status: DC

## 2021-10-13 MED ORDER — PROPOFOL 10 MG/ML IV BOLUS
INTRAVENOUS | Status: DC | PRN
  Administered 2021-10-13 (×2): 100 mg via INTRAVENOUS

## 2021-10-13 MED ORDER — DEXMEDETOMIDINE (PRECEDEX) IN NS 20 MCG/5ML (4 MCG/ML) IV SYRINGE
PREFILLED_SYRINGE | INTRAVENOUS | Status: DC | PRN
  Administered 2021-10-13: 4 ug via INTRAVENOUS
  Administered 2021-10-13: 8 ug via INTRAVENOUS

## 2021-10-13 MED ORDER — ONDANSETRON HCL 4 MG PO TABS: 4.0000 mg | ORAL_TABLET | Freq: Four times a day (QID) | ORAL | Status: DC | PRN

## 2021-10-13 MED ORDER — ATORVASTATIN CALCIUM 40 MG PO TABS
40.0000 mg | ORAL_TABLET | Freq: Every day | ORAL | Status: DC
  Administered 2021-10-13: 40 mg via ORAL
  Filled 2021-10-13: qty 1

## 2021-10-13 MED ORDER — AMLODIPINE BESYLATE 5 MG PO TABS
5.0000 mg | ORAL_TABLET | Freq: Every day | ORAL | Status: DC
  Administered 2021-10-14: 5 mg via ORAL
  Filled 2021-10-13: qty 1

## 2021-10-13 MED ORDER — GLIMEPIRIDE 4 MG PO TABS
4.0000 mg | ORAL_TABLET | Freq: Every day | ORAL | Status: DC
  Administered 2021-10-14: 4 mg via ORAL
  Filled 2021-10-13: qty 1

## 2021-10-13 MED ORDER — ACETAMINOPHEN 10 MG/ML IV SOLN
1000.0000 mg | Freq: Once | INTRAVENOUS | Status: DC | PRN
  Administered 2021-10-13: 1000 mg via INTRAVENOUS

## 2021-10-13 MED ORDER — FLEET ENEMA 7-19 GM/118ML RE ENEM: 1.0000 | ENEMA | Freq: Once | RECTAL | Status: DC | PRN

## 2021-10-13 MED ORDER — SODIUM CHLORIDE 0.9% FLUSH: 3.0000 mL | INTRAVENOUS | Status: DC | PRN

## 2021-10-13 MED ORDER — LEVOTHYROXINE SODIUM 125 MCG PO TABS
125.0000 ug | ORAL_TABLET | Freq: Every day | ORAL | Status: DC
  Administered 2021-10-14: 125 ug via ORAL
  Filled 2021-10-13: qty 1

## 2021-10-13 MED ORDER — FLUTICASONE PROPIONATE 50 MCG/ACT NA SUSP
2.0000 | Freq: Every day | NASAL | Status: DC
  Administered 2021-10-13: 2 via NASAL
  Filled 2021-10-13: qty 16

## 2021-10-13 MED ORDER — HYDROCODONE-ACETAMINOPHEN 5-325 MG PO TABS: 1.0000 | ORAL_TABLET | ORAL | Status: DC | PRN

## 2021-10-13 MED ORDER — EPHEDRINE 5 MG/ML INJ
INTRAVENOUS | Status: AC
  Filled 2021-10-13: qty 5

## 2021-10-13 MED ORDER — IRBESARTAN 150 MG PO TABS
150.0000 mg | ORAL_TABLET | Freq: Every day | ORAL | Status: DC
  Administered 2021-10-13 – 2021-10-14 (×2): 150 mg via ORAL
  Filled 2021-10-13 (×2): qty 1

## 2021-10-13 MED ORDER — PHENYLEPHRINE 40 MCG/ML (10ML) SYRINGE FOR IV PUSH (FOR BLOOD PRESSURE SUPPORT)
PREFILLED_SYRINGE | INTRAVENOUS | Status: AC
  Filled 2021-10-13: qty 20

## 2021-10-13 MED ORDER — ONDANSETRON HCL 4 MG/2ML IJ SOLN: 4.0000 mg | Freq: Four times a day (QID) | INTRAMUSCULAR | Status: DC | PRN

## 2021-10-13 MED ORDER — ONDANSETRON HCL 4 MG/2ML IJ SOLN
INTRAMUSCULAR | Status: DC | PRN
Start: 2021-10-13 — End: 2021-10-13
  Administered 2021-10-13: 4 mg via INTRAVENOUS

## 2021-10-13 MED ORDER — ROCURONIUM BROMIDE 10 MG/ML (PF) SYRINGE
PREFILLED_SYRINGE | INTRAVENOUS | Status: AC
  Filled 2021-10-13: qty 20

## 2021-10-13 MED ORDER — THROMBIN 20000 UNITS EX SOLR
CUTANEOUS | Status: DC | PRN
  Administered 2021-10-13: 20000 [IU] via TOPICAL

## 2021-10-13 MED ORDER — DAPAGLIFLOZIN PROPANEDIOL 10 MG PO TABS
10.0000 mg | ORAL_TABLET | Freq: Every day | ORAL | Status: DC
  Administered 2021-10-14: 10 mg via ORAL
  Filled 2021-10-13: qty 1

## 2021-10-13 MED ORDER — ROCURONIUM BROMIDE 10 MG/ML (PF) SYRINGE
PREFILLED_SYRINGE | INTRAVENOUS | Status: DC | PRN
Start: 2021-10-13 — End: 2021-10-13
  Administered 2021-10-13: 50 mg via INTRAVENOUS
  Administered 2021-10-13: 20 mg via INTRAVENOUS
  Administered 2021-10-13: 50 mg via INTRAVENOUS
  Administered 2021-10-13: 20 mg via INTRAVENOUS
  Administered 2021-10-13: 80 mg via INTRAVENOUS

## 2021-10-13 MED ORDER — OXYCODONE-ACETAMINOPHEN 5-325 MG PO TABS: 1.0000 | ORAL_TABLET | ORAL | Status: DC | PRN

## 2021-10-13 MED ORDER — ALBUMIN HUMAN 5 % IV SOLN: INTRAVENOUS | Status: DC | PRN

## 2021-10-13 MED ORDER — FENTANYL CITRATE (PF) 100 MCG/2ML IJ SOLN
INTRAMUSCULAR | Status: AC
  Filled 2021-10-13: qty 2

## 2021-10-13 MED ORDER — OYSTER SHELL CALCIUM/D3 500-5 MG-MCG PO TABS
1.0000 | ORAL_TABLET | Freq: Two times a day (BID) | ORAL | Status: DC
  Administered 2021-10-13 – 2021-10-14 (×2): 1 via ORAL
  Filled 2021-10-13 (×2): qty 1

## 2021-10-13 MED ORDER — FERROUS SULFATE 325 (65 FE) MG PO TABS
325.0000 mg | ORAL_TABLET | ORAL | Status: DC
  Administered 2021-10-13: 325 mg via ORAL
  Filled 2021-10-13: qty 1

## 2021-10-13 MED ORDER — ALUM & MAG HYDROXIDE-SIMETH 200-200-20 MG/5ML PO SUSP: 30.0000 mL | Freq: Four times a day (QID) | ORAL | Status: DC | PRN

## 2021-10-13 MED ORDER — MELATONIN 5 MG PO TABS
10.0000 mg | ORAL_TABLET | Freq: Every day | ORAL | Status: DC
  Administered 2021-10-13: 10 mg via ORAL
  Filled 2021-10-13: qty 2

## 2021-10-13 MED ORDER — BISACODYL 5 MG PO TBEC: 5.0000 mg | DELAYED_RELEASE_TABLET | Freq: Every day | ORAL | Status: DC | PRN

## 2021-10-13 MED ORDER — FOLIC ACID 1 MG PO TABS
1.0000 mg | ORAL_TABLET | Freq: Every day | ORAL | Status: DC
  Administered 2021-10-13: 1 mg via ORAL
  Filled 2021-10-13: qty 1

## 2021-10-13 MED ORDER — POVIDONE-IODINE 7.5 % EX SOLN: Freq: Once | CUTANEOUS | Status: DC

## 2021-10-13 MED ORDER — SEMAGLUTIDE (1 MG/DOSE) 2 MG/1.5ML ~~LOC~~ SOPN: 1.0000 mg | PEN_INJECTOR | SUBCUTANEOUS | Status: DC

## 2021-10-13 MED ORDER — PHENOL 1.4 % MT LIQD
1.0000 | OROMUCOSAL | Status: DC | PRN
  Filled 2021-10-13: qty 177

## 2021-10-13 MED ORDER — MIDAZOLAM HCL 2 MG/2ML IJ SOLN
INTRAMUSCULAR | Status: DC | PRN
  Administered 2021-10-13 (×2): 1 mg via INTRAVENOUS

## 2021-10-13 MED ORDER — SENNOSIDES-DOCUSATE SODIUM 8.6-50 MG PO TABS: 1.0000 | ORAL_TABLET | Freq: Every evening | ORAL | Status: DC | PRN

## 2021-10-13 MED ORDER — BUPIVACAINE-EPINEPHRINE 0.25% -1:200000 IJ SOLN
INTRAMUSCULAR | Status: DC | PRN
  Administered 2021-10-13: 20 mL
  Administered 2021-10-13: 10 mL

## 2021-10-13 MED ORDER — ORAL CARE MOUTH RINSE: 15.0000 mL | Freq: Once | OROMUCOSAL | Status: AC

## 2021-10-13 MED ORDER — MIDAZOLAM HCL 2 MG/2ML IJ SOLN
INTRAMUSCULAR | Status: AC
  Filled 2021-10-13: qty 2

## 2021-10-13 MED ORDER — MORPHINE SULFATE (PF) 2 MG/ML IV SOLN: 1.0000 mg | INTRAVENOUS | Status: DC | PRN

## 2021-10-13 MED ORDER — PHENYLEPHRINE 40 MCG/ML (10ML) SYRINGE FOR IV PUSH (FOR BLOOD PRESSURE SUPPORT)
PREFILLED_SYRINGE | INTRAVENOUS | Status: DC | PRN
  Administered 2021-10-13 (×3): 80 ug via INTRAVENOUS
  Administered 2021-10-13 (×2): 120 ug via INTRAVENOUS

## 2021-10-13 MED ORDER — DAPAGLIFLOZIN PRO-METFORMIN ER 5-1000 MG PO TB24: 2.0000 | ORAL_TABLET | Freq: Every day | ORAL | Status: DC

## 2021-10-13 MED ORDER — VANCOMYCIN HCL IN DEXTROSE 1-5 GM/200ML-% IV SOLN
1000.0000 mg | Freq: Once | INTRAVENOUS | Status: AC
  Administered 2021-10-13: 1000 mg via INTRAVENOUS
  Filled 2021-10-13: qty 200

## 2021-10-13 MED ORDER — PHENYLEPHRINE HCL-NACL 20-0.9 MG/250ML-% IV SOLN
INTRAVENOUS | Status: DC | PRN
  Administered 2021-10-13: 100 ug/min via INTRAVENOUS

## 2021-10-13 MED ORDER — CYCLOBENZAPRINE HCL 5 MG PO TABS
5.0000 mg | ORAL_TABLET | Freq: Every evening | ORAL | Status: DC | PRN
  Administered 2021-10-13: 5 mg via ORAL
  Filled 2021-10-13: qty 1

## 2021-10-13 MED ORDER — SODIUM CHLORIDE 0.9% FLUSH: 3.0000 mL | Freq: Two times a day (BID) | INTRAVENOUS | Status: DC

## 2021-10-13 MED ORDER — SUGAMMADEX SODIUM 200 MG/2ML IV SOLN
INTRAVENOUS | Status: DC | PRN
  Administered 2021-10-13: 200 mg via INTRAVENOUS
  Administered 2021-10-13: 100 mg via INTRAVENOUS

## 2021-10-13 MED ORDER — SPIRONOLACTONE 25 MG PO TABS
25.0000 mg | ORAL_TABLET | Freq: Every day | ORAL | Status: DC
  Administered 2021-10-13 – 2021-10-14 (×2): 25 mg via ORAL
  Filled 2021-10-13 (×2): qty 1

## 2021-10-13 MED ORDER — BUPIVACAINE-EPINEPHRINE (PF) 0.25% -1:200000 IJ SOLN
INTRAMUSCULAR | Status: AC
  Filled 2021-10-13: qty 30

## 2021-10-13 MED ORDER — FENTANYL CITRATE (PF) 250 MCG/5ML IJ SOLN
INTRAMUSCULAR | Status: DC | PRN
  Administered 2021-10-13 (×2): 50 ug via INTRAVENOUS
  Administered 2021-10-13: 100 ug via INTRAVENOUS
  Administered 2021-10-13: 50 ug via INTRAVENOUS

## 2021-10-13 MED ORDER — SURGIFLO WITH THROMBIN (HEMOSTATIC MATRIX KIT) OPTIME
TOPICAL | Status: DC | PRN
  Administered 2021-10-13: 1

## 2021-10-13 MED ORDER — MENTHOL 3 MG MT LOZG: 1.0000 | LOZENGE | OROMUCOSAL | Status: DC | PRN

## 2021-10-13 MED ORDER — ONDANSETRON HCL 4 MG/2ML IJ SOLN
4.0000 mg | Freq: Once | INTRAMUSCULAR | Status: AC | PRN
  Administered 2021-10-13: 4 mg via INTRAVENOUS

## 2021-10-13 MED ORDER — MONTELUKAST SODIUM 10 MG PO TABS
10.0000 mg | ORAL_TABLET | Freq: Every day | ORAL | Status: DC
  Administered 2021-10-13: 10 mg via ORAL
  Filled 2021-10-13: qty 1

## 2021-10-13 MED ORDER — PROPOFOL 10 MG/ML IV BOLUS
INTRAVENOUS | Status: AC
  Filled 2021-10-13: qty 20

## 2021-10-13 MED ORDER — METHOCARBAMOL 500 MG PO TABS
500.0000 mg | ORAL_TABLET | Freq: Four times a day (QID) | ORAL | Status: DC | PRN
  Administered 2021-10-13 – 2021-10-14 (×2): 500 mg via ORAL
  Filled 2021-10-13 (×2): qty 1

## 2021-10-13 MED ORDER — MOMETASONE FURO-FORMOTEROL FUM 200-5 MCG/ACT IN AERO
2.0000 | INHALATION_SPRAY | Freq: Two times a day (BID) | RESPIRATORY_TRACT | Status: DC
  Administered 2021-10-13 – 2021-10-14 (×2): 2 via RESPIRATORY_TRACT
  Filled 2021-10-13: qty 8.8

## 2021-10-13 MED ORDER — BUPIVACAINE LIPOSOME 1.3 % IJ SUSP
INTRAMUSCULAR | Status: DC | PRN
  Administered 2021-10-13: 20 mL

## 2021-10-13 MED ORDER — VANCOMYCIN HCL IN DEXTROSE 1-5 GM/200ML-% IV SOLN
1000.0000 mg | INTRAVENOUS | Status: AC
  Administered 2021-10-13: 1000 mg via INTRAVENOUS
  Filled 2021-10-13: qty 200

## 2021-10-13 MED ORDER — INSULIN ASPART 100 UNIT/ML IJ SOLN
0.0000 [IU] | Freq: Three times a day (TID) | INTRAMUSCULAR | Status: DC
  Administered 2021-10-13: 5 [IU] via SUBCUTANEOUS
  Administered 2021-10-14: 3 [IU] via SUBCUTANEOUS

## 2021-10-13 MED ORDER — HYDROCODONE-ACETAMINOPHEN 5-325 MG PO TABS
1.0000 | ORAL_TABLET | ORAL | Status: DC | PRN
  Administered 2021-10-13 – 2021-10-14 (×5): 2 via ORAL
  Filled 2021-10-13 (×5): qty 2

## 2021-10-13 MED ORDER — 0.9 % SODIUM CHLORIDE (POUR BTL) OPTIME
TOPICAL | Status: DC | PRN
  Administered 2021-10-13: 2000 mL

## 2021-10-13 MED ORDER — BUPIVACAINE LIPOSOME 1.3 % IJ SUSP
INTRAMUSCULAR | Status: AC
  Filled 2021-10-13: qty 20

## 2021-10-13 MED ORDER — FUROSEMIDE 20 MG PO TABS: 20.0000 mg | ORAL_TABLET | Freq: Every day | ORAL | Status: DC | PRN

## 2021-10-13 MED ORDER — METFORMIN HCL ER 500 MG PO TB24
2000.0000 mg | ORAL_TABLET | Freq: Every day | ORAL | Status: DC
  Administered 2021-10-14: 2000 mg via ORAL
  Filled 2021-10-13: qty 4

## 2021-10-13 MED ORDER — SODIUM CHLORIDE 0.9 % IV SOLN
250.0000 mL | INTRAVENOUS | Status: DC
  Administered 2021-10-13: 250 mL via INTRAVENOUS

## 2021-10-13 MED ORDER — EPHEDRINE SULFATE-NACL 50-0.9 MG/10ML-% IV SOSY
PREFILLED_SYRINGE | INTRAVENOUS | Status: DC | PRN
  Administered 2021-10-13 (×2): 5 mg via INTRAVENOUS
  Administered 2021-10-13: 10 mg via INTRAVENOUS

## 2021-10-13 MED ORDER — METHOCARBAMOL 1000 MG/10ML IJ SOLN
500.0000 mg | Freq: Four times a day (QID) | INTRAVENOUS | Status: DC | PRN
  Filled 2021-10-13: qty 5

## 2021-10-13 MED ORDER — FENTANYL CITRATE (PF) 100 MCG/2ML IJ SOLN
25.0000 ug | INTRAMUSCULAR | Status: DC | PRN
  Administered 2021-10-13: 25 ug via INTRAVENOUS
  Administered 2021-10-13: 50 ug via INTRAVENOUS
  Administered 2021-10-13: 25 ug via INTRAVENOUS
  Administered 2021-10-13: 50 ug via INTRAVENOUS

## 2021-10-13 MED ORDER — THROMBIN (RECOMBINANT) 20000 UNITS EX SOLR
CUTANEOUS | Status: AC
  Filled 2021-10-13: qty 20000

## 2021-10-13 MED ORDER — METHYLENE BLUE 0.5 % INJ SOLN
INTRAVENOUS | Status: AC
  Filled 2021-10-13: qty 10

## 2021-10-13 MED ORDER — HEMOSTATIC AGENTS (NO CHARGE) OPTIME
TOPICAL | Status: DC | PRN
  Administered 2021-10-13: 1

## 2021-10-13 MED ORDER — ACETAMINOPHEN 10 MG/ML IV SOLN
INTRAVENOUS | Status: AC
  Filled 2021-10-13: qty 100

## 2021-10-13 MED ORDER — LIDOCAINE 2% (20 MG/ML) 5 ML SYRINGE
INTRAMUSCULAR | Status: DC | PRN
  Administered 2021-10-13: 100 mg via INTRAVENOUS

## 2021-10-13 MED ORDER — FENOFIBRATE 160 MG PO TABS
160.0000 mg | ORAL_TABLET | Freq: Every day | ORAL | Status: DC
  Administered 2021-10-13 – 2021-10-14 (×2): 160 mg via ORAL
  Filled 2021-10-13 (×2): qty 1

## 2021-10-13 MED ORDER — DOCUSATE SODIUM 100 MG PO CAPS
100.0000 mg | ORAL_CAPSULE | Freq: Two times a day (BID) | ORAL | Status: DC
  Administered 2021-10-13 – 2021-10-14 (×2): 100 mg via ORAL
  Filled 2021-10-13 (×2): qty 1

## 2021-10-13 MED ORDER — CHLORHEXIDINE GLUCONATE 0.12 % MT SOLN
15.0000 mL | Freq: Once | OROMUCOSAL | Status: AC
  Administered 2021-10-13: 15 mL via OROMUCOSAL
  Filled 2021-10-13: qty 15

## 2021-10-13 MED ORDER — LIDOCAINE 2% (20 MG/ML) 5 ML SYRINGE
INTRAMUSCULAR | Status: AC
  Filled 2021-10-13: qty 5

## 2021-10-13 SURGICAL SUPPLY — 92 items
BAG COUNTER SPONGE SURGICOUNT (BAG) ×2 IMPLANT
BENZOIN TINCTURE PRP APPL 2/3 (GAUZE/BANDAGES/DRESSINGS) ×2 IMPLANT
BLADE CLIPPER SURG (BLADE) IMPLANT
BONE VIVIGEN FORMABLE 1.3CC (Bone Implant) ×2 IMPLANT
BUR PRESCISION 1.7 ELITE (BURR) ×2 IMPLANT
BUR ROUND FLUTED 5 RND (BURR) ×1 IMPLANT
BUR ROUND PRECISION 4.0 (BURR) IMPLANT
BUR SABER RD CUTTING 3.0 (BURR) IMPLANT
CAGE SABLE 10X26 6-12 8D (Cage) ×1 IMPLANT
CAGE SABLE 8X22 6-12 8D (Cage) ×1 IMPLANT
CANNULA GRAFT BNE VG PRE-FILL (Bone Implant) IMPLANT
CARTRIDGE OIL MAESTRO DRILL (MISCELLANEOUS) ×1 IMPLANT
CNTNR URN SCR LID CUP LEK RST (MISCELLANEOUS) ×1 IMPLANT
CONT SPEC 4OZ STRL OR WHT (MISCELLANEOUS) ×2
COVER MAYO STAND STRL (DRAPES) ×4 IMPLANT
COVER SURGICAL LIGHT HANDLE (MISCELLANEOUS) ×2 IMPLANT
DIFFUSER DRILL AIR PNEUMATIC (MISCELLANEOUS) ×2 IMPLANT
DISPENSER GRAFT BNE VG (MISCELLANEOUS) IMPLANT
DISPENSER VIVIGEN BONE GRAFT (MISCELLANEOUS) ×2 IMPLANT
DRAIN CHANNEL 15F RND FF W/TCR (WOUND CARE) IMPLANT
DRAPE C-ARM 42X72 X-RAY (DRAPES) ×2 IMPLANT
DRAPE C-ARMOR (DRAPES) IMPLANT
DRAPE POUCH INSTRU U-SHP 10X18 (DRAPES) ×2 IMPLANT
DRAPE SURG 17X23 STRL (DRAPES) ×8 IMPLANT
DURAPREP 26ML APPLICATOR (WOUND CARE) ×2 IMPLANT
ELECT BLADE 4.0 EZ CLEAN MEGAD (MISCELLANEOUS) ×2
ELECT CAUTERY BLADE 6.4 (BLADE) ×2 IMPLANT
ELECT REM PT RETURN 9FT ADLT (ELECTROSURGICAL) ×2
ELECTRODE BLDE 4.0 EZ CLN MEGD (MISCELLANEOUS) ×1 IMPLANT
ELECTRODE REM PT RTRN 9FT ADLT (ELECTROSURGICAL) ×1 IMPLANT
EVACUATOR SILICONE 100CC (DRAIN) ×1 IMPLANT
FILTER STRAW FLUID ASPIR (MISCELLANEOUS) ×2 IMPLANT
GAUZE 4X4 16PLY ~~LOC~~+RFID DBL (SPONGE) ×2 IMPLANT
GAUZE SPONGE 4X4 12PLY STRL (GAUZE/BANDAGES/DRESSINGS) ×2 IMPLANT
GLOVE SRG 8 PF TXTR STRL LF DI (GLOVE) ×1 IMPLANT
GLOVE SURG ENC MOIS LTX SZ6.5 (GLOVE) ×2 IMPLANT
GLOVE SURG ENC MOIS LTX SZ8 (GLOVE) ×2 IMPLANT
GLOVE SURG UNDER POLY LF SZ7 (GLOVE) ×2 IMPLANT
GLOVE SURG UNDER POLY LF SZ8 (GLOVE) ×2
GOWN STRL REUS W/ TWL LRG LVL3 (GOWN DISPOSABLE) ×2 IMPLANT
GOWN STRL REUS W/ TWL XL LVL3 (GOWN DISPOSABLE) ×1 IMPLANT
GOWN STRL REUS W/TWL LRG LVL3 (GOWN DISPOSABLE) ×4
GOWN STRL REUS W/TWL XL LVL3 (GOWN DISPOSABLE) ×2
GRAFT BNE MATRIX VG FRMBL SM 1 (Bone Implant) IMPLANT
GRAFT BONE CANNULA VIVIGEN 3 (Bone Implant) ×6 IMPLANT
IV CATH 14GX2 1/4 (CATHETERS) ×2 IMPLANT
KIT BASIN OR (CUSTOM PROCEDURE TRAY) ×2 IMPLANT
KIT POSITION SURG JACKSON T1 (MISCELLANEOUS) ×2 IMPLANT
KIT TURNOVER KIT B (KITS) ×2 IMPLANT
MARKER SKIN DUAL TIP RULER LAB (MISCELLANEOUS) ×4 IMPLANT
NDL 18GX1X1/2 (RX/OR ONLY) (NEEDLE) ×1 IMPLANT
NDL HYPO 25GX1X1/2 BEV (NEEDLE) ×1 IMPLANT
NDL SPNL 18GX3.5 QUINCKE PK (NEEDLE) ×2 IMPLANT
NEEDLE 18GX1X1/2 (RX/OR ONLY) (NEEDLE) ×2 IMPLANT
NEEDLE 22X1 1/2 (OR ONLY) (NEEDLE) ×4 IMPLANT
NEEDLE HYPO 25GX1X1/2 BEV (NEEDLE) ×2 IMPLANT
NEEDLE SPNL 18GX3.5 QUINCKE PK (NEEDLE) ×4 IMPLANT
NS IRRIG 1000ML POUR BTL (IV SOLUTION) ×2 IMPLANT
OIL CARTRIDGE MAESTRO DRILL (MISCELLANEOUS) ×2
PACK LAMINECTOMY ORTHO (CUSTOM PROCEDURE TRAY) ×2 IMPLANT
PACK UNIVERSAL I (CUSTOM PROCEDURE TRAY) ×2 IMPLANT
PAD ARMBOARD 7.5X6 YLW CONV (MISCELLANEOUS) ×4 IMPLANT
PATTIES SURGICAL .5 X1 (DISPOSABLE) ×1 IMPLANT
PATTIES SURGICAL .5X1.5 (GAUZE/BANDAGES/DRESSINGS) ×3 IMPLANT
ROD VIPER PREBENT 100MM (Rod) ×2 IMPLANT
SCREW CANN PA THRD VIPER 6X35 (Screw) ×2 IMPLANT
SCREW CORTICAL VIPER 7X35 (Screw) ×4 IMPLANT
SCREW POLY CANN 6X40MM TI (Screw) ×2 IMPLANT
SCREW SET SINGLE INNER (Screw) ×8 IMPLANT
SPONGE INTESTINAL PEANUT (DISPOSABLE) ×4 IMPLANT
SPONGE SURGIFOAM ABS GEL 100 (HEMOSTASIS) ×2 IMPLANT
STRIP CLOSURE SKIN 1/2X4 (GAUZE/BANDAGES/DRESSINGS) ×4 IMPLANT
SURGIFLO W/THROMBIN 8M KIT (HEMOSTASIS) ×1 IMPLANT
SUT BONE WAX W31G (SUTURE) ×1 IMPLANT
SUT MNCRL AB 4-0 PS2 18 (SUTURE) ×2 IMPLANT
SUT VIC AB 0 CT1 18XCR BRD 8 (SUTURE) ×1 IMPLANT
SUT VIC AB 0 CT1 27 (SUTURE) ×2
SUT VIC AB 0 CT1 27XBRD ANBCTR (SUTURE) IMPLANT
SUT VIC AB 0 CT1 8-18 (SUTURE) ×4
SUT VIC AB 1 CT1 18XCR BRD 8 (SUTURE) ×1 IMPLANT
SUT VIC AB 1 CT1 8-18 (SUTURE) ×4
SUT VIC AB 2-0 CT2 18 VCP726D (SUTURE) ×3 IMPLANT
SYR 20ML LL LF (SYRINGE) ×4 IMPLANT
SYR BULB IRRIG 60ML STRL (SYRINGE) ×2 IMPLANT
SYR CONTROL 10ML LL (SYRINGE) ×4 IMPLANT
SYR TB 1ML LUER SLIP (SYRINGE) ×2 IMPLANT
TAP EXPEDIUM DL 5.0 (INSTRUMENTS) ×1 IMPLANT
TAP EXPEDIUM DL 6.0 (INSTRUMENTS) ×1 IMPLANT
TAP EXPEDIUM DL 7.0 (INSTRUMENTS) ×2
TAP EXPEDIUM DL 7X2 (INSTRUMENTS) IMPLANT
TRAY FOLEY MTR SLVR 16FR STAT (SET/KITS/TRAYS/PACK) ×2 IMPLANT
YANKAUER SUCT BULB TIP NO VENT (SUCTIONS) ×2 IMPLANT

## 2021-10-13 NOTE — Op Note (Addendum)
PATIENT NAME: Cheryl Finley   MEDICAL RECORD NO.:   [id number]    DATE OF BIRTH: [birth year]    DATE OF PROCEDURE: 10/13/2021                                 OPERATIVE REPORT     PREOPERATIVE DIAGNOSES: 1. Spinal stenosis T12/L1, L1/2 2. Status post previous fusion spanning L2-L4, with instrumentation 3.  Large right-sided T12/L1 foraminal disc herniation 4. Lumbar radiculopathy   POSTOPERATIVE DIAGNOSES: 1. Spinal stenosis T12/L1, L1/2 2. Status post previous fusion spanning L2-L4, with instrumentation 3.  Large right-sided T12/L1 foraminal disc herniation 4. Lumbar radiculopathy   PROCEDURES: 1. Lumbar decompression, T12/L1, L1/2, including bilateral partial facetectomy, and bilateral lumbar decompression 2. Right-sided T12/L1, L1/2 transforaminal lumbar interbody fusion. 3. Left-sided T12/L1, L1/2 posterolateral fusion. 4. Insertion of interbody device x 2 (Globus expandable intervertebral spacers). 5. Placement of segmental posterior instrumentation T12, L1, bilaterally. 6.  Removal of previous instrumentation bilaterally at L4 7.  Removal and reinsertion of previously placed instrumentation bilaterally at L2 and L3 8.  Use of local autograft. 9.  Use of morselized allograft - [employer]. 10.  Exploration of fusion, L2-3, L3-4 11.  Intraoperative use of fluoroscopy.   SURGEON:  Phylliss Bob, MD.   ASSISTANTPricilla Holm, PA-C.   ANESTHESIA:  General endotracheal anesthesia.   COMPLICATIONS:  None.   DISPOSITION:  Stable.   ESTIMATED BLOOD LOSS:  200cc   INDICATIONS FOR SURGERY:  Briefly, Cheryl Finley is a pleasant 76 y.o. -year-old female who did present to me with severe and ongoing pain in the right leg.  She was noted to have a large foraminal disc herniation on the right at T12/L1.  In addition, moderate to severe stenosis was noted at L1/L2.  Her pain was debilitating, and after failure of nonoperative measures, we did discuss surgical options.  I did discuss  proceeding with a decompression at both T12/L1, and L1-2.  As this was immediately above the fusion, I did also discuss proceeding with a fusion at these levels.  I did initially discuss proceeding with a posterior lateral fusion at T12/L1, as well as a transforaminal fusion at L1/L2.  Initially, I did not wish to perform a transforaminal fusion at T12/L1, given the fact that the spinal cord was at this level, and that no retraction will be possible.  However, intraoperatively, I did feel that I had plenty of space lateral to the spinal canal, which did allow me the ability to in fact proceed with a transforaminal lumbar interbody fusion at both levels.  This is ultimately what did occur, as I did feel this would lead to the most ideal outcomes for the patient.  The patient did wish to proceed with surgery.   OPERATIVE DETAILS:  On 10/13/2021, the patient was brought to surgery and general endotracheal anesthesia was administered.  The patient was placed prone on a well-padded flat Jackson bed with a spinal frame.  Antibiotics were given and a time-out procedure was performed. The back was prepped and draped in the usual fashion.  A midline incision was made spanning T12-L4.  The fascia was incised at the midline.  The paraspinal musculature was bluntly swept laterally.  At this point, the previously placed hardware spanning L2-L4 was noted.  The caps were removed as were the interconnecting rods.  At this point, I did firmly grasp the pedicle screws across the L2-3 intervertebral  space using a Kocher at each screw.  I proceeded with a pushing and pulling maneuver, in order to assess whether the patient was in fact successfully fused across this interspace.  No motion was noted, confirming a successful fusion.  I then performed the same maneuver at L3-4, and again, a successful fusion was noted.  At this point, the bilateral pedicle screws at L2, L3, and L4 were uneventfully removed.  Bone wax was placed in  the L4 pedicle screw holes.  At L2 and L3, I did use a 7 mm tap, and did upsize the screws to 7 x 35 mm screws.  At this point, I suppress the expose the facet joints bilaterally at T12/L1, and L1-2.  I did cannulate the T12 and L1 pedicles using a standard technique.  Bone wax was placed in the cannulated pedicle holes.  At this point, I proceeded with the decompression.  I performed a partial facetectomy bilaterally at L1-2, removing significant ligamentum flavum hypertrophy and facet hypertrophy, thereby decompressing the right and left lateral recess.  The same was performed at T12/L1.  At the T12/L1 level, the right-sided facet joint was removed in its entirety, and a large foraminal disc herniation was identified and removed, thereby decompressing the exiting right T12 nerve.  I was very pleased with the decompression at both levels.  Then, at the L1/L2 level, with an assistant holding medial retraction of the traversing right L2 nerve, I performed a thorough and complete discectomy, using a series of paddle scrapers and pituitary rongeurs and curettes.  I then liberally filled the intervertebral space with allograft and autograft, and an intervertebral spacer was then placed, which was expanded to approximately 10 mm in height.  I was very pleased with the press-fit of the implant.  I then turned my attention to the T12/L1 level.  I did not proceed with any medial retraction, given the fact that the spinal cord was present at this level.  I was however able to work lateral to the L1 nerve, and inferior to the exiting right T12 nerve, and in doing so, I was able to perform a thorough and complete intervertebral discectomy.  Initially, I did not feel that a discectomy for a fusion would be possible, although I did feel I had enough working room to safely proceed with a transforaminal fusion.  Once the discectomy was accomplished, the intervertebral space was then packed with allograft and autograft, and an  expandable intervertebral spacer was placed, and expanded to approximately 8 mm in height.  I was also very pleased with this spacer.  At this point, the posterolateral gutter on the left side at T12/L1, and L1-2, was decorticated and abundant allograft and autograft was packed into the posterolateral gutter on the left, and also into the facet joints.  Prior to this, the wound was copiously irrigated.  At this point, 6 x 35 mm screws were placed at T12, and 6 x 40 mm screws were placed at L1.  100 mm rods were placed bilaterally, securing the screw heads from T12-L3.  Caps were placed and a final locking procedure was performed.  I was very pleased with the final construct on both AP and lateral images.  I was very pleased with the decompression as well.  I did liberally use intraoperative fluoroscopy throughout the procedure, in order to ensure safe positioning of the hardware. The wound was explored for any undue bleeding and there was no substantial bleeding encountered.  Gel-Foam was placed over the  laminectomy site.  The wound was then closed in layers using #1 Vicryl followed by 2-0 Vicryl, followed by 4-0 Monocryl.  Benzoin and Steri-Strips were applied followed by sterile dressing.        Of note, Pricilla Holm was my assistant throughout surgery, and did aid in retraction, the decompression, placement of the hardware, suctioning, and closure.     Phylliss Bob, MD

## 2021-10-13 NOTE — H&P (Signed)
PREOPERATIVE H&P  Chief Complaint: Right leg pain  HPI: Cheryl Finley is a 76 y.o. female who presents with ongoing pain in the right leg   MRI reveals a large foraminal disc herniation on the right at T12/L1  Patient has failed multiple forms of conservative care and continues to have pain (see office notes for additional details regarding the patient's full course of treatment)  Past Medical History:  Diagnosis Date   Age-related macular degeneration    Anemia    using iron daily   Arthritis    OA- knees, back, spine, hands    Asthma    Asthmatic bronchitis    Cancer ([place]) 2017   breast - pre-cancer   Complication of anesthesia    hard to wake    Diabetes mellitus without complication [facility]) 2409   Dr. Nicholes Stairs- endocrinologist- with [facility]   Family history of adverse reaction to anesthesia    Mother of pt. hard to wake up    Heart murmur    Moderate Aortic Stenosis 08/16/21 echo (Dr. Elonda Husky, [place])   History of hiatal hernia    Hyperlipemia    Hypertension    Hypothyroidism    Peripheral vascular disease ([place]) 1981   DVT- bilateral LE- postop- hysterectomy   Pneumonia    as a baby - double pneumonia - still has scarring in her lungs    Sleep apnea    CPAP- anytime she rests   Past Surgical History:  Procedure Laterality Date   ABDOMINAL HYSTERECTOMY     ANTERIOR LAT LUMBAR FUSION Left 02/25/2020   Procedure: LEFT-LATERAL INTERBODY FUSION LUMBAR 2 - LUMBAR 3 WITH INSTRUMENTATION AND ALLOGRAFT;  Surgeon: Phylliss Bob, MD;  Location: [facility];  Service: Orthopedics;  Laterality: Left;   BREAST SURGERY Right 2017   pathology- precancer   CERVICAL SPINE SURGERY     CHOLECYSTECTOMY     DILATION AND CURETTAGE OF UTERUS     EYE SURGERY Bilateral    cataracts removed.    hystersalpingogram     KNEE CARTILAGE SURGERY Bilateral    2015, 2020   PARTIAL HYSTERECTOMY     SHOULDER ARTHROSCOPY WITH OPEN ROTATOR CUFF REPAIR Left    THYROIDECTOMY  unsure    TONSILLECTOMY     TRANSFORAMINAL LUMBAR INTERBODY FUSION (TLIF) WITH PEDICLE SCREW FIXATION 1 LEVEL Left 08/20/2019   Procedure: LEFT-SIDED LUMBAR 3-4 TRANSFORAMINAL LUMBAR INTERBODY FUSION WITH INSTRUMENTATION AND ALLOGRAFT;  Surgeon: Phylliss Bob, MD;  Location: [facility];  Service: Orthopedics;  Laterality: Left;   Social History   Socioeconomic History   Marital status: Married    Spouse name: Not on file   Number of children: Not on file   Years of education: Not on file   Highest education level: Not on file  Occupational History   Not on file  Tobacco Use   Smoking status: Never   Smokeless tobacco: Never  Vaping Use   Vaping Use: Never used  Substance and Sexual Activity   Alcohol use: No   Drug use: No   Sexual activity: Not on file  Other Topics Concern   Not on file  Social History Narrative   Not on file   Social Determinants of Health   Financial Resource Strain: Not on file  Food Insecurity: Not on file  Transportation Needs: Not on file  Physical Activity: Not on file  Stress: Not on file  Social Connections: Not on file   Family History  Problem Relation Age  of Onset   Diabetes Mother    Cancer Mother    Cancer Father    Cancer Sister    Diabetes Brother    Cancer Brother    Allergies  Allergen Reactions   Chlorhexidine     Soap/wipes caused pain and redness    Milk-Related Compounds     Causes a lot of mucus and induces bronchitis   Penicillins     Unknown, infant allergy pt received ancef intraoperatively on 02/26/2020 without any issues noted   Percodan [Oxycodone-Aspirin]     Causes severe headaches    Sulfa Antibiotics     Unknown, infant allergy   Tape     Redness and raw skin   Tramadol     Unknown reaction   Prior to Admission medications   Medication Sig Start Date End Date Taking? Authorizing Provider  amLODipine (NORVASC) 5 MG tablet Take 5 mg by mouth daily.   Yes [provider]  Ascorbic Acid (VITAMIN C) 1000 MG  tablet Take 1,000 mg by mouth 2 (two) times daily.    Yes [provider]  aspirin EC 81 MG tablet Take 81 mg by mouth at bedtime. Swallow whole.   Yes [provider]  atorvastatin (LIPITOR) 40 MG tablet Take 40 mg by mouth at bedtime.   Yes [provider]  Biotin w/ Vitamins C & E (HAIR/SKIN/NAILS PO) Take 1 tablet by mouth in the morning and at bedtime.    Yes [provider]  budesonide-formoterol (SYMBICORT) 160-4.5 MCG/ACT inhaler Inhale 2 puffs into the lungs 2 (two) times daily.    Yes [provider]  Calcium Carbonate-Vitamin D (CALCIUM 600+D PO) Take 1 tablet by mouth 2 (two) times daily.   Yes [provider]  cyclobenzaprine (FLEXERIL) 5 MG tablet Take 5-10 mg by mouth at bedtime as needed for muscle spasms. 08/08/21  Yes [provider]  Dapagliflozin-metFORMIN HCl ER (XIGDUO XR) 01-999 MG TB24 Take 2 tablets by mouth daily.   Yes [provider]  fenofibrate (TRICOR) 145 MG tablet Take 145 mg by mouth at bedtime.    Yes [provider]  ferrous sulfate 325 (65 FE) MG tablet Take 325 mg by mouth 2 (two) times a week.   Yes [provider]  Flaxseed, Linseed, (FLAX SEED OIL) 1000 MG CAPS Take 1,000 mg by mouth daily.   Yes [provider]  fluticasone (FLONASE) 50 MCG/ACT nasal spray Place 2 sprays into both nostrils at bedtime.   Yes [provider]  folic acid (FOLVITE) 073 MCG tablet Take 400 mcg by mouth at bedtime.   Yes [provider]  glimepiride (AMARYL) 4 MG tablet Take 4 mg by mouth daily after breakfast.   Yes [provider]  glipiZIDE (GLUCOTROL XL) 5 MG 24 hr tablet Take 5 mg by mouth daily with breakfast.   Yes [provider]  HYDROcodone-acetaminophen (NORCO) 10-325 MG tablet Take 1 tablet by mouth every 6 (six) hours as needed for pain. 09/05/21  Yes [provider]  insulin glargine, 2 Unit Dial, (TOUJEO MAX SOLOSTAR) 300  UNIT/ML Solostar Pen Inject 50 Units into the skin at bedtime.   Yes [provider]  levothyroxine (SYNTHROID) 125 MCG tablet Take 125 mcg by mouth daily before breakfast.    Yes [provider]  Melatonin 5 MG TABS Take 10 mg by mouth at bedtime.   Yes [provider]  meloxicam (MOBIC) 15 MG tablet Take 15 mg by mouth daily.  Yes [provider]  montelukast (SINGULAIR) 10 MG tablet Take 10 mg by mouth at bedtime.   Yes [provider]  Multiple Vitamins-Minerals (CENTRUM SILVER PO) Take 1 tablet by mouth at bedtime.    Yes [provider]  Semaglutide, 1 MG/DOSE, (OZEMPIC, 1 MG/DOSE,) 2 MG/1.5ML SOPN Inject 1 mg into the skin every Saturday.   Yes [provider]  spironolactone (ALDACTONE) 25 MG tablet Take 25 mg by mouth daily.   Yes [provider]  telmisartan (MICARDIS) 40 MG tablet Take 60 mg by mouth at bedtime.   Yes [provider]  albuterol (VENTOLIN HFA) 108 (90 Base) MCG/ACT inhaler Inhale 2 puffs into the lungs every 6 (six) hours as needed for wheezing or shortness of breath.    [provider]  furosemide (LASIX) 20 MG tablet Take 20 mg by mouth daily as needed for fluid.    [provider]     All other systems have been reviewed and were otherwise negative with the exception of those mentioned in the HPI and as above.  Physical Exam: Vitals:   10/13/21 0539  BP: (!) 177/63  Pulse: 88  Resp: 18  Temp: 98.7 F (37.1 C)  SpO2: 98%    Body mass index is 36.14 kg/m.  General: Alert, no acute distress Cardiovascular: No pedal edema Respiratory: No cyanosis, no use of accessory musculature Skin: No lesions in the area of chief complaint Neurologic: Sensation intact distally Psychiatric: Patient is competent for consent with normal mood and affect Lymphatic: No axillary or cervical lymphadenopathy   Assessment/Plan: LARGE FORAMINAL DISC HERNIATION AT T12/L1 ABOVE A  PREVIOUS FUSION UP TO L2 Plan for Procedure(s): THORACIC 12- LUMBAR 1, LUMBAR 1 - LUMBAR 2 DECOMPRESSION AND FUSION WITH INSTRUMENTATION AND ALLOGRAFT   Norva Karvonen, MD 10/13/2021 6:36 AM

## 2021-10-13 NOTE — Progress Notes (Signed)
Pharmacy Antibiotic Note  Cheryl Finley is a 76 y.o. female admitted on 10/13/2021 for lumbar surgery.  Pharmacy has been consulted for Vancomycin dosing x 1 dose ~12 hours post-op.  No drain.    Vancomycin 1gm IV given pre-op at 0642 am.  Plan: Vancomycin 1 gm IV x 1 at 8pm tonight. No follow up needed, Pharmacy signing off.  Height: 5\' 2"  (157.5 cm) Weight: 89.6 kg (197 lb 9.6 oz) IBW/kg (Calculated) : 50.1  Temp (24hrs), Avg:98.2 F (36.8 C), Min:97.8 F (36.6 C), Max:98.7 F (37.1 C)  Recent Labs  Lab 10/10/21 0953  WBC 7.0  CREATININE 0.77    Estimated Creatinine Clearance: 63.2 mL/min (by C-G formula based on SCr of 0.77 mg/dL).    Allergies  Allergen Reactions   Chlorhexidine     Soap/wipes caused pain and redness    Milk-Related Compounds     Causes a lot of mucus and induces bronchitis   Penicillins     Unknown, infant allergy pt received ancef intraoperatively on 02/26/2020 without any issues noted   Percodan [Oxycodone-Aspirin]     Causes severe headaches    Sulfa Antibiotics     Unknown, infant allergy   Tape     Redness and raw skin   Tramadol     Unknown reaction   Latex Rash    Thank you for allowing pharmacy to be a part of this patients care.  Arty Baumgartner, [place] 10/13/2021 3:56 PM

## 2021-10-13 NOTE — Transfer of Care (Signed)
Immediate Anesthesia Transfer of Care Note  Patient: Cheryl Finley  Procedure(s) Performed: THORACIC TWELVE- LUMBAR ONE, LUMBAR ONE - LUMBAR TWO DECOMPRESSION AND FUSION WITH INSTRUMENTATION AND ALLOGRAFT  Patient Location: PACU  Anesthesia Type:General  Level of Consciousness: drowsy, patient cooperative and responds to stimulation  Airway & Oxygen Therapy: Patient Spontanous Breathing and Patient connected to face mask oxygen  Post-op Assessment: Report given to RN and Post -op Vital signs reviewed and stable  Post vital signs: Reviewed and stable  Last Vitals:  Vitals Value Taken Time  BP 133/60 10/13/21 1337  Temp    Pulse 82 10/13/21 1337  Resp 11 10/13/21 1337  SpO2 99 % 10/13/21 1337  Vitals shown include unvalidated device data.  Last Pain:  Vitals:   10/13/21 0547  TempSrc:   PainSc: 5       Patients Stated Pain Goal: 3 (60/10/93 2355)  Complications: No notable events documented.

## 2021-10-13 NOTE — Anesthesia Procedure Notes (Signed)
Procedure Name: Intubation Date/Time: 10/13/2021 8:00 AM Performed by: Michele Rockers, CRNA Pre-anesthesia Checklist: Patient identified, Patient being monitored, Timeout performed, Emergency Drugs available and Suction available Patient Re-evaluated:Patient Re-evaluated prior to induction Oxygen Delivery Method: Circle system utilized Preoxygenation: Pre-oxygenation with 100% oxygen Induction Type: IV induction Ventilation: Mask ventilation without difficulty Laryngoscope Size: Miller and 2 Grade View: Grade II Tube type: Oral Tube size: 7.0 mm Number of attempts: 1 Airway Equipment and Method: Stylet Placement Confirmation: ETT inserted through vocal cords under direct vision, positive ETCO2 and breath sounds checked- equal and bilateral Secured at: 21 cm Tube secured with: Tape Dental Injury: Teeth and Oropharynx as per pre-operative assessment

## 2021-10-14 LAB — GLUCOSE, CAPILLARY: Glucose-Capillary: 156 mg/dL — ABNORMAL HIGH (ref 70–99)

## 2021-10-14 MED ORDER — HYDROCODONE-ACETAMINOPHEN 5-325 MG PO TABS: 1.0000 | ORAL_TABLET | ORAL | 0 refills | Status: AC | PRN

## 2021-10-14 MED ORDER — TIZANIDINE HCL 2 MG PO TABS: 2.0000 mg | ORAL_TABLET | Freq: Four times a day (QID) | ORAL | 2 refills | Status: AC | PRN

## 2021-10-14 MED FILL — Thrombin (Recombinant) For Soln 20000 Unit: CUTANEOUS | Qty: 1 | Status: AC

## 2021-10-14 NOTE — Progress Notes (Signed)
Patient alert and oriented, mae's well, voiding adequate amount of urine, swallowing without difficulty, no c/o pain at time of discharge. Patient discharged home with family. Script and discharged instructions given to patient. Patient and family stated understanding of instructions given. Patient has an appointment with Dr. Dumonski 

## 2021-10-14 NOTE — Progress Notes (Signed)
° ° °  Patient doing well  Denies leg pain   Physical Exam: Vitals:   10/14/21 0248 10/14/21 0250  BP: (!) 137/54 (!) 137/54  Pulse: (!) 101 (!) 101  Resp:  18  Temp: 99.4 F (37.4 C) 99.4 F (37.4 C)  SpO2: 99% 99%    Dressing in place NVI  POD #1 s/p lumbar decompression and fusion, doing well  - up with PT/OT, encourage ambulation - Percocet for pain, Robaxin for muscle spasms - likely d/c home today with f/u in 2 weeks

## 2021-10-14 NOTE — Evaluation (Signed)
Physical Therapy Evaluation Patient Details Name: Cheryl Finley MRN: [id number] DOB: [birth year] Today's Date: 10/14/2021  History of Present Illness  76 yo female s/p T12-L2 transforaminal lumbar interbody fusion, L T12-L2 posterolateral fusion, removal of previous instrumentation L4 and removal/reinsertion instrumentation bilat L2-3 on 1/13 due to large disc herniation T12/L1. PMH includes previous lumbar fusion 08/2019, anemia, OA, cancer, DM, HTN, HLD, PVD.  Clinical Impression   Pt presents with post-operative back pain and decreased activity tolerance vs baseline. Pt ambulated good hallway distance without AD, occasionally feels lightheaded but passes and states this is normal for her post-op and when taking pain medication. PT anticipates pt will progress well with mobility once home with husband, who is available to assist 24/7. Pt with no further questions, ready to d/c from a PT standpoint.         Recommendations for follow up therapy are one component of a multi-disciplinary discharge planning process, led by the attending physician.  Recommendations may be updated based on patient status, additional functional criteria and insurance authorization.  Follow Up Recommendations Follow physician's recommendations for discharge plan and follow up therapies    Assistance Recommended at Discharge Intermittent Supervision/Assistance  Patient can return home with the following  A little help with bathing/dressing/bathroom;Assist for transportation    Equipment Recommendations None recommended by PT  Recommendations for Other Services       Functional Status Assessment Patient has had a recent decline in their functional status and demonstrates the ability to make significant improvements in function in a reasonable and predictable amount of time.     Precautions / Restrictions Precautions Precautions: Fall;Back Precaution Booklet Issued: Yes (comment) Required Braces or Orthoses:  Spinal Brace Spinal Brace: Thoracolumbosacral orthotic;Applied in sitting position      Mobility  Bed Mobility Overal bed mobility: Modified Independent             General bed mobility comments: increased time, good log roll technique    Transfers Overall transfer level: Needs assistance Equipment used: None Transfers: Sit to/from Stand Sit to Stand: Supervision           General transfer comment: for safety, good technique of bending from hips/knees    Ambulation/Gait Ambulation/Gait assistance: Supervision Gait Distance (Feet): 125 Feet Assistive device: None Gait Pattern/deviations: Step-through pattern;Decreased stride length Gait velocity: decr     General Gait Details: for safety, pt with occasional lightheadedness which pt states is normal post-op for her, encouraged pt to take a standing rest break when feeling this way. Occasionally reaching for environment to self-steady, has RW at home to use PRN  Stairs            Wheelchair Mobility    Modified Rankin (Stroke Patients Only)       Balance Overall balance assessment: Mild deficits observed, not formally tested                                           Pertinent Vitals/Pain Pain Assessment: 0-10 Pain Score: 6  Pain Location: back Pain Descriptors / Indicators: Discomfort;Operative site guarding Pain Intervention(s): Limited activity within patient's tolerance;Monitored during session;Repositioned    Home Living Family/patient expects to be discharged to:: Private residence Living Arrangements: Spouse/significant other Available Help at Discharge: Family;Available 24 hours/day Type of Home: House (condo) Home Access: Level entry (1 threshold step)       Home Layout: One  level Home Equipment: Cane - single Barista (2 wheels);Shower seat - built in      Prior Function Prior Level of Function : Independent/Modified Independent                      Hand Dominance   Dominant Hand: Right    Extremity/Trunk Assessment   Upper Extremity Assessment Upper Extremity Assessment: Defer to OT evaluation    Lower Extremity Assessment Lower Extremity Assessment: Generalized weakness    Cervical / Trunk Assessment Cervical / Trunk Assessment: Back Surgery  Communication   Communication: No difficulties  Cognition Arousal/Alertness: Awake/alert Behavior During Therapy: WFL for tasks assessed/performed Overall Cognitive Status: Within Functional Limits for tasks assessed                                          General Comments      Exercises Other Exercises Other Exercises: home walking program: PT encouraged up and walking 1x/hour during waking hours to decrease stiffness, promote strength maintenance and circulation   Assessment/Plan    PT Assessment Patient does not need any further PT services  PT Problem List         PT Treatment Interventions      PT Goals (Current goals can be found in the Care Plan section)  Acute Rehab PT Goals Patient Stated Goal: home PT Goal Formulation: With patient Time For Goal Achievement: 10/14/21 Potential to Achieve Goals: Good    Frequency       Co-evaluation               AM-PAC PT "6 Clicks" Mobility  Outcome Measure Help needed turning from your back to your side while in a flat bed without using bedrails?: None Help needed moving from lying on your back to sitting on the side of a flat bed without using bedrails?: None Help needed moving to and from a bed to a chair (including a wheelchair)?: None Help needed standing up from a chair using your arms (e.g., wheelchair or bedside chair)?: None Help needed to walk in hospital room?: A Little Help needed climbing 3-5 steps with a railing? : A Little 6 Click Score: 22    End of Session Equipment Utilized During Treatment: Back brace Activity Tolerance: Patient tolerated treatment well Patient  left: in bed;with call bell/phone within reach Nurse Communication: Mobility status PT Visit Diagnosis: Other abnormalities of gait and mobility (R26.89)    Time: 9326-7124 PT Time Calculation (min) (ACUTE ONLY): 12 min   Charges:   PT Evaluation $PT Eval Low Complexity: 1 Low          Deric Bocock S, PT DPT Acute Rehabilitation Services Pager [phone]  Office [phone]   Zayyan Mullen E Ruffin Pyo 10/14/2021, 9:04 AM

## 2021-10-14 NOTE — Plan of Care (Signed)
Adequately ready for Discharge

## 2021-10-14 NOTE — Evaluation (Signed)
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 PMH includes previous lumbar fusion 08/2019, anemia, OA, cancer, DM, HTN, HLD, PVD.   Clinical Impression   This 76 yo female admitted and underwent above presents to acute OT with all education completed, we will D/C from acute OT.      Recommendations for follow up therapy are one component of a multi-disciplinary discharge planning process, led by the attending physician.  Recommendations may be updated based on patient status, additional functional criteria and insurance authorization.   Follow Up Recommendations  No OT follow up    Assistance Recommended at Discharge None  Patient can return home with the following A little help with bathing/dressing/bathroom    Functional Status Assessment  Patient has had a recent decline in their functional status and demonstrates the ability to make significant improvements in function in a reasonable and predictable amount of time.  Equipment Recommendations  None recommended by OT       Precautions / Restrictions Precautions Precautions: Fall;Back Precaution Booklet Issued: Yes (comment) Required Braces or Orthoses: Spinal Brace Spinal Brace: Thoracolumbosacral orthotic;Applied in sitting position Restrictions Weight Bearing Restrictions: No      Mobility Bed Mobility          General bed mobility comments: sitting on EOB upon arrival    Transfers Overall transfer level: Needs assistance Equipment used: None Transfers: Sit to/from Stand Sit to Stand: Supervision               Balance Overall balance assessment: Mild  deficits observed, not formally tested                                         ADL either performed or assessed with clinical judgement   ADL Overall ADL's : Needs assistance/impaired Eating/Feeding: Independent;Sitting     Grooming Details (indicate cue type and reason): educated on using 2 cups for brushing teeth to avoid bending over sink (one to spit and one to rinse) Upper Body Bathing: Supervision/ safety;Set up;Sitting   Lower Body Bathing: Minimal assistance Lower Body Bathing Details (indicate cue type and reason): S sit<>stand Upper Body Dressing : Supervision/safety;Set up;Sitting   Lower Body Dressing: Minimal assistance Lower Body Dressing Details (indicate cue type and reason): for socks (reports husband will A when she wears socks--which is not often) Toilet Transfer: Supervision/safety;Ambulation     Toileting - Clothing Manipulation Details (indicate cue type and reason): Educated on use of wet wipes for back peri care (to avoid as much twisting)             Vision Ability to See in Adequate Light: 0 Adequate              Pertinent Vitals/Pain Pain Assessment: 0-10 Pain Score: 6  Pain Location: back Pain Descriptors / Indicators: Discomfort;Operative site guarding Pain Intervention(s): Limited activity within patient's tolerance;Monitored during session;Patient requesting pain meds-RN notified     Hand Dominance Right   Extremity/Trunk Assessment Upper Extremity Assessment Upper Extremity Assessment: Overall WFL for tasks assessed     Communication Communication Communication: No difficulties   Cognition Arousal/Alertness: Awake/alert Behavior During Therapy:  WFL for tasks assessed/performed Overall Cognitive Status: Within Functional Limits for tasks assessed                                                  Home Living Family/patient expects to be discharged to:: Private residence Living Arrangements:  Spouse/significant other Available Help at Discharge: Family;Available 24 hours/day Type of Home: House (condo) Home Access: Level entry (1 threshold step)     Home Layout: One level     Bathroom Shower/Tub: Occupational psychologist: Standard     Home Equipment: Cane - single Barista (2 wheels);Shower seat - built in;BSC/3in1;Grab bars - tub/shower;Hand held shower head          Prior Functioning/Environment Prior Level of Function : Independent/Modified Independent                        OT Problem List: Decreased range of motion;Impaired balance (sitting and/or standing);Pain         OT Goals(Current goals can be found in the care plan section) Acute Rehab OT Goals Patient Stated Goal: to go home today         AM-PAC OT "6 Clicks" Daily Activity     Outcome Measure Help from another person eating meals?: None Help from another person taking care of personal grooming?: None Help from another person toileting, which includes using toliet, bedpan, or urinal?: None Help from another person bathing (including washing, rinsing, drying)?: None Help from another person to put on and taking off regular upper body clothing?: None Help from another person to put on and taking off regular lower body clothing?: A Little 6 Click Score: 23   End of Session Equipment Utilized During Treatment: Back brace Nurse Communication:  (no OT needs, pt ready to go)  Activity Tolerance: Patient tolerated treatment well Patient left:  (sitting on side of bed waiting on RN to come in with her discharge papers)  OT Visit Diagnosis: Unsteadiness on feet (R26.81);Pain Pain - part of body:  (incisional)                Time: 1975-8832 OT Time Calculation (min): 20 min Charges:  OT General Charges $OT Visit: 1 Visit OT Evaluation $OT Eval Moderate Complexity: 1 Mod  Golden Circle, OTR/L Acute [employer] Pager [phone] Office  [phone]    Almon Register 10/14/2021, 9:58 AM

## 2021-10-16 NOTE — Anesthesia Postprocedure Evaluation (Signed)
Anesthesia Post Note  Patient: Cheryl Finley  Procedure(s) Performed: THORACIC TWELVE- LUMBAR ONE, LUMBAR ONE - LUMBAR TWO DECOMPRESSION AND FUSION WITH INSTRUMENTATION AND ALLOGRAFT     Patient location during evaluation: PACU Anesthesia Type: General Level of consciousness: awake and sedated Pain management: pain level controlled Vital Signs Assessment: post-procedure vital signs reviewed and stable Respiratory status: spontaneous breathing Cardiovascular status: stable Postop Assessment: no apparent nausea or vomiting Anesthetic complications: no   No notable events documented.  Last Vitals:  Vitals:   10/14/21 0727 10/14/21 0935  BP: (!) 141/57   Pulse: 98   Resp: 16   Temp: 37.3 C   SpO2: 97% 92%    Last Pain:  Vitals:   10/14/21 0727  TempSrc: Oral  PainSc:    Pain Goal: Patients Stated Pain Goal: 3 (10/14/21 0552)                 Huston Foley

## 2021-10-18 ENCOUNTER — Encounter (HOSPITAL_COMMUNITY): Payer: Self-pay | Admitting: Orthopedic Surgery

## 2021-10-19 NOTE — Discharge Summary (Signed)
Patient ID: Cheryl Finley MRN: [id number] DOB/AGE: 1946/07/12 76 y.o.  Admit date: 10/13/2021 Discharge date: 10/14/2021  Admission Diagnoses:  Principal Problem:   Radiculopathy   Discharge Diagnoses:  Same  Past Medical History:  Diagnosis Date   Age-related macular degeneration    Anemia    using iron daily   Arthritis    OA- knees, back, spine, hands    Asthma    Asthmatic bronchitis    Cancer ([place]) 2017   breast - pre-cancer   Complication of anesthesia    hard to wake    Diabetes mellitus without complication [facility]) 1194   Dr. Nicholes Stairs- endocrinologist- with [facility]   Family history of adverse reaction to anesthesia    Mother of pt. hard to wake up    Heart murmur    Moderate Aortic Stenosis 08/16/21 echo (Dr. Elonda Husky, [place] [place])   History of hiatal hernia    Hyperlipemia    Hypertension    Hypothyroidism    Peripheral vascular disease ([place]) 1981   DVT- bilateral LE- postop- hysterectomy   Pneumonia    as a baby - double pneumonia - still has scarring in her lungs    Sleep apnea    CPAP- anytime she rests    Surgeries: Procedure(s): THORACIC TWELVE- LUMBAR ONE, LUMBAR ONE - LUMBAR TWO DECOMPRESSION AND FUSION WITH INSTRUMENTATION AND ALLOGRAFT on 10/13/2021   Consultants: None  Discharged Condition: Improved  Hospital Course: Cheryl Finley is an 76 y.o. female who was admitted 10/13/2021 for operative treatment of Radiculopathy. Patient has severe unremitting pain that affects sleep, daily activities, and work/hobbies. After pre-op clearance the patient was taken to the operating room on 10/13/2021 and underwent  Procedure(s): THORACIC TWELVE- LUMBAR ONE, LUMBAR ONE - LUMBAR TWO DECOMPRESSION AND FUSION WITH INSTRUMENTATION AND ALLOGRAFT.    Patient was given perioperative antibiotics:  Anti-infectives (From admission, onward)    Start     Dose/Rate Route Frequency Ordered Stop   10/13/21 2000  vancomycin (VANCOCIN) IVPB 1000 mg/200 mL premix         1,000 mg 200 mL/hr over 60 Minutes Intravenous  Once 10/13/21 1556 10/13/21 2010   10/13/21 0600  vancomycin (VANCOCIN) IVPB 1000 mg/200 mL premix        1,000 mg 200 mL/hr over 60 Minutes Intravenous On call to O.R. 10/13/21 0543 10/13/21 1740        Patient was given sequential compression devices, early ambulation to prevent DVT.  Patient benefited maximally from hospital stay and there were no complications.    Recent vital signs: BP (!) 141/57 (BP Location: Left Arm)    Pulse 98    Temp 99.1 F (37.3 C) (Oral)    Resp 16    Ht 5\' 2"  (1.575 m)    Wt 89.6 kg    SpO2 92%    BMI 36.14 kg/m    Discharge Medications:   Allergies as of 10/14/2021       Reactions   Chlorhexidine    Soap/wipes caused pain and redness    Milk-related Compounds    Causes a lot of mucus and induces bronchitis   Penicillins    Unknown, infant allergy pt received ancef intraoperatively on 02/26/2020 without any issues noted   Percodan [oxycodone-aspirin]    Causes severe headaches    Sulfa Antibiotics    Unknown, infant allergy   Tape    Redness and raw skin   Tramadol    Unknown reaction   Latex Rash  Medication List     TAKE these medications    albuterol 108 (90 Base) MCG/ACT inhaler Commonly known as: VENTOLIN HFA Inhale 2 puffs into the lungs every 6 (six) hours as needed for wheezing or shortness of breath.   amLODipine 5 MG tablet Commonly known as: NORVASC Take 5 mg by mouth daily.   atorvastatin 40 MG tablet Commonly known as: LIPITOR Take 40 mg by mouth at bedtime.   budesonide-formoterol 160-4.5 MCG/ACT inhaler Commonly known as: SYMBICORT Inhale 2 puffs into the lungs 2 (two) times daily.   CALCIUM 600+D PO Take 1 tablet by mouth 2 (two) times daily.   CENTRUM SILVER PO Take 1 tablet by mouth at bedtime.   cyclobenzaprine 5 MG tablet Commonly known as: FLEXERIL Take 5-10 mg by mouth at bedtime as needed for muscle spasms.   fenofibrate 145 MG  tablet Commonly known as: TRICOR Take 145 mg by mouth at bedtime.   ferrous sulfate 325 (65 FE) MG tablet Take 325 mg by mouth 2 (two) times a week.   fluticasone 50 MCG/ACT nasal spray Commonly known as: FLONASE Place 2 sprays into both nostrils at bedtime.   folic acid 329 MCG tablet Commonly known as: FOLVITE Take 400 mcg by mouth at bedtime.   furosemide 20 MG tablet Commonly known as: LASIX Take 20 mg by mouth daily as needed for fluid.   glimepiride 4 MG tablet Commonly known as: AMARYL Take 4 mg by mouth daily after breakfast.   glipiZIDE 5 MG 24 hr tablet Commonly known as: GLUCOTROL XL Take 5 mg by mouth daily with breakfast.   HAIR/SKIN/NAILS PO Take 1 tablet by mouth in the morning and at bedtime.   HYDROcodone-acetaminophen 5-325 MG tablet Commonly known as: NORCO/VICODIN Take 1-2 tablets by mouth every 4 (four) hours as needed for moderate pain ((score 4 to 6)).   levothyroxine 125 MCG tablet Commonly known as: SYNTHROID Take 125 mcg by mouth daily before breakfast.   melatonin 5 MG Tabs Take 10 mg by mouth at bedtime.   montelukast 10 MG tablet Commonly known as: SINGULAIR Take 10 mg by mouth at bedtime.   Ozempic (1 MG/DOSE) 2 MG/1.5ML Sopn Generic drug: Semaglutide (1 MG/DOSE) Inject 1 mg into the skin every Saturday.   spironolactone 25 MG tablet Commonly known as: ALDACTONE Take 25 mg by mouth daily.   telmisartan 40 MG tablet Commonly known as: MICARDIS Take 60 mg by mouth at bedtime.   tiZANidine 2 MG tablet Commonly known as: ZANAFLEX Take 1 tablet (2 mg total) by mouth every 6 (six) hours as needed for muscle spasms.   Toujeo Max SoloStar 300 UNIT/ML Solostar Pen Generic drug: insulin glargine (2 Unit Dial) Inject 50 Units into the skin at bedtime.   vitamin C 1000 MG tablet Take 1,000 mg by mouth 2 (two) times daily.   Xigduo XR 01-999 MG Tb24 Generic drug: Dapagliflozin-metFORMIN HCl ER Take 2 tablets by mouth daily.         Diagnostic Studies: DG THORACOLUMABAR SPINE  Result Date: 10/13/2021 CLINICAL DATA:  Back pain, lumbar spine surgery EXAM: THORACOLUMBAR SPINE 1V COMPARISON:  None. FINDINGS: Fluoroscopic images show evidence of lumbar spinal fusion from T12-L3 levels. Fluoroscopic time was 50.7 seconds. Radiation dose is 38.67 mGy. IMPRESSION: Fluoroscopic assistance was provided for lumbar spinal fusion. Electronically Signed   By: Elmer Picker M.D.   On: 10/13/2021 13:04   DG Lumbar Spine 1 View  Result Date: 10/13/2021 CLINICAL DATA:  Cross-table lateral view was  done for intraoperative localization EXAM: LUMBAR SPINE - 1 VIEW COMPARISON:  MR lumbar spine done on 08/27/2021 FINDINGS: There is previous lumbar fusion at L2-L3 and L3-L4 levels. Intervertebral disc spacer is seen at L2-L3 level. There is narrowing of disc space along with bony spurs and facet hypertrophy at multiple levels, more so at L4-L5 and L5-S1 levels. IMPRESSION: Cross-table lateral view was performed for intraoperative localization Electronically Signed   By: Elmer Picker M.D.   On: 10/13/2021 13:02   DG C-Arm 1-60 Min-No Report  Result Date: 10/13/2021 Fluoroscopy was utilized by the requesting physician.  No radiographic interpretation.   DG C-Arm 1-60 Min-No Report  Result Date: 10/13/2021 Fluoroscopy was utilized by the requesting physician.  No radiographic interpretation.   DG C-Arm 1-60 Min-No Report  Result Date: 10/13/2021 Fluoroscopy was utilized by the requesting physician.  No radiographic interpretation.   DG C-Arm 1-60 Min-No Report  Result Date: 10/13/2021 Fluoroscopy was utilized by the requesting physician.  No radiographic interpretation.   DG C-Arm 1-60 Min-No Report  Result Date: 10/13/2021 Fluoroscopy was utilized by the requesting physician.  No radiographic interpretation.    Disposition: Discharge disposition: 01-Home or Self Care      POD #1 s/p lumbar decompression and fusion,  doing well   - up with PT/OT, encourage ambulation - Percocet for pain, Robaxin for muscle spasms -Scripts for pain sent to pharmacy electronically  -D/C instructions sheet printed and in chart -D/C today  -F/U in office 2 weeks   Signed: Lennie Muckle Taro Hidrogo 10/19/2021, 12:22 PM

## 2021-10-20 MED FILL — Sodium Chloride Irrigation Soln 0.9%: Qty: 3000 | Status: AC

## 2021-10-20 MED FILL — Heparin Sodium (Porcine) Inj 1000 Unit/ML: INTRAMUSCULAR | Qty: 30 | Status: AC

## 2021-10-20 MED FILL — Sodium Chloride IV Soln 0.9%: INTRAVENOUS | Qty: 1000 | Status: AC

## 2022-01-31 ENCOUNTER — Encounter: Payer: Self-pay | Admitting: Physical Therapy

## 2022-01-31 ENCOUNTER — Ambulatory Visit: Payer: Medicare Other | Attending: Orthopedic Surgery | Admitting: Physical Therapy

## 2022-01-31 DIAGNOSIS — M5459 Other low back pain: Secondary | ICD-10-CM | POA: Diagnosis present

## 2022-01-31 DIAGNOSIS — R262 Difficulty in walking, not elsewhere classified: Secondary | ICD-10-CM | POA: Diagnosis present

## 2022-01-31 DIAGNOSIS — M6281 Muscle weakness (generalized): Secondary | ICD-10-CM | POA: Diagnosis present

## 2022-01-31 DIAGNOSIS — R2681 Unsteadiness on feet: Secondary | ICD-10-CM | POA: Insufficient documentation

## 2022-01-31 NOTE — Therapy (Signed)
[place] [facility] ?[place] ?[place], [place], [address] ?Phone: [phone]   Fax:  [phone] ? ?Physical Therapy Evaluation ? ?Patient Details  ?Name: Cheryl Finley ?MRN: [id number] ?Date of Birth: [birth year] ?Referring Provider (PT): Phylliss Bob MD ? ? ?Encounter Date: 01/31/2022 ? ? PT End of Session - 01/31/22 1542   ? ? Visit Number 1   ? Number of Visits 12   ? Date for PT Re-Evaluation 03/14/22   ? Authorization Type U[employer] Medicare   ? Progress Note Due on Visit 10   ? PT Start Time 1450   ? PT Stop Time 1530   ? PT Time Calculation (min) 40 min   ? Activity Tolerance Patient tolerated treatment well   ? Behavior During Therapy [facility] for tasks assessed/performed   ? ?  ?  ? ?  ? ? ?Past Medical History:  ?Diagnosis Date  ? Age-related macular degeneration   ? Anemia   ? using iron daily  ? Arthritis   ? OA- knees, back, spine, hands   ? Asthma   ? Asthmatic bronchitis   ? Cancer [facility]) 2017  ? breast - pre-cancer  ? Complication of anesthesia   ? hard to wake   ? Diabetes mellitus without complication ([place]) 2542  ? Dr. Nicholes Stairs- endocrinologist- with [facility]  ? Family history of adverse reaction to anesthesia   ? Mother of pt. hard to wake up   ? Heart murmur   ? Moderate Aortic Stenosis 08/16/21 echo (Dr. Elonda Husky, [place])  ? History of hiatal hernia   ? Hyperlipemia   ? Hypertension   ? Hypothyroidism   ? Peripheral vascular disease ([place]) 1981  ? DVT- bilateral LE- postop- hysterectomy  ? Pneumonia   ? as a baby - double pneumonia - still has scarring in her lungs   ? Sleep apnea   ? CPAP- anytime she rests  ? ? ?Past Surgical History:  ?Procedure Laterality Date  ? ABDOMINAL HYSTERECTOMY    ? ANTERIOR LAT LUMBAR FUSION Left 02/25/2020  ? Procedure: LEFT-LATERAL INTERBODY FUSION LUMBAR 2 - LUMBAR 3 WITH INSTRUMENTATION AND ALLOGRAFT;  Surgeon: Phylliss Bob, MD;  Location: [place];  Service: Orthopedics;  Laterality: Left;  ? BREAST SURGERY Right 2017   ? pathology- precancer  ? CERVICAL SPINE SURGERY    ? CHOLECYSTECTOMY    ? DILATION AND CURETTAGE OF UTERUS    ? EYE SURGERY Bilateral   ? cataracts removed.   ? hystersalpingogram    ? KNEE CARTILAGE SURGERY Bilateral   ? 2015, 2020  ? PARTIAL HYSTERECTOMY    ? SHOULDER ARTHROSCOPY WITH OPEN ROTATOR CUFF REPAIR Left   ? THYROIDECTOMY  unsure  ? TONSILLECTOMY    ? TRANSFORAMINAL LUMBAR INTERBODY FUSION (TLIF) WITH PEDICLE SCREW FIXATION 1 LEVEL Left 08/20/2019  ? Procedure: LEFT-SIDED LUMBAR 3-4 TRANSFORAMINAL LUMBAR INTERBODY FUSION WITH INSTRUMENTATION AND ALLOGRAFT;  Surgeon: Phylliss Bob, MD;  Location: [place];  Service: Orthopedics;  Laterality: Left;  ? TRANSFORAMINAL LUMBAR INTERBODY FUSION (TLIF) WITH PEDICLE SCREW FIXATION 2 LEVEL N/A 10/13/2021  ? Procedure: THORACIC TWELVE- LUMBAR ONE, LUMBAR ONE - LUMBAR TWO DECOMPRESSION AND FUSION WITH INSTRUMENTATION AND ALLOGRAFT;  Surgeon: Phylliss Bob, MD;  Location: [place];  Service: Orthopedics;  Laterality: N/A;  ? ? ?There were no vitals filed for this visit. ? ? ? Subjective Assessment - 01/31/22 1504   ? ? Subjective Pt. reports she had T-12-L2 fusion on 10/13/2021.  This is the 4th back surgery she has had in last 2 years so fused from T12 to L5.  She had spinal stenosis.   She was not allowed to have PT for 3 months after surgery, but reports surgeon has no lifted all precautions.  She reports that bending is hard, hard to pick up items off floor, also limited with walking and does not feel as steady or strong as used to.   ? Pertinent History HTN, T2DM, aortic valve stenosis, iron deficiency anemia, history of DVT, multiple back surgeries for stenosis   ? How long can you walk comfortably? <1/2 mile.  Used to walk 2 miles/day   ? Patient Stated Goals I want to know what my new normal is - what I can and cannot due.   ? Currently in Pain? Yes   ? Pain Score 0-No pain   ? Pain Location Back   ? Pain Orientation Right;Lower   ? Pain Descriptors / Indicators  Aching   ? Pain Type Surgical pain   ? Pain Onset More than a month ago   10/13/2021  ? Pain Relieving Factors take 2 extra strength tylenol or muscle relaxor and ache goes away.   ? ?  ?  ? ?  ? ? ? ? ? [facility] PT Assessment - 01/31/22 0001   ? ?  ? Assessment  ? Medical Diagnosis s/p T-12-L2 Fusion   ? Referring Provider (PT) Phylliss Bob MD   ? Onset Date/Surgical Date 10/13/21   ? Prior Therapy not after this surgery   ?  ? Precautions  ? Precautions None   ? Precaution Comments to tolerance but post surgical precautions have been lifted.   ?  ? Restrictions  ? Weight Bearing Restrictions No   ?  ? Balance Screen  ? Has the patient fallen in the past 6 months No   ? Has the patient had a decrease in activity level because of a fear of falling?  Yes   ? Is the patient reluctant to leave their home because of a fear of falling?  No   ?  ? Home Environment  ? Living Environment Private residence   townhome  ? Living Arrangements Spouse/significant other   ? Type of Home Other(Comment)   townhouse  ? Home Access Stairs to enter   ? Entrance Stairs-Number of Steps 1 small step   ? Home Layout One level   ?  ? Prior Function  ? Level of Independence Independent   ? Vocation Retired   ? 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 Leisure walking   ?  ? Cognition  ? Overall Cognitive Status Within Functional Limits for tasks assessed   ?  ? Observation/Other Assessments  ? Observations enters independently in no apparent distress   ?  ? Sensation  ? Light Touch Appears Intact   ? Additional Comments reports numbness R hip (L1 dermatome)   ?  ? Posture/Postural Control  ? Posture/Postural Control Postural limitations   ? Postural Limitations Rounded Shoulders;Forward head;Decreased lumbar lordosis   ?  ? ROM / Strength  ? AROM / PROM / Strength AROM;Strength   ?  ? AROM  ? Overall AROM  Deficits   ? AROM Assessment Site Lumbar   ? Lumbar Flexion to mid shin, flexing at hips   ? Lumbar Extension limited 50%   ?  Lumbar - Right Side Bend to above knee   reports R LB ache  ?  Lumbar - Left Side Bend unable   due to R LB tightness  ? Lumbar - Right Rotation limited 50%   ? Lumbar - Left Rotation limited 50%   ?  ? Strength  ? Overall Strength Within functional limits for tasks performed   ? Overall Strength Comments tested in sitting bil LE all myotomes, L hip flexion 4+/5, all other 5/5   ?  ? Ambulation/Gait  ? Ambulation/Gait Yes   ? Ambulation/Gait Assistance 7: Independent   ? Ambulation Distance (Feet) 600 Feet   ? Assistive device None   ? Gait Pattern Within Functional Limits   ?  ? 6 minute walk test results   ? Aerobic Endurance Distance Walked 600   ? Endurance additional comments dyspnea on exertion, needed sitting and standing rest breaks.   ?  ? Standardized Balance Assessment  ? Standardized Balance Assessment Five Times Sit to Stand   ? Five times sit to stand comments  17.5 seconds without UE assist, reports increased ache in LB   ? ?  ?  ? ?  ? ? ? ? ? ? ? ? ? ? ? ? ? ?Objective measurements completed on examination: See above findings.  ? ? ? ? ? ? ? ? ? ? ? ? ? ? PT Education - 01/31/22 1541   ? ? Education Details education on findings and plan of care.  Briefly discussed mattress choices.   ? Person(s) Educated Patient   ? Methods Explanation   ? Comprehension Verbalized understanding   ? ?  ?  ? ?  ? ? ? PT Short Term Goals - 01/31/22 1759   ? ?  ? PT SHORT TERM GOAL #1  ? Title Complete FOTO for lumbar spine   ? Time 2   ? Period Weeks   ? Status New   ? Target Date 02/14/22   ?  ? PT SHORT TERM GOAL #2  ? Title Complete DGI to assess balance and safety   ? Time 2   ? Period Weeks   ? Status New   ? Target Date 02/14/22   ? ?  ?  ? ?  ? ? ? ? PT Long Term Goals - 01/31/22 1800   ? ?  ? PT LONG TERM GOAL #1  ? Title Ind with progressive HEP to improve outcomes/carryover   ? Time 6   ? Period Weeks   ? Status New   ? Target Date 03/14/22   ?  ? PT LONG TERM GOAL #2  ? Title Pt. will demonstrate improved  endurance by being able to walk for 15 minutes without pain or dyspnea   ? Baseline 600'/4 min   ? Time 6   ? Period Weeks   ? Status New   ? Target Date 03/14/22   ?  ? PT LONG TERM GOAL #3  ? Title Pt. w

## 2022-02-02 ENCOUNTER — Encounter: Payer: Self-pay | Admitting: Physical Therapy

## 2022-02-02 ENCOUNTER — Ambulatory Visit: Payer: Medicare Other | Admitting: Physical Therapy

## 2022-02-02 DIAGNOSIS — M5459 Other low back pain: Secondary | ICD-10-CM | POA: Diagnosis not present

## 2022-02-02 DIAGNOSIS — R262 Difficulty in walking, not elsewhere classified: Secondary | ICD-10-CM

## 2022-02-02 DIAGNOSIS — M6281 Muscle weakness (generalized): Secondary | ICD-10-CM

## 2022-02-02 DIAGNOSIS — R2681 Unsteadiness on feet: Secondary | ICD-10-CM

## 2022-02-02 NOTE — Patient Instructions (Signed)
Access Code: [email or web address] ?URL: [email or web address] ?Date: 02/02/2022 ?Prepared by: Almyra Free ? ?Exercises ?- Supine Bridge  - 2 x daily - 7 x weekly - 1-3 sets - 10 reps ?- Supine Active Straight Leg Raise  - 2 x daily - 7 x weekly - 3 sets - 5 reps ?- Sit to Stand  - 3 x daily - 7 x weekly - 1-2 sets - 10 reps ?- Forward Step Up with Counter Support  - 1 x daily - 7 x weekly - 1-3 sets - 10 reps ?

## 2022-02-02 NOTE — Therapy (Signed)
[place] ?[facility] ?[place] ?[place], [place], [address] ?Phone: [phone]   Fax:  [phone] ? ?Physical Therapy Treatment ? ?Patient Details  ?Name: FANNIE GATHRIGHT ?MRN: [id number] ?Date of Birth: Dec 13, 1945 ?Referring Provider (PT): Phylliss Bob MD ? ? ?Encounter Date: 02/02/2022 ? ? PT End of Session - 02/02/22 0856   ? ? Visit Number 2   ? Number of Visits 12   ? Date for PT Re-Evaluation 03/14/22   ? Authorization Type UHC Medicare   ? Progress Note Due on Visit 10   ? PT Start Time [phone]   ? PT Stop Time 0931   ? PT Time Calculation (min) 35 min   ? Activity Tolerance Patient tolerated treatment well   ? Behavior During Therapy [facility] for tasks assessed/performed   ? ?  ?  ? ?  ? ? ?Past Medical History:  ?Diagnosis Date  ? Age-related macular degeneration   ? Anemia   ? using iron daily  ? Arthritis   ? OA- knees, back, spine, hands   ? Asthma   ? Asthmatic bronchitis   ? Cancer [facility]) 2017  ? breast - pre-cancer  ? Complication of anesthesia   ? hard to wake   ? Diabetes mellitus without complication ([place]) 1941  ? Dr. Nicholes Stairs- endocrinologist- with [facility]  ? Family history of adverse reaction to anesthesia   ? Mother of pt. hard to wake up   ? Heart murmur   ? Moderate Aortic Stenosis 08/16/21 echo (Dr. Elonda Husky, [place])  ? History of hiatal hernia   ? Hyperlipemia   ? Hypertension   ? Hypothyroidism   ? Peripheral vascular disease ([place]) 1981  ? DVT- bilateral LE- postop- hysterectomy  ? Pneumonia   ? as a baby - double pneumonia - still has scarring in her lungs   ? Sleep apnea   ? CPAP- anytime she rests  ? ? ?Past Surgical History:  ?Procedure Laterality Date  ? ABDOMINAL HYSTERECTOMY    ? ANTERIOR LAT LUMBAR FUSION Left 02/25/2020  ? Procedure: LEFT-LATERAL INTERBODY FUSION LUMBAR 2 - LUMBAR 3 WITH INSTRUMENTATION AND ALLOGRAFT;  Surgeon: Phylliss Bob, MD;  Location: [place];  Service: Orthopedics;  Laterality: Left;  ? BREAST SURGERY Right 2017   ? pathology- precancer  ? CERVICAL SPINE SURGERY    ? CHOLECYSTECTOMY    ? DILATION AND CURETTAGE OF UTERUS    ? EYE SURGERY Bilateral   ? cataracts removed.   ? hystersalpingogram    ? KNEE CARTILAGE SURGERY Bilateral   ? 2015, 2020  ? PARTIAL HYSTERECTOMY    ? SHOULDER ARTHROSCOPY WITH OPEN ROTATOR CUFF REPAIR Left   ? THYROIDECTOMY  unsure  ? TONSILLECTOMY    ? TRANSFORAMINAL LUMBAR INTERBODY FUSION (TLIF) WITH PEDICLE SCREW FIXATION 1 LEVEL Left 08/20/2019  ? Procedure: LEFT-SIDED LUMBAR 3-4 TRANSFORAMINAL LUMBAR INTERBODY FUSION WITH INSTRUMENTATION AND ALLOGRAFT;  Surgeon: Phylliss Bob, MD;  Location: [place];  Service: Orthopedics;  Laterality: Left;  ? TRANSFORAMINAL LUMBAR INTERBODY FUSION (TLIF) WITH PEDICLE SCREW FIXATION 2 LEVEL N/A 10/13/2021  ? Procedure: THORACIC TWELVE- LUMBAR ONE, LUMBAR ONE - LUMBAR TWO DECOMPRESSION AND FUSION WITH INSTRUMENTATION AND ALLOGRAFT;  Surgeon: Phylliss Bob, MD;  Location: [place];  Service: Orthopedics;  Laterality: N/A;  ? ? ?There were no vitals filed for this visit. ? ? Subjective Assessment - 02/02/22 0857   ? ? Subjective Feeling a little ache in right hip today   ?  Pertinent History HTN, T2DM, aortic valve stenosis, iron deficiency anemia, history of DVT, multiple back surgeries for stenosis   ? Patient Stated Goals I want to know what my new normal is - what I can and cannot due.   ? Currently in Pain? Yes   ? Pain Score 3    ? Pain Location Buttocks   ? Pain Orientation Right;Lower   ? Pain Descriptors / Indicators Aching   ? Pain Type Surgical pain   ? ?  ?  ? ?  ? ? ? ? ? [facility] PT Assessment - 02/02/22 0001   ? ?  ? Observation/Other Assessments  ? Focus on Therapeutic Outcomes (FOTO)  54 (predicted 55)   ?  ? Standardized Balance Assessment  ? Standardized Balance Assessment Dynamic Gait Index   ?  ? Dynamic Gait Index  ? Level Surface Mild Impairment   ? Change in Gait Speed Normal   ? Gait with Horizontal Head Turns Normal   ? Gait with Vertical Head Turns  Mild Impairment   ? Gait and Pivot Turn Normal   ? Step Over Obstacle Normal   ? Step Around Obstacles Normal   ? Steps Mild Impairment   ? Total Score 21   ? DGI comment: moderate fall risk   ? ?  ?  ? ?  ? ? ? ? ? ? ? ? ? ? ? ? ? ? ? ? [facility] Adult PT Treatment/Exercise - 02/02/22 0001   ? ?  ? Exercises  ? Exercises Lumbar   ?  ? Lumbar Exercises: Aerobic  ? Nustep L5 x 7 min   ?  ? Lumbar Exercises: Seated  ? Sit to Stand 10 reps;5 reps   ? Sit to Stand Limitations 5# with 10 reps; no wt with 5 reps first   ? Other Seated Lumbar Exercises march x 15 then x 6 bil  on dynadisc; LAQ x 10, then 10 bil on dynadisc   ?  ? Lumbar Exercises: Supine  ? Bridge 10 reps   ? Bridge with [employer] 10 reps   ? Straight Leg Raise 5 reps   ? Straight Leg Raises Limitations 5-10 reps bil; stopped when fatigued   ? ?  ?  ? ?  ? ? ? ? ? ? ? ? ? ? PT Education - 02/02/22 1123   ? ? Education Details HEP   ? Person(s) Educated Patient   ? Methods Explanation;Demonstration;Handout   ? Comprehension Verbalized understanding;Returned demonstration   ? ?  ?  ? ?  ? ? ? PT Short Term Goals - 02/02/22 1133   ? ?  ? PT SHORT TERM GOAL #1  ? Title Complete FOTO for lumbar spine   ? Status Achieved   ?  ? PT SHORT TERM GOAL #2  ? Title Complete DGI to assess balance and safety   ? Status Achieved   ? ?  ?  ? ?  ? ? ? ? PT Long Term Goals - 01/31/22 1800   ? ?  ? PT LONG TERM GOAL #1  ? Title Ind with progressive HEP to improve outcomes/carryover   ? Time 6   ? Period Weeks   ? Status New   ? Target Date 03/14/22   ?  ? PT LONG TERM GOAL #2  ? Title Pt. will demonstrate improved endurance by being able to walk for 15 minutes without pain or dyspnea   ? Baseline 600'/4 min   ?  Time 6   ? Period Weeks   ? Status New   ? Target Date 03/14/22   ?  ? PT LONG TERM GOAL #3  ? Title Pt. will demonstrate decreased fall risk by scoring at least 19/24 on DGI   ? Time 6   ? Period Weeks   ? Status New   ? Target Date 03/14/22   ?  ? PT LONG TERM GOAL #4   ? Title Pt. will be able to complete household ADLs without increased LBP/discomfort with good form.   ? Baseline limitations with doing household chores, picking items off floor   ? Time 6   ? Period Weeks   ? Status New   ? Target Date 03/14/22   ?  ? PT LONG TERM GOAL #5  ? Title Pt. will demonstrate improved functional LE strength by completing 5x STS <14 seconds   ? Baseline 17.5 sec, reports increased ache in low back   ? Time 6   ? Period Weeks   ? Status New   ? Target Date 03/14/22   ?  ? Additional Long Term Goals  ? Additional Long Term Goals Yes   ?  ? PT LONG TERM GOAL #6  ? Title Patient will be confident transitioning to community based exercise program (silver sneakers/water aerobics) to maintain progress.   ? Time 6   ? Period Weeks   ? Status New   ? Target Date 03/14/22   ? ?  ?  ? ?  ? ? ? ? ? ? ? ? Plan - 02/02/22 1123   ? ? Clinical Impression Statement Pt was 11 min late for appt today. We completed the DGI which she scored 21 indicating moderate fall risk. She is able to descend stairs without difficulty, but ascending shows some weakness and has slower speed. FOTO score is 54 indicating moderate difficulties with functional household activities. She did very well with initial core strengthening exercises and can likely be progressed to theraball. Initial HEP issued.   ? Comorbidities HTN, T2DM, aortic valve stenosis, iro deficiency anemia, history of DVT, multiple back surgeries   ? PT Treatment/Interventions ADLs/Self Care Home Management;Cryotherapy;Electrical Stimulation;Moist Heat;Gait training;Stair training;Functional mobility training;Therapeutic activities;Therapeutic exercise;Balance training;Neuromuscular re-education;Patient/family education;Manual techniques   ? PT Next Visit Plan lumbar strengthening, step ups   ? PT Home Exercise Plan [email or web address]   ? Consulted and Agree with Plan of Care Patient   ? ?  ?  ? ?  ? ? ?Patient will benefit from skilled therapeutic intervention in  order to improve the following deficits and impairments:  Decreased activity tolerance, Decreased endurance, Decreased range of motion, Decreased strength, Impaired sensation, Pain, Improper body mechanic

## 2022-02-07 NOTE — Therapy (Signed)
?OUTPATIENT PHYSICAL THERAPY TREATMENT NOTE ? ? ?Patient Name: Cheryl Finley ?MRN: [id number] ?DOB:1946/05/10, 76 y.o., female ?Today's Date: 02/08/2022 ? ?PCP: Stacie Glaze, DO ?REFERRING PROVIDER: Phylliss Bob, MD ? ? PT End of Session - 02/08/22 0802   ? ? Visit Number 3   ? Number of Visits 12   ? Date for PT Re-Evaluation 03/14/22   ? Authorization Type [employer] Medicare   ? Progress Note Due on Visit 10   ? PT Start Time 0802   ? PT Stop Time 2831   ? PT Time Calculation (min) 45 min   ? Activity Tolerance Patient tolerated treatment well   ? Behavior During Therapy [facility] for tasks assessed/performed   ? ?  ?  ? ?  ? ? ?Past Medical History:  ?Diagnosis Date  ? Age-related macular degeneration   ? Anemia   ? using iron daily  ? Arthritis   ? OA- knees, back, spine, hands   ? Asthma   ? Asthmatic bronchitis   ? Cancer [facility]) 2017  ? breast - pre-cancer  ? Complication of anesthesia   ? hard to wake   ? Diabetes mellitus without complication ([place]) 5176  ? Dr. Nicholes Stairs- endocrinologist- with [facility]  ? Family history of adverse reaction to anesthesia   ? Mother of pt. hard to wake up   ? Heart murmur   ? Moderate Aortic Stenosis 08/16/21 echo (Dr. Elonda Husky, [place])  ? History of hiatal hernia   ? Hyperlipemia   ? Hypertension   ? Hypothyroidism   ? Peripheral vascular disease ([place]) 1981  ? DVT- bilateral LE- postop- hysterectomy  ? Pneumonia   ? as a baby - double pneumonia - still has scarring in her lungs   ? Sleep apnea   ? CPAP- anytime she rests  ? ?Past Surgical History:  ?Procedure Laterality Date  ? ABDOMINAL HYSTERECTOMY    ? ANTERIOR LAT LUMBAR FUSION Left 02/25/2020  ? Procedure: LEFT-LATERAL INTERBODY FUSION LUMBAR 2 - LUMBAR 3 WITH INSTRUMENTATION AND ALLOGRAFT;  Surgeon: Phylliss Bob, MD;  Location: [place];  Service: Orthopedics;  Laterality: Left;  ? BREAST SURGERY Right 2017  ? pathology- precancer  ? CERVICAL SPINE SURGERY    ? CHOLECYSTECTOMY    ? DILATION AND CURETTAGE OF UTERUS    ? EYE SURGERY  Bilateral   ? cataracts removed.   ? hystersalpingogram    ? KNEE CARTILAGE SURGERY Bilateral   ? 2015, 2020  ? PARTIAL HYSTERECTOMY    ? SHOULDER ARTHROSCOPY WITH OPEN ROTATOR CUFF REPAIR Left   ? THYROIDECTOMY  unsure  ? TONSILLECTOMY    ? TRANSFORAMINAL LUMBAR INTERBODY FUSION (TLIF) WITH PEDICLE SCREW FIXATION 1 LEVEL Left 08/20/2019  ? Procedure: LEFT-SIDED LUMBAR 3-4 TRANSFORAMINAL LUMBAR INTERBODY FUSION WITH INSTRUMENTATION AND ALLOGRAFT;  Surgeon: Phylliss Bob, MD;  Location: [place];  Service: Orthopedics;  Laterality: Left;  ? TRANSFORAMINAL LUMBAR INTERBODY FUSION (TLIF) WITH PEDICLE SCREW FIXATION 2 LEVEL N/A 10/13/2021  ? Procedure: THORACIC TWELVE- LUMBAR ONE, LUMBAR ONE - LUMBAR TWO DECOMPRESSION AND FUSION WITH INSTRUMENTATION AND ALLOGRAFT;  Surgeon: Phylliss Bob, MD;  Location: [place];  Service: Orthopedics;  Laterality: N/A;  ? ?Patient Active Problem List  ? Diagnosis Date Noted  ? Radiculopathy 08/20/2019  ? ? ?REFERRING DIAG: s/p T-12-L2 Fusion  ? ?THERAPY DIAG:  ?Other low back pain ? ?Muscle weakness (generalized) ? ?Difficulty in walking, not elsewhere classified ? ?Unsteadiness on feet ? ?PERTINENT HISTORY: HTN, T2DM, aortic valve stenosis, iron  deficiency anemia, history of DVT, multiple back surgeries for stenosis ? ?PRECAUTIONS: none ? ?SUBJECTIVE: Quite a bit of aching since Friday, I try to walk a mile a day even though my back doesn't like it, I didn't walk yesterday so my back is doing pretty well.   Has not had a chance to do initial HEP consistently.  ? ?PAIN:  ?Are you having pain? No ? ? ?OBJECTIVE: (objective measures completed at initial evaluation unless otherwise dated) ?Sensation                                              01/31/2022  ?Light Touch Appears Intact   ?Additional Comments reports numbness R hip (L1 dermatome)   ?   ?Posture/Postural Control  ?Posture/Postural Control Postural limitations   ?Postural Limitations Rounded Shoulders;Forward head;Decreased lumbar  lordosis   ?   ?ROM / Strength  ?AROM / PROM / Strength AROM;Strength   ?   ?AROM  ?Overall AROM  Deficits   ?AROM Assessment Site Lumbar   ?Lumbar Flexion to mid shin, flexing at hips   ?Lumbar Extension limited 50%   ?Lumbar - Right Side Bend to above knee   reports R LB ache  ?Lumbar - Left Side Bend unable   due to R LB tightness  ?Lumbar - Right Rotation limited 50%   ?Lumbar - Left Rotation limited 50%   ?   ?Strength  ?Overall Strength Within functional limits for tasks performed   ?Overall Strength Comments tested in sitting bil LE all myotomes, L hip flexion 4+/5, all other 5/5   ?   ?Ambulation/Gait  ?Ambulation/Gait Yes   ?Ambulation/Gait Assistance 7: Independent   ?Ambulation Distance (Feet) 600 Feet   ?Assistive device None   ?Gait Pattern Within Functional Limits   ?   ?6 minute walk test results   ?Aerobic Endurance Distance Walked 600   ?Endurance additional comments dyspnea on exertion, needed sitting and standing rest breaks.   ?   ?Standardized Balance Assessment  ?Standardized Balance Assessment Five Times Sit to Stand   ?Five times sit to stand comments  17.5 seconds without UE assist, reports increased ache in LB   ? ? Observation/Other Assessments                                  02/02/2022  ?  Focus on Therapeutic Outcomes (FOTO)  54 (predicted 68)   ?     ?  Standardized Balance Assessment  ?  Standardized Balance Assessment Dynamic Gait Index   ?     ?  Dynamic Gait Index  ?  Level Surface Mild Impairment   ?  Change in Gait Speed Normal   ?  Gait with Horizontal Head Turns Normal   ?  Gait with Vertical Head Turns Mild Impairment   ?  Gait and Pivot Turn Normal   ?  Step Over Obstacle Normal   ?  Step Around Obstacles Normal   ?  Steps Mild Impairment   ?  Total Score 21   ?  DGI comment: moderate fall risk   ? ? ?TODAY'S TREATMENT:  ?02/08/22  Therapeutic Exercise: to improve strength and mobility.  Verbal and tactile cues throughout for technique. ?Bike level 2 x 6 min for  warm-up/subjective.  Sit to stands  2 x 10 cues for forward lean, step ups 2 x 10 bil (1 riser, no UE support), forward T's x 10 bil (in front of plinth for safety), at counter - heel raises x 20, leg extensions 2 x 10 bil, hip abduction 2 x 10 bil.  Hip flexion 2 x 10 bil. With UE support.  In supine - bridges 2 x 10, LTR x 10 (small), SLR x 10 bil.  ? ? ?PATIENT EDUCATION: ?Education details: HEP update, pain neuroscience eduation  ?Person educated: Patient ?Education method: Explanation, Demonstration, Verbal cues, and Handouts ?Education comprehension: verbalized understanding and returned demonstration ? ? ?[place]: ?[id number] ? ?ASSESSMENT: ?Clinical Impression: ?Focus of today's skilled intervention was reviewing and progressing HEP for both core strengthening and balance.  She was able to perform all exercises without pain but still had some discomfort in low back, needed short standing rest breaks between sets.  Discussed how to incorporate HEP into daily routine, and also encouraged to try water aerobics.  She would benefit from continued skilled therapy.  ? ? PT Short Term Goals - 02/02/22 1133   ? ?  ? PT SHORT TERM GOAL #1  ? Title Complete FOTO for lumbar spine   ? Status Achieved   ?  ? PT SHORT TERM GOAL #2  ? Title Complete DGI to assess balance and safety   ? Status Achieved   ? ?  ?  ? ?  ? ? ? PT Long Term Goals - 02/08/22 0806   ? ?  ? PT LONG TERM GOAL #1  ? Title Ind with progressive HEP to improve outcomes/carryover   ? Time 6   ? Period Weeks   ? Status On-going   ? Target Date 03/14/22   ?  ? PT LONG TERM GOAL #2  ? Title Pt. will demonstrate improved endurance by being able to walk for 15 minutes without pain or dyspnea   ? Baseline 600'/4 min   ? Time 6   ? Period Weeks   ? Status On-going   ? Target Date 03/14/22   ?  ? PT LONG TERM GOAL #3  ? Title Pt. will demonstrate decreased fall risk by scoring at least 19/24 on DGI   ? Time 6   ? Period Weeks   ? Status On-going   ?  Target Date 03/14/22   ?  ? PT LONG TERM GOAL #4  ? Title Pt. will be able to complete household ADLs without increased LBP/discomfort with good form.   ? Baseline limitations with doing household chores, picking items off

## 2022-02-08 ENCOUNTER — Ambulatory Visit: Payer: Medicare Other | Admitting: Physical Therapy

## 2022-02-08 ENCOUNTER — Encounter: Payer: Self-pay | Admitting: Physical Therapy

## 2022-02-08 DIAGNOSIS — M5459 Other low back pain: Secondary | ICD-10-CM

## 2022-02-08 DIAGNOSIS — R2681 Unsteadiness on feet: Secondary | ICD-10-CM

## 2022-02-08 DIAGNOSIS — R262 Difficulty in walking, not elsewhere classified: Secondary | ICD-10-CM

## 2022-02-08 DIAGNOSIS — M6281 Muscle weakness (generalized): Secondary | ICD-10-CM

## 2022-02-10 ENCOUNTER — Encounter: Payer: Self-pay | Admitting: Physical Therapy

## 2022-02-10 ENCOUNTER — Ambulatory Visit: Payer: Medicare Other | Admitting: Physical Therapy

## 2022-02-10 DIAGNOSIS — M6281 Muscle weakness (generalized): Secondary | ICD-10-CM

## 2022-02-10 DIAGNOSIS — M5459 Other low back pain: Secondary | ICD-10-CM | POA: Diagnosis not present

## 2022-02-10 DIAGNOSIS — R2681 Unsteadiness on feet: Secondary | ICD-10-CM

## 2022-02-10 DIAGNOSIS — R262 Difficulty in walking, not elsewhere classified: Secondary | ICD-10-CM

## 2022-02-10 NOTE — Therapy (Signed)
?OUTPATIENT PHYSICAL THERAPY TREATMENT NOTE ? ? ?Patient Name: Cheryl Finley ?MRN: [id number] ?DOB:[birth year], 76 y.o., female ?Today's Date: 02/10/2022 ? ?PCP: Stacie Glaze, DO ?REFERRING PROVIDER: Phylliss Bob, MD ? ? PT End of Session - 02/10/22 0803   ? ? Visit Number 4   ? Number of Visits 12   ? Date for PT Re-Evaluation 03/14/22   ? Authorization Type [employer] Medicare   ? Progress Note Due on Visit 10   ? PT Start Time [phone]   ? PT Stop Time [phone]   ? PT Time Calculation (min) 38 min   ? Activity Tolerance Patient tolerated treatment well   ? Behavior During Therapy [facility] for tasks assessed/performed   ? ?  ?  ? ?  ? ? ?Past Medical History:  ?Diagnosis Date  ? Age-related macular degeneration   ? Anemia   ? using iron daily  ? Arthritis   ? OA- knees, back, spine, hands   ? Asthma   ? Asthmatic bronchitis   ? Cancer [facility]) 2017  ? breast - pre-cancer  ? Complication of anesthesia   ? hard to wake   ? Diabetes mellitus without complication ([place]) 2263  ? Dr. Nicholes Stairs- endocrinologist- with [facility]  ? Family history of adverse reaction to anesthesia   ? Mother of pt. hard to wake up   ? Heart murmur   ? Moderate Aortic Stenosis 08/16/21 echo (Dr. Elonda Husky, [place])  ? History of hiatal hernia   ? Hyperlipemia   ? Hypertension   ? Hypothyroidism   ? Peripheral vascular disease ([place]) 1981  ? DVT- bilateral LE- postop- hysterectomy  ? Pneumonia   ? as a baby - double pneumonia - still has scarring in her lungs   ? Sleep apnea   ? CPAP- anytime she rests  ? ?Past Surgical History:  ?Procedure Laterality Date  ? ABDOMINAL HYSTERECTOMY    ? ANTERIOR LAT LUMBAR FUSION Left 02/25/2020  ? Procedure: LEFT-LATERAL INTERBODY FUSION LUMBAR 2 - LUMBAR 3 WITH INSTRUMENTATION AND ALLOGRAFT;  Surgeon: Phylliss Bob, MD;  Location: [place];  Service: Orthopedics;  Laterality: Left;  ? BREAST SURGERY Right 2017  ? pathology- precancer  ? CERVICAL SPINE SURGERY    ? CHOLECYSTECTOMY    ? DILATION AND CURETTAGE OF UTERUS    ? EYE SURGERY  Bilateral   ? cataracts removed.   ? hystersalpingogram    ? KNEE CARTILAGE SURGERY Bilateral   ? 2015, 2020  ? PARTIAL HYSTERECTOMY    ? SHOULDER ARTHROSCOPY WITH OPEN ROTATOR CUFF REPAIR Left   ? THYROIDECTOMY  unsure  ? TONSILLECTOMY    ? TRANSFORAMINAL LUMBAR INTERBODY FUSION (TLIF) WITH PEDICLE SCREW FIXATION 1 LEVEL Left 08/20/2019  ? Procedure: LEFT-SIDED LUMBAR 3-4 TRANSFORAMINAL LUMBAR INTERBODY FUSION WITH INSTRUMENTATION AND ALLOGRAFT;  Surgeon: Phylliss Bob, MD;  Location: [place];  Service: Orthopedics;  Laterality: Left;  ? TRANSFORAMINAL LUMBAR INTERBODY FUSION (TLIF) WITH PEDICLE SCREW FIXATION 2 LEVEL N/A 10/13/2021  ? Procedure: THORACIC TWELVE- LUMBAR ONE, LUMBAR ONE - LUMBAR TWO DECOMPRESSION AND FUSION WITH INSTRUMENTATION AND ALLOGRAFT;  Surgeon: Phylliss Bob, MD;  Location: [place];  Service: Orthopedics;  Laterality: N/A;  ? ?Patient Active Problem List  ? Diagnosis Date Noted  ? Radiculopathy 08/20/2019  ? ? ?REFERRING DIAG: s/p T-12-L2 Fusion  ? ?THERAPY DIAG:  ?Other low back pain ? ?Muscle weakness (generalized) ? ?Difficulty in walking, not elsewhere classified ? ?Unsteadiness on feet ? ?PERTINENT HISTORY: HTN, T2DM, aortic valve stenosis, iron  deficiency anemia, history of DVT, multiple back surgeries for stenosis ? ?PRECAUTIONS: none ? ?SUBJECTIVE: Pt. Reported back is achey today, but went to wholesale store yesterday "back to front, side to side, and pushing that basket" and didn't sit down much yesterday.   ? ?PAIN:  ?Are you having pain? Yes: NPRS scale: 3/10 ?Pain location: low back ? ? ?OBJECTIVE: (objective measures completed at initial evaluation unless otherwise dated) ?Sensation                                              01/31/2022  ?Light Touch Appears Intact   ?Additional Comments reports numbness R hip (L1 dermatome)   ?   ?Posture/Postural Control  ?Posture/Postural Control Postural limitations   ?Postural Limitations Rounded Shoulders;Forward head;Decreased lumbar  lordosis   ?   ?ROM / Strength  ?AROM / PROM / Strength AROM;Strength   ?   ?AROM  ?Overall AROM  Deficits   ?AROM Assessment Site Lumbar   ?Lumbar Flexion to mid shin, flexing at hips   ?Lumbar Extension limited 50%   ?Lumbar - Right Side Bend to above knee   reports R LB ache  ?Lumbar - Left Side Bend unable   due to R LB tightness  ?Lumbar - Right Rotation limited 50%   ?Lumbar - Left Rotation limited 50%   ?   ?Strength  ?Overall Strength Within functional limits for tasks performed   ?Overall Strength Comments tested in sitting bil LE all myotomes, L hip flexion 4+/5, all other 5/5   ?   ?Ambulation/Gait  ?Ambulation/Gait Yes   ?Ambulation/Gait Assistance 7: Independent   ?Ambulation Distance (Feet) 600 Feet   ?Assistive device None   ?Gait Pattern Within Functional Limits   ?   ?6 minute walk test results   ?Aerobic Endurance Distance Walked 600   ?Endurance additional comments dyspnea on exertion, needed sitting and standing rest breaks.   ?   ?Standardized Balance Assessment  ?Standardized Balance Assessment Five Times Sit to Stand   ?Five times sit to stand comments  17.5 seconds without UE assist, reports increased ache in LB   ? ? Observation/Other Assessments                                  02/02/2022  ?  Focus on Therapeutic Outcomes (FOTO)  54 (predicted 55)   ?     ?  Standardized Balance Assessment  ?  Standardized Balance Assessment Dynamic Gait Index   ?     ?  Dynamic Gait Index  ?  Level Surface Mild Impairment   ?  Change in Gait Speed Normal   ?  Gait with Horizontal Head Turns Normal   ?  Gait with Vertical Head Turns Mild Impairment   ?  Gait and Pivot Turn Normal   ?  Step Over Obstacle Normal   ?  Step Around Obstacles Normal   ?  Steps Mild Impairment   ?  Total Score 21   ?  DGI comment: moderate fall risk   ? ? ?TODAY'S TREATMENT:  ?02/10/22 Therapeutic Exercise: to improve strength and mobility.  Verbal and tactile cues throughout for technique. ?Nustep level 6 x 6 min warm-up and  subjective, seated flexion stretch with pball x 10, seated row and overhead press with  weighted yellow ball 2 x 15.  Seated flexion stretch end of session to release muscle tension.  ? ?Neuromuscular Reeducation: to improve balance and stability. SBA for safety throughout.  In corner on airex: Eyes open feet apart x 30 sec  ?Eyes open feet apart with head nods x 10 ?Eyes open feet apart with head turns x 10 ?Eyes closed feet apart x 30 sec ?Eyes closed feet apart with head nods x 10 ?Eyes closed feet apart with head turns x 10  ?Level surface: Tandem stance 2 x 30 sec bil .  ?Start excursion pattern forward/back/lateral steps 5x bil.  ? ?Manual therapy: IASTM with foam roller to R gluts in sitting to decrease muscle spasm.  ? ?02/08/22  Therapeutic Exercise: to improve strength and mobility.  Verbal and tactile cues throughout for technique. ?Bike level 2 x 6 min for warm-up/subjective.  Sit to stands 2 x 10 cues for forward lean, step ups 2 x 10 bil (1 riser, no UE support), forward T's x 10 bil (in front of plinth for safety), at counter - heel raises x 20, leg extensions 2 x 10 bil, hip abduction 2 x 10 bil.  Hip flexion 2 x 10 bil. With UE support.  In supine - bridges 2 x 10, LTR x 10 (small), SLR x 10 bil.  ? ? ?PATIENT EDUCATION: ?Education details: HEP update, pain neuroscience eduation  ?Person educated: Patient ?Education method: Explanation, Demonstration, Verbal cues, and Handouts ?Education comprehension: verbalized understanding and returned demonstration ? ? ?[place]: ?[id number] ? ?ASSESSMENT: ?Clinical Impression: ?ANNLEIGH KNUEPPEL reports more soreness in back today, but was able to participate with just brief seated rest breaks as needed.  She demonstrated good balance with standing balance exercises on airex, while challenged with eyes closed and head movements, only opened eyes 1x with larger sway.  She was able to maintain tandem stance on L for 30 sec, but reported rapid fatigue  with R tandem stance.   She would benefit from continued skilled therapy.  ? ? PT Short Term Goals   ? ?  ? PT SHORT TERM GOAL #1  ? Title Complete FOTO for lumbar spine   ? Status Achieved   ?  ? PT SHORT TERM GOAL #2

## 2022-02-13 NOTE — Therapy (Signed)
?OUTPATIENT PHYSICAL THERAPY TREATMENT NOTE ? ? ?Patient Name: Cheryl Finley ?MRN: [id number] ?DOB:[birth year], 76 y.o., female ?Today's Date: 02/14/2022 ? ?PCP: Stacie Glaze, DO ? ?REFERRING PROVIDER: Phylliss Bob, MD ? ? PT End of Session - 02/14/22 0802   ? ? Visit Number 5   ? Number of Visits 12   ? Date for PT Re-Evaluation 03/14/22   ? Authorization Type [employer] Medicare   ? Progress Note Due on Visit 10   ? PT Start Time 0802   ? PT Stop Time 0844   ? PT Time Calculation (min) 42 min   ? Activity Tolerance Patient tolerated treatment well   ? Behavior During Therapy [facility] for tasks assessed/performed   ? ?  ?  ? ?  ? ? ?Past Medical History:  ?Diagnosis Date  ? Age-related macular degeneration   ? Anemia   ? using iron daily  ? Arthritis   ? OA- knees, back, spine, hands   ? Asthma   ? Asthmatic bronchitis   ? Cancer [facility]) 2017  ? breast - pre-cancer  ? Complication of anesthesia   ? hard to wake   ? Diabetes mellitus without complication ([place]) 5885  ? Dr. Nicholes Stairs- endocrinologist- with [facility]  ? Family history of adverse reaction to anesthesia   ? Mother of pt. hard to wake up   ? Heart murmur   ? Moderate Aortic Stenosis 08/16/21 echo (Dr. Elonda Husky, [place])  ? History of hiatal hernia   ? Hyperlipemia   ? Hypertension   ? Hypothyroidism   ? Peripheral vascular disease ([place]) 1981  ? DVT- bilateral LE- postop- hysterectomy  ? Pneumonia   ? as a baby - double pneumonia - still has scarring in her lungs   ? Sleep apnea   ? CPAP- anytime she rests  ? ?Past Surgical History:  ?Procedure Laterality Date  ? ABDOMINAL HYSTERECTOMY    ? ANTERIOR LAT LUMBAR FUSION Left 02/25/2020  ? Procedure: LEFT-LATERAL INTERBODY FUSION LUMBAR 2 - LUMBAR 3 WITH INSTRUMENTATION AND ALLOGRAFT;  Surgeon: Phylliss Bob, MD;  Location: [place];  Service: Orthopedics;  Laterality: Left;  ? BREAST SURGERY Right 2017  ? pathology- precancer  ? CERVICAL SPINE SURGERY    ? CHOLECYSTECTOMY    ? DILATION AND CURETTAGE OF UTERUS    ? EYE SURGERY  Bilateral   ? cataracts removed.   ? hystersalpingogram    ? KNEE CARTILAGE SURGERY Bilateral   ? 2015, 2020  ? PARTIAL HYSTERECTOMY    ? SHOULDER ARTHROSCOPY WITH OPEN ROTATOR CUFF REPAIR Left   ? THYROIDECTOMY  unsure  ? TONSILLECTOMY    ? TRANSFORAMINAL LUMBAR INTERBODY FUSION (TLIF) WITH PEDICLE SCREW FIXATION 1 LEVEL Left 08/20/2019  ? Procedure: LEFT-SIDED LUMBAR 3-4 TRANSFORAMINAL LUMBAR INTERBODY FUSION WITH INSTRUMENTATION AND ALLOGRAFT;  Surgeon: Phylliss Bob, MD;  Location: [place];  Service: Orthopedics;  Laterality: Left;  ? TRANSFORAMINAL LUMBAR INTERBODY FUSION (TLIF) WITH PEDICLE SCREW FIXATION 2 LEVEL N/A 10/13/2021  ? Procedure: THORACIC TWELVE- LUMBAR ONE, LUMBAR ONE - LUMBAR TWO DECOMPRESSION AND FUSION WITH INSTRUMENTATION AND ALLOGRAFT;  Surgeon: Phylliss Bob, MD;  Location: [place];  Service: Orthopedics;  Laterality: N/A;  ? ?Patient Active Problem List  ? Diagnosis Date Noted  ? Radiculopathy 08/20/2019  ? ? ?REFERRING DIAG: s/p T-12-L2 Fusion  ? ?THERAPY DIAG:  ?Other low back pain ? ?Muscle weakness (generalized) ? ?Difficulty in walking, not elsewhere classified ? ?Unsteadiness on feet ? ?PERTINENT HISTORY: HTN, T2DM, aortic valve stenosis,  iron deficiency anemia, history of DVT, multiple back surgeries for stenosis ? ?PRECAUTIONS: none ? ?SUBJECTIVE: Pt denies "pain", just achy at the level where the intense pain was before surgery. ? ?PAIN:  ?Are you having pain? Yes: NPRS scale:  3/10 ?Pain location: low back ?Pain description: achy ? ?OBJECTIVE: (objective measures completed at initial evaluation unless otherwise dated) ? ?Sensation                                              01/31/2022  ?Light Touch Appears Intact   ?Additional Comments reports numbness R hip (L1 dermatome)   ?   ?Posture/Postural Control  ?Posture/Postural Control Postural limitations   ?Postural Limitations Rounded Shoulders;Forward head;Decreased lumbar lordosis   ?   ?ROM / Strength  ?AROM / PROM / Strength  AROM;Strength   ?   ?AROM  ?Overall AROM  Deficits   ?AROM Assessment Site Lumbar   ?Lumbar Flexion to mid shin, flexing at hips   ?Lumbar Extension limited 50%   ?Lumbar - Right Side Bend to above knee   reports R LB ache  ?Lumbar - Left Side Bend unable   due to R LB tightness  ?Lumbar - Right Rotation limited 50%   ?Lumbar - Left Rotation limited 50%   ?   ?Strength  ?Overall Strength Within functional limits for tasks performed   ?Overall Strength Comments tested in sitting bil LE all myotomes, L hip flexion 4+/5, all other 5/5   ?   ?Ambulation/Gait  ?Ambulation/Gait Yes   ?Ambulation/Gait Assistance 7: Independent   ?Ambulation Distance (Feet) 600 Feet   ?Assistive device None   ?Gait Pattern Within Functional Limits   ?   ?6 minute walk test results   ?Aerobic Endurance Distance Walked 600   ?Endurance additional comments dyspnea on exertion, needed sitting and standing rest breaks.   ?   ?Standardized Balance Assessment  ?Standardized Balance Assessment Five Times Sit to Stand   ?Five times sit to stand comments  17.5 seconds without UE assist, reports increased ache in LB   ? ? Observation/Other Assessments                                  02/02/2022  ?  Focus on Therapeutic Outcomes (FOTO)  54 (predicted 70)   ?     ?  Standardized Balance Assessment  ?  Standardized Balance Assessment Dynamic Gait Index   ?     ?  Dynamic Gait Index  ?  Level Surface Mild Impairment   ?  Change in Gait Speed Normal   ?  Gait with Horizontal Head Turns Normal   ?  Gait with Vertical Head Turns Mild Impairment   ?  Gait and Pivot Turn Normal   ?  Step Over Obstacle Normal   ?  Step Around Obstacles Normal   ?  Steps Mild Impairment   ?  Total Score 21   ?  DGI comment: moderate fall risk   ? ? ?TODAY'S TREATMENT:  ?02/14/22:  ?THERAPEUTIC EXERCISE: to improve strength and mobility.  Verbal and tactile cues throughout for technique. ?Rec bike - L 3 x 6 min or warm-up/subjective.   ?Standing: Counter squat: 2 x 10 ?Heel raises:  2 x 10 ?Standing 3-way hip SLR progressed to include looped  red TB at ankles x 10 each with B UE support on counter ?March with looped red TB at midfoot x 10 ?Supine: TrA + red TB alt bent knee fallout 2 x10 ?Bridge + red TB hip ABD 2 x 10 ?TrA + red TB march 2 x 10 ?THERAPEUTIC ACTIVITIES: Instructed pt in sitting <> sidelying with log rolling for in/out of bed with pt able to provide good return demonstration on treatment table.  ?SELF CARE: Provided education in proper posture and body mechanics for typical daily positioning and household chores to minimize strain on low back and neck. ? ? ? ?02/10/22 Therapeutic Exercise: to improve strength and mobility.  Verbal and tactile cues throughout for technique. ?Nustep level 6 x 6 min warm-up and subjective, seated flexion stretch with pball x 10, seated row and overhead press with weighted yellow ball 2 x 15.  Seated flexion stretch end of session to release muscle tension.  ? ?Neuromuscular Reeducation: to improve balance and stability. SBA for safety throughout.  In corner on airex: Eyes open feet apart x 30 sec  ?Eyes open feet apart with head nods x 10 ?Eyes open feet apart with head turns x 10 ?Eyes closed feet apart x 30 sec ?Eyes closed feet apart with head nods x 10 ?Eyes closed feet apart with head turns x 10  ?Level surface: Tandem stance 2 x 30 sec bil .  ?Start excursion pattern forward/back/lateral steps 5x bil.  ? ?Manual therapy: IASTM with foam roller to R gluts in sitting to decrease muscle spasm.  ? ?02/08/22  Therapeutic Exercise: to improve strength and mobility.  Verbal and tactile cues throughout for technique. ?Bike level 2 x 6 min for warm-up/subjective.  Sit to stands 2 x 10 cues for forward lean, step ups 2 x 10 bil (1 riser, no UE support), forward T's x 10 bil (in front of plinth for safety), at counter - heel raises x 20, leg extensions 2 x 10 bil, hip abduction 2 x 10 bil.  Hip flexion 2 x 10 bil. With UE support.  In supine - bridges 2 x  10, LTR x 10 (small), SLR x 10 bil.  ? ? ?PATIENT EDUCATION: ?Education details: HEP progression - red TB added to HEP for standing hip exercises and bridges and posture and body mechanics for typical daily

## 2022-02-14 ENCOUNTER — Ambulatory Visit: Payer: Medicare Other | Admitting: Physical Therapy

## 2022-02-14 ENCOUNTER — Encounter: Payer: Self-pay | Admitting: Physical Therapy

## 2022-02-14 DIAGNOSIS — M5459 Other low back pain: Secondary | ICD-10-CM | POA: Diagnosis not present

## 2022-02-14 DIAGNOSIS — R2681 Unsteadiness on feet: Secondary | ICD-10-CM

## 2022-02-14 DIAGNOSIS — R262 Difficulty in walking, not elsewhere classified: Secondary | ICD-10-CM

## 2022-02-14 DIAGNOSIS — M6281 Muscle weakness (generalized): Secondary | ICD-10-CM

## 2022-02-17 ENCOUNTER — Ambulatory Visit: Payer: Medicare Other

## 2022-02-17 DIAGNOSIS — R262 Difficulty in walking, not elsewhere classified: Secondary | ICD-10-CM

## 2022-02-17 DIAGNOSIS — M5459 Other low back pain: Secondary | ICD-10-CM

## 2022-02-17 DIAGNOSIS — M6281 Muscle weakness (generalized): Secondary | ICD-10-CM

## 2022-02-17 DIAGNOSIS — R2681 Unsteadiness on feet: Secondary | ICD-10-CM

## 2022-02-17 NOTE — Therapy (Signed)
OUTPATIENT PHYSICAL THERAPY TREATMENT NOTE   Patient Name: Cheryl Finley MRN: [id number] DOB:November 07, 1945, 76 y.o., female Today's Date: 02/17/2022  PCP: Stacie Glaze, DO  REFERRING PROVIDER: Phylliss Bob, MD   PT End of Session - 02/17/22 [phone]     Visit Number 6    Number of Visits 12    Date for PT Re-Evaluation 03/14/22    Authorization Type [employer] Medicare    Progress Note Due on Visit 10    PT Start Time 0848    PT Stop Time 0934    PT Time Calculation (min) 46 min    Activity Tolerance Patient tolerated treatment well    Behavior During Therapy [facility] for tasks assessed/performed              Past Medical History:  Diagnosis Date   Age-related macular degeneration    Anemia    using iron daily   Arthritis    OA- knees, back, spine, hands    Asthma    Asthmatic bronchitis    Cancer ([place]) 2017   breast - pre-cancer   Complication of anesthesia    hard to wake    Diabetes mellitus without complication [facility]) 1191   Dr. Nicholes Stairs- endocrinologist- with [facility]   Family history of adverse reaction to anesthesia    Mother of pt. hard to wake up    Heart murmur    Moderate Aortic Stenosis 08/16/21 echo (Dr. Elonda Husky, [place])   History of hiatal hernia    Hyperlipemia    Hypertension    Hypothyroidism    Peripheral vascular disease ([place]) 1981   DVT- bilateral LE- postop- hysterectomy   Pneumonia    as a baby - double pneumonia - still has scarring in her lungs    Sleep apnea    CPAP- anytime she rests   Past Surgical History:  Procedure Laterality Date   ABDOMINAL HYSTERECTOMY     ANTERIOR LAT LUMBAR FUSION Left 02/25/2020   Procedure: LEFT-LATERAL INTERBODY FUSION LUMBAR 2 - LUMBAR 3 WITH INSTRUMENTATION AND ALLOGRAFT;  Surgeon: Phylliss Bob, MD;  Location: [place];  Service: Orthopedics;  Laterality: Left;   BREAST SURGERY Right 2017   pathology- precancer   CERVICAL SPINE SURGERY     CHOLECYSTECTOMY     DILATION AND CURETTAGE OF UTERUS     EYE  SURGERY Bilateral    cataracts removed.    hystersalpingogram     KNEE CARTILAGE SURGERY Bilateral    2015, 2020   PARTIAL HYSTERECTOMY     SHOULDER ARTHROSCOPY WITH OPEN ROTATOR CUFF REPAIR Left    THYROIDECTOMY  unsure   TONSILLECTOMY     TRANSFORAMINAL LUMBAR INTERBODY FUSION (TLIF) WITH PEDICLE SCREW FIXATION 1 LEVEL Left 08/20/2019   Procedure: LEFT-SIDED LUMBAR 3-4 TRANSFORAMINAL LUMBAR INTERBODY FUSION WITH INSTRUMENTATION AND ALLOGRAFT;  Surgeon: Phylliss Bob, MD;  Location: [place];  Service: Orthopedics;  Laterality: Left;   TRANSFORAMINAL LUMBAR INTERBODY FUSION (TLIF) WITH PEDICLE SCREW FIXATION 2 LEVEL N/A 10/13/2021   Procedure: THORACIC TWELVE- LUMBAR ONE, LUMBAR ONE - LUMBAR TWO DECOMPRESSION AND FUSION WITH INSTRUMENTATION AND ALLOGRAFT;  Surgeon: Phylliss Bob, MD;  Location: [place];  Service: Orthopedics;  Laterality: N/A;   Patient Active Problem List   Diagnosis Date Noted   Radiculopathy 08/20/2019    REFERRING DIAG: s/p T-12-L2 Fusion   THERAPY DIAG:  Other low back pain  Muscle weakness (generalized)  Difficulty in walking, not elsewhere classified  Unsteadiness on feet  PERTINENT HISTORY: HTN, T2DM, aortic valve  stenosis, iron deficiency anemia, history of DVT, multiple back surgeries for stenosis  PRECAUTIONS: none  SUBJECTIVE: Pt reports no pain today just really stiff.  PAIN:  Are you having pain? Yes: NPRS scale:  0/10 Pain location: N/A Pain description: N/A  OBJECTIVE: (objective measures completed at initial evaluation unless otherwise dated)  Sensation                                              01/31/2022  Light Touch Appears Intact   Additional Comments reports numbness R hip (L1 dermatome)      Posture/Postural Control  Posture/Postural Control Postural limitations   Postural Limitations Rounded Shoulders;Forward head;Decreased lumbar lordosis      ROM / Strength  AROM / PROM / Strength AROM;Strength      AROM  Overall AROM   Deficits   AROM Assessment Site Lumbar   Lumbar Flexion to mid shin, flexing at hips   Lumbar Extension limited 50%   Lumbar - Right Side Bend to above knee   reports R LB ache  Lumbar - Left Side Bend unable   due to R LB tightness  Lumbar - Right Rotation limited 50%   Lumbar - Left Rotation limited 50%      Strength  Overall Strength Within functional limits for tasks performed   Overall Strength Comments tested in sitting bil LE all myotomes, L hip flexion 4+/5, all other 5/5      Ambulation/Gait  Ambulation/Gait Yes   Ambulation/Gait Assistance 7: Independent   Ambulation Distance (Feet) 600 Feet   Assistive device None   Gait Pattern Within Functional Limits      6 minute walk test results   Aerobic Endurance Distance Walked 600   Endurance additional comments dyspnea on exertion, needed sitting and standing rest breaks.      Standardized Balance Assessment  Standardized Balance Assessment Five Times Sit to Stand   Five times sit to stand comments  17.5 seconds without UE assist, reports increased ache in LB     Observation/Other Assessments                                  02/02/2022    Focus on Therapeutic Outcomes (FOTO)  54 (predicted 56)          Standardized Balance Assessment    Standardized Balance Assessment Dynamic Gait Index          Dynamic Gait Index    Level Surface Mild Impairment     Change in Gait Speed Normal     Gait with Horizontal Head Turns Normal     Gait with Vertical Head Turns Mild Impairment     Gait and Pivot Turn Normal     Step Over Obstacle Normal     Step Around Obstacles Normal     Steps Mild Impairment     Total Score 21     DGI comment: moderate fall risk     TODAY'S TREATMENT:  02/17/22 Therapeutic Exercise: Recumbent bike: L3x24min Sidesteps (3x down and back along counter) monster walks with red TB (2x down and back along counter) Squats with red TB at knees with UE support 10x Lat pulls and standing rows 20# 2x10 Seated  marches and LAQ with 2# weights 2x10 Hip throws in sitting with yellow  weighted ball bil 2x10  Physical Performance Test:  5x STS -  10.75 sec w/o UE support, no increase in LBP  Manual Therapy:  STM to R thoracolumbar paraspinals and QL in sitting  02/14/22:  THERAPEUTIC EXERCISE: to improve strength and mobility.  Verbal and tactile cues throughout for technique. Rec bike - L 3 x 6 min or warm-up/subjective.   Standing: Counter squat: 2 x 10 Heel raises: 2 x 10 Standing 3-way hip SLR progressed to include looped red TB at ankles x 10 each with B UE support on counter March with looped red TB at midfoot x 10 Supine: TrA + red TB alt bent knee fallout 2 x10 Bridge + red TB hip ABD 2 x 10 TrA + red TB march 2 x 10 THERAPEUTIC ACTIVITIES: Instructed pt in sitting <> sidelying with log rolling for in/out of bed with pt able to provide good return demonstration on treatment table.  SELF CARE: Provided education in proper posture and body mechanics for typical daily positioning and household chores to minimize strain on low back and neck.    02/10/22 Therapeutic Exercise: to improve strength and mobility.  Verbal and tactile cues throughout for technique. Nustep level 6 x 6 min warm-up and subjective, seated flexion stretch with pball x 10, seated row and overhead press with weighted yellow ball 2 x 15.  Seated flexion stretch end of session to release muscle tension.   Neuromuscular Reeducation: to improve balance and stability. SBA for safety throughout.  In corner on airex: Eyes open feet apart x 30 sec  Eyes open feet apart with head nods x 10 Eyes open feet apart with head turns x 10 Eyes closed feet apart x 30 sec Eyes closed feet apart with head nods x 10 Eyes closed feet apart with head turns x 10  Level surface: Tandem stance 2 x 30 sec bil .  Start excursion pattern forward/back/lateral steps 5x bil.   Manual therapy: IASTM with foam roller to R gluts in sitting to decrease  muscle spasm.   02/08/22  Therapeutic Exercise: to improve strength and mobility.  Verbal and tactile cues throughout for technique. Bike level 2 x 6 min for warm-up/subjective.  Sit to stands 2 x 10 cues for forward lean, step ups 2 x 10 bil (1 riser, no UE support), forward T's x 10 bil (in front of plinth for safety), at counter - heel raises x 20, leg extensions 2 x 10 bil, hip abduction 2 x 10 bil.  Hip flexion 2 x 10 bil. With UE support.  In supine - bridges 2 x 10, LTR x 10 (small), SLR x 10 bil.    PATIENT EDUCATION: Education details: HEP progression - red TB added to HEP for standing hip exercises and bridges and posture and body mechanics for typical daily postioning, mobility and household task Person educated: Patient Education method: Consulting civil engineer, Demonstration, Verbal cues, and Handouts Education comprehension: verbalized understanding and returned demonstration   HOME EXERCISE PROGRAM: [id number]  ASSESSMENT: CLINICAL IMPRESSION: Pt has met LTG #5 today for the 5x STS test, specifics are under today's treatment section. She was able to complete all interventions w/o any increased pain but did note achiness/soreness along the R QL throughout session. Worked on hip, core, and postural strengthening all to achieve better postural alignment and stability with daily activities. Post exercise we did some MT to reduce soreness and tightness along the R side of the LB.  PT Short Term Goals  PT SHORT TERM GOAL #1   Title Complete FOTO for lumbar spine    Status Achieved      PT SHORT TERM GOAL #2   Title Complete DGI to assess balance and safety    Status Achieved              PT Long Term Goals - 02/08/22 0806       PT LONG TERM GOAL #1   Title Ind with progressive HEP to improve outcomes/carryover    Time 6    Period Weeks    Status On-going    Target Date 03/14/22      PT LONG TERM GOAL #2   Title Pt. will demonstrate improved endurance by being able to  walk for 15 minutes without pain or dyspnea    Baseline 600'/4 min    Time 6    Period Weeks    Status On-going    Target Date 03/14/22      PT LONG TERM GOAL #3   Title Pt. will demonstrate decreased fall risk by scoring at least 19/24 on DGI    Time 6    Period Weeks    Status On-going    Target Date 03/14/22      PT LONG TERM GOAL #4   Title Pt. will be able to complete household ADLs without increased LBP/discomfort with good form.    Baseline limitations with doing household chores, picking items off floor    Time 6    Period Weeks    Status On-going    Target Date 03/14/22      PT LONG TERM GOAL #5   Title Pt. will demonstrate improved functional LE strength by completing 5x STS <14 seconds    Baseline 17.5 sec, reports increased ache in low back    Time 6    Period Weeks    Status Achieved - 02/17/22- see under today's treatment for specifics    Target Date 03/14/22      PT LONG TERM GOAL #6   Title Patient will be confident transitioning to community based exercise program (silver sneakers/water aerobics) to maintain progress.    Time 6    Period Weeks    Status On-going    Target Date 03/14/22            PLAN:  PT Frequency 2x / week   PT Duration 6 weeks   PT Treatment/Interventions ADLs/Self Care Home Management;Cryotherapy;Electrical Stimulation;Moist Heat;Gait training;Stair training;Functional mobility training;Therapeutic activities;Therapeutic exercise;Balance training;Neuromuscular re-education;Patient/family education;Manual techniques   PT Next Visit Plan Continue lumbar strengthening.             Patient will benefit from skilled therapeutic intervention in order to improve the following deficits and impairments:  Decreased activity tolerance, Decreased endurance, Decreased range of motion, Decreased strength, Impaired sensation, Pain, Improper body mechanics, Decreased balance, Decreased mobility, Difficulty walking, Increased muscle spasms,  Impaired flexibility, Postural dysfunction   Artist Pais, PTA 02/17/2022, 9:57 AM

## 2022-02-21 ENCOUNTER — Ambulatory Visit: Payer: Medicare Other

## 2022-02-21 DIAGNOSIS — M6281 Muscle weakness (generalized): Secondary | ICD-10-CM

## 2022-02-21 DIAGNOSIS — M5459 Other low back pain: Secondary | ICD-10-CM | POA: Diagnosis not present

## 2022-02-21 DIAGNOSIS — R2681 Unsteadiness on feet: Secondary | ICD-10-CM

## 2022-02-21 DIAGNOSIS — R262 Difficulty in walking, not elsewhere classified: Secondary | ICD-10-CM

## 2022-02-21 NOTE — Therapy (Signed)
OUTPATIENT PHYSICAL THERAPY TREATMENT NOTE Rationale for Evaluation and Treatment Rehabilitation   Patient Name: Cheryl Finley MRN: [id number] DOB:05-May-1946, 76 y.o., female Today's Date: 02/21/2022  PCP: Stacie Glaze, DO  REFERRING PROVIDER: Phylliss Bob, MD   PT End of Session - 02/21/22 0930     Visit Number 7    Number of Visits 12    Date for PT Re-Evaluation 03/14/22    Authorization Type [employer]    Progress Note Due on Visit 10    PT Start Time 0850   pt late   PT Stop Time 0939    PT Time Calculation (min) 49 min    Activity Tolerance Patient tolerated treatment well    Behavior During Therapy [facility] for tasks assessed/performed               Past Medical History:  Diagnosis Date   Age-related macular degeneration    Anemia    using iron daily   Arthritis    OA- knees, back, spine, hands    Asthma    Asthmatic bronchitis    Cancer ([place]) 2017   breast - pre-cancer   Complication of anesthesia    hard to wake    Diabetes mellitus without complication [facility]) 7341   Dr. Nicholes Stairs- endocrinologist- with [facility]   Family history of adverse reaction to anesthesia    Mother of pt. hard to wake up    Heart murmur    Moderate Aortic Stenosis 08/16/21 echo (Dr. Elonda Husky, [place])   History of hiatal hernia    Hyperlipemia    Hypertension    Hypothyroidism    Peripheral vascular disease ([place]) 1981   DVT- bilateral LE- postop- hysterectomy   Pneumonia    as a baby - double pneumonia - still has scarring in her lungs    Sleep apnea    CPAP- anytime she rests   Past Surgical History:  Procedure Laterality Date   ABDOMINAL HYSTERECTOMY     ANTERIOR LAT LUMBAR FUSION Left 02/25/2020   Procedure: LEFT-LATERAL INTERBODY FUSION LUMBAR 2 - LUMBAR 3 WITH INSTRUMENTATION AND ALLOGRAFT;  Surgeon: Phylliss Bob, MD;  Location: [place];  Service: Orthopedics;  Laterality: Left;   BREAST SURGERY Right 2017   pathology- precancer   CERVICAL SPINE SURGERY      CHOLECYSTECTOMY     DILATION AND CURETTAGE OF UTERUS     EYE SURGERY Bilateral    cataracts removed.    hystersalpingogram     KNEE CARTILAGE SURGERY Bilateral    2015, 2020   PARTIAL HYSTERECTOMY     SHOULDER ARTHROSCOPY WITH OPEN ROTATOR CUFF REPAIR Left    THYROIDECTOMY  unsure   TONSILLECTOMY     TRANSFORAMINAL LUMBAR INTERBODY FUSION (TLIF) WITH PEDICLE SCREW FIXATION 1 LEVEL Left 08/20/2019   Procedure: LEFT-SIDED LUMBAR 3-4 TRANSFORAMINAL LUMBAR INTERBODY FUSION WITH INSTRUMENTATION AND ALLOGRAFT;  Surgeon: Phylliss Bob, MD;  Location: [place];  Service: Orthopedics;  Laterality: Left;   TRANSFORAMINAL LUMBAR INTERBODY FUSION (TLIF) WITH PEDICLE SCREW FIXATION 2 LEVEL N/A 10/13/2021   Procedure: THORACIC TWELVE- LUMBAR ONE, LUMBAR ONE - LUMBAR TWO DECOMPRESSION AND FUSION WITH INSTRUMENTATION AND ALLOGRAFT;  Surgeon: Phylliss Bob, MD;  Location: [place];  Service: Orthopedics;  Laterality: N/A;   Patient Active Problem List   Diagnosis Date Noted   Radiculopathy 08/20/2019    REFERRING DIAG: s/p T-12-L2 Fusion   THERAPY DIAG:  Other low back pain  Muscle weakness (generalized)  Difficulty in walking, not elsewhere classified  Unsteadiness on feet  PERTINENT HISTORY: HTN, T2DM, aortic valve stenosis, iron deficiency anemia, history of DVT, multiple back surgeries for stenosis  PRECAUTIONS: none  SUBJECTIVE: Pt reports that she hurt over the weekend just from her muscles being sore from the session  PAIN:  Are you having pain? Yes: NPRS scale:  4/10 Pain location: hips and R low back Pain description: sore  OBJECTIVE: (objective measures completed at initial evaluation unless otherwise dated)  Sensation                                              01/31/2022  Light Touch Appears Intact   Additional Comments reports numbness R hip (L1 dermatome)      Posture/Postural Control  Posture/Postural Control Postural limitations   Postural Limitations Rounded  Shoulders;Forward head;Decreased lumbar lordosis      ROM / Strength  AROM / PROM / Strength AROM;Strength      AROM  Overall AROM  Deficits   AROM Assessment Site Lumbar   Lumbar Flexion to mid shin, flexing at hips   Lumbar Extension limited 50%   Lumbar - Right Side Bend to above knee   reports R LB ache  Lumbar - Left Side Bend unable   due to R LB tightness  Lumbar - Right Rotation limited 50%   Lumbar - Left Rotation limited 50%      Strength  Overall Strength Within functional limits for tasks performed   Overall Strength Comments tested in sitting bil LE all myotomes, L hip flexion 4+/5, all other 5/5      Ambulation/Gait  Ambulation/Gait Yes   Ambulation/Gait Assistance 7: Independent   Ambulation Distance (Feet) 600 Feet   Assistive device None   Gait Pattern Within Functional Limits      6 minute walk test results   Aerobic Endurance Distance Walked 600   Endurance additional comments dyspnea on exertion, needed sitting and standing rest breaks.      Standardized Balance Assessment  Standardized Balance Assessment Five Times Sit to Stand   Five times sit to stand comments  17.5 seconds without UE assist, reports increased ache in LB     Observation/Other Assessments                                  02/02/2022    Focus on Therapeutic Outcomes (FOTO)  54 (predicted 56)          Standardized Balance Assessment    Standardized Balance Assessment Dynamic Gait Index          Dynamic Gait Index    Level Surface Mild Impairment     Change in Gait Speed Normal     Gait with Horizontal Head Turns Normal     Gait with Vertical Head Turns Mild Impairment     Gait and Pivot Turn Normal     Step Over Obstacle Normal     Step Around Obstacles Normal     Steps Mild Impairment     Total Score 21     DGI comment: moderate fall risk     TODAY'S TREATMENT:  02/21/22 Therapeutic Exercise: Nu Step L4x32mn  Gait Training: 4x170 ft to warm up  Manual Therapy: STM to bil  quads, R ITB, R lumbar paraspinals  MHP x 10 to low  back post session  02/17/22 Therapeutic Exercise: Recumbent bike: L3x74mn Sidesteps (3x down and back along counter) monster walks with red TB (2x down and back along counter) Squats with red TB at knees with UE support 10x Lat pulls and standing rows 20# 2x10 Seated marches and LAQ with 2# weights 2x10 Hip throws in sitting with yellow weighted ball bil 2x10  Physical Performance Test:  5x STS -  10.75 sec w/o UE support, no increase in LBP  Manual Therapy:  STM to R thoracolumbar paraspinals and QL in sitting  02/14/22:  THERAPEUTIC EXERCISE: to improve strength and mobility.  Verbal and tactile cues throughout for technique. Rec bike - L 3 x 6 min or warm-up/subjective.   Standing: Counter squat: 2 x 10 Heel raises: 2 x 10 Standing 3-way hip SLR progressed to include looped red TB at ankles x 10 each with B UE support on counter March with looped red TB at midfoot x 10 Supine: TrA + red TB alt bent knee fallout 2 x10 Bridge + red TB hip ABD 2 x 10 TrA + red TB march 2 x 10 THERAPEUTIC ACTIVITIES: Instructed pt in sitting <> sidelying with log rolling for in/out of bed with pt able to provide good return demonstration on treatment table.  SELF CARE: Provided education in proper posture and body mechanics for typical daily positioning and household chores to minimize strain on low back and neck.       PATIENT EDUCATION: Education details: HEP progression - red TB added to HEP for standing hip exercises and bridges and posture and body mechanics for typical daily postioning, mobility and household task Person educated: Patient Education method: EConsulting civil engineer Demonstration, Verbal cues, and Handouts Education comprehension: verbalized understanding and returned demonstration   HOME EXERCISE PROGRAM: [id number] ASSESSMENT: CLINICAL IMPRESSION: Pt was sore coming in today from Friday's session. Session was focused on manual  to decrease muscle soreness and spasm. After manual she noted less muscle soreness and we did some stretching for the low back to further promote muscle relaxation. Ended session with moist heat to low back to increase muscle tissue extensibility.   PT Short Term Goals       PT SHORT TERM GOAL #1   Title Complete FOTO for lumbar spine    Status Achieved      PT SHORT TERM GOAL #2   Title Complete DGI to assess balance and safety    Status Achieved              PT Long Term Goals - 02/08/22 0806       PT LONG TERM GOAL #1   Title Ind with progressive HEP to improve outcomes/carryover    Time 6    Period Weeks    Status On-going    Target Date 03/14/22      PT LONG TERM GOAL #2   Title Pt. will demonstrate improved endurance by being able to walk for 15 minutes without pain or dyspnea    Baseline 600'/4 min    Time 6    Period Weeks    Status On-going    Target Date 03/14/22      PT LONG TERM GOAL #3   Title Pt. will demonstrate decreased fall risk by scoring at least 19/24 on DGI    Time 6    Period Weeks    Status On-going    Target Date 03/14/22      PT LONG TERM GOAL #4   Title Pt.  will be able to complete household ADLs without increased LBP/discomfort with good form.    Baseline limitations with doing household chores, picking items off floor    Time 6    Period Weeks    Status On-going    Target Date 03/14/22      PT LONG TERM GOAL #5   Title Pt. will demonstrate improved functional LE strength by completing 5x STS <14 seconds    Baseline 17.5 sec, reports increased ache in low back    Time 6    Period Weeks    Status Achieved - 02/17/22- see under today's treatment for specifics    Target Date 03/14/22      PT LONG TERM GOAL #6   Title Patient will be confident transitioning to community based exercise program (silver sneakers/water aerobics) to maintain progress.    Time 6    Period Weeks    Status On-going    Target Date 03/14/22             PLAN:  PT Frequency 2x / week   PT Duration 6 weeks   PT Treatment/Interventions ADLs/Self Care Home Management;Cryotherapy;Electrical Stimulation;Moist Heat;Gait training;Stair training;Functional mobility training;Therapeutic activities;Therapeutic exercise;Balance training;Neuromuscular re-education;Patient/family education;Manual techniques   PT Next Visit Plan Continue to progress lumbar strengthening.             Patient will benefit from skilled therapeutic intervention in order to improve the following deficits and impairments:  Decreased activity tolerance, Decreased endurance, Decreased range of motion, Decreased strength, Impaired sensation, Pain, Improper body mechanics, Decreased balance, Decreased mobility, Difficulty walking, Increased muscle spasms, Impaired flexibility, Postural dysfunction   Hila Bolding L Amoria Mclees, PTA 02/21/2022, 9:30 AM

## 2022-02-22 NOTE — Therapy (Incomplete)
OUTPATIENT PHYSICAL THERAPY TREATMENT NOTE   Patient Name: Cheryl Finley MRN: [id number] DOB:1946-07-05, 76 y.o., female Today's Date: 02/22/2022  PCP: Stacie Glaze, DO  REFERRING PROVIDER: Phylliss Bob, MD       Past Medical History:  Diagnosis Date   Age-related macular degeneration    Anemia    using iron daily   Arthritis    OA- knees, back, spine, hands    Asthma    Asthmatic bronchitis    Cancer ([place]) 2017   breast - pre-cancer   Complication of anesthesia    hard to wake    Diabetes mellitus without complication [facility]) 1751   Dr. Nicholes Stairs- endocrinologist- with [facility]   Family history of adverse reaction to anesthesia    Mother of pt. hard to wake up    Heart murmur    Moderate Aortic Stenosis 08/16/21 echo (Dr. Elonda Husky, [place])   History of hiatal hernia    Hyperlipemia    Hypertension    Hypothyroidism    Peripheral vascular disease ([place]) 1981   DVT- bilateral LE- postop- hysterectomy   Pneumonia    as a baby - double pneumonia - still has scarring in her lungs    Sleep apnea    CPAP- anytime she rests   Past Surgical History:  Procedure Laterality Date   ABDOMINAL HYSTERECTOMY     ANTERIOR LAT LUMBAR FUSION Left 02/25/2020   Procedure: LEFT-LATERAL INTERBODY FUSION LUMBAR 2 - LUMBAR 3 WITH INSTRUMENTATION AND ALLOGRAFT;  Surgeon: Phylliss Bob, MD;  Location: [place];  Service: Orthopedics;  Laterality: Left;   BREAST SURGERY Right 2017   pathology- precancer   CERVICAL SPINE SURGERY     CHOLECYSTECTOMY     DILATION AND CURETTAGE OF UTERUS     EYE SURGERY Bilateral    cataracts removed.    hystersalpingogram     KNEE CARTILAGE SURGERY Bilateral    2015, 2020   PARTIAL HYSTERECTOMY     SHOULDER ARTHROSCOPY WITH OPEN ROTATOR CUFF REPAIR Left    THYROIDECTOMY  unsure   TONSILLECTOMY     TRANSFORAMINAL LUMBAR INTERBODY FUSION (TLIF) WITH PEDICLE SCREW FIXATION 1 LEVEL Left 08/20/2019   Procedure: LEFT-SIDED LUMBAR 3-4 TRANSFORAMINAL LUMBAR  INTERBODY FUSION WITH INSTRUMENTATION AND ALLOGRAFT;  Surgeon: Phylliss Bob, MD;  Location: [place];  Service: Orthopedics;  Laterality: Left;   TRANSFORAMINAL LUMBAR INTERBODY FUSION (TLIF) WITH PEDICLE SCREW FIXATION 2 LEVEL N/A 10/13/2021   Procedure: THORACIC TWELVE- LUMBAR ONE, LUMBAR ONE - LUMBAR TWO DECOMPRESSION AND FUSION WITH INSTRUMENTATION AND ALLOGRAFT;  Surgeon: Phylliss Bob, MD;  Location: [place];  Service: Orthopedics;  Laterality: N/A;   Patient Active Problem List   Diagnosis Date Noted   Radiculopathy 08/20/2019    REFERRING DIAG: s/p T-12-L2 Fusion   Rationale for Evaluation and Treatment:  Rehabilitation    THERAPY DIAG:  No diagnosis found.  PERTINENT HISTORY: HTN, T2DM, aortic valve stenosis, iron deficiency anemia, history of DVT, multiple back surgeries for stenosis  PRECAUTIONS: none  SUBJECTIVE: *** Pt reports that she hurt over the weekend just from her muscles being sore from the session  PAIN: *** Are you having pain? Yes: NPRS scale:  4/10 Pain location: hips and R low back Pain description: sore  OBJECTIVE: (objective measures completed at initial evaluation unless otherwise dated)  Sensation  01/31/2022  Light Touch Appears Intact   Additional Comments reports numbness R hip (L1 dermatome)      Posture/Postural Control  Posture/Postural Control Postural limitations   Postural Limitations Rounded Shoulders;Forward head;Decreased lumbar lordosis      ROM / Strength  AROM / PROM / Strength AROM;Strength      AROM  Overall AROM  Deficits   AROM Assessment Site Lumbar   Lumbar Flexion to mid shin, flexing at hips   Lumbar Extension limited 50%   Lumbar - Right Side Bend to above knee   reports R LB ache  Lumbar - Left Side Bend unable   due to R LB tightness  Lumbar - Right Rotation limited 50%   Lumbar - Left Rotation limited 50%      Strength  Overall Strength Within functional limits for tasks  performed   Overall Strength Comments tested in sitting bil LE all myotomes, L hip flexion 4+/5, all other 5/5      Ambulation/Gait  Ambulation/Gait Yes   Ambulation/Gait Assistance 7: Independent   Ambulation Distance (Feet) 600 Feet   Assistive device None   Gait Pattern Within Functional Limits      6 minute walk test results   Aerobic Endurance Distance Walked 600   Endurance additional comments dyspnea on exertion, needed sitting and standing rest breaks.      Standardized Balance Assessment  Standardized Balance Assessment Five Times Sit to Stand   Five times sit to stand comments  17.5 seconds without UE assist, reports increased ache in LB     Observation/Other Assessments                                  02/02/2022    Focus on Therapeutic Outcomes (FOTO)  54 (predicted 56)          Standardized Balance Assessment    Standardized Balance Assessment Dynamic Gait Index          Dynamic Gait Index    Level Surface Mild Impairment     Change in Gait Speed Normal     Gait with Horizontal Head Turns Normal     Gait with Vertical Head Turns Mild Impairment     Gait and Pivot Turn Normal     Step Over Obstacle Normal     Step Around Obstacles Normal     Steps Mild Impairment     Total Score 21     DGI comment: moderate fall risk     TODAY'S TREATMENT:  02/23/22 Nu Step L4x33mn  02/21/22 Therapeutic Exercise: Nu Step L4x554m  Gait Training: 4x170 ft to warm up  Manual Therapy: STM to bil quads, R ITB, R lumbar paraspinals  MHP x 10 to low back post session  02/17/22 Therapeutic Exercise: Recumbent bike: L3x6m77mSidesteps (3x down and back along counter) monster walks with red TB (2x down and back along counter) Squats with red TB at knees with UE support 10x Lat pulls and standing rows 20# 2x10 Seated marches and LAQ with 2# weights 2x10 Hip throws in sitting with yellow weighted ball bil 2x10  Physical Performance Test:  5x STS -  10.75 sec w/o UE support, no  increase in LBP  Manual Therapy:  STM to R thoracolumbar paraspinals and QL in sitting    PATIENT EDUCATION: Education details: ***  Person educated: Patient Education method: ExpConsulting civil engineeremMedia plannererbal cues, and Handouts Education comprehension: verbalized understanding and  returned demonstration   HOME EXERCISE PROGRAM: [id number]  ASSESSMENT: CLINICAL IMPRESSION: ***  Pt was sore coming in today from Friday's session. Session was focused on manual to decrease muscle soreness and spasm. After manual she noted less muscle soreness and we did some stretching for the low back to further promote muscle relaxation. Ended session with moist heat to low back to increase muscle tissue extensibility.   PT Short Term Goals       PT SHORT TERM GOAL #1   Title Complete FOTO for lumbar spine    Status Achieved      PT SHORT TERM GOAL #2   Title Complete DGI to assess balance and safety    Status Achieved              PT Long Term Goals - 02/08/22 0806       PT LONG TERM GOAL #1   Title Ind with progressive HEP to improve outcomes/carryover    Time 6    Period Weeks    Status On-going    Target Date 03/14/22      PT LONG TERM GOAL #2   Title Pt. will demonstrate improved endurance by being able to walk for 15 minutes without pain or dyspnea    Baseline 600'/4 min    Time 6    Period Weeks    Status On-going    Target Date 03/14/22      PT LONG TERM GOAL #3   Title Pt. will demonstrate decreased fall risk by scoring at least 19/24 on DGI    Time 6    Period Weeks    Status On-going    Target Date 03/14/22      PT LONG TERM GOAL #4   Title Pt. will be able to complete household ADLs without increased LBP/discomfort with good form.    Baseline limitations with doing household chores, picking items off floor    Time 6    Period Weeks    Status On-going    Target Date 03/14/22      PT LONG TERM GOAL #5   Title Pt. will demonstrate improved functional LE  strength by completing 5x STS <14 seconds    Baseline 17.5 sec, reports increased ache in low back    Time 6    Period Weeks    Status Achieved - 02/17/22- see under today's treatment for specifics    Target Date 03/14/22      PT LONG TERM GOAL #6   Title Patient will be confident transitioning to community based exercise program (silver sneakers/water aerobics) to maintain progress.    Time 6    Period Weeks    Status On-going    Target Date 03/14/22            PLAN:  PT Frequency 2x / week   PT Duration 6 weeks   PT Treatment/Interventions ADLs/Self Care Home Management;Cryotherapy;Electrical Stimulation;Moist Heat;Gait training;Stair training;Functional mobility training;Therapeutic activities;Therapeutic exercise;Balance training;Neuromuscular re-education;Patient/family education;Manual techniques   PT Next Visit Plan *** Continue to progress lumbar strengthening.             Patient will benefit from skilled therapeutic intervention in order to improve the following deficits and impairments:  Decreased activity tolerance, Decreased endurance, Decreased range of motion, Decreased strength, Impaired sensation, Pain, Improper body mechanics, Decreased balance, Decreased mobility, Difficulty walking, Increased muscle spasms, Impaired flexibility, Postural dysfunction   Renard Caperton, PT 02/22/2022, 3:13 PM

## 2022-02-23 ENCOUNTER — Ambulatory Visit: Payer: Medicare Other | Admitting: Physical Therapy

## 2022-03-01 ENCOUNTER — Encounter: Payer: Self-pay | Admitting: Physical Therapy

## 2022-03-01 ENCOUNTER — Ambulatory Visit: Payer: Medicare Other | Admitting: Physical Therapy

## 2022-03-01 DIAGNOSIS — M5459 Other low back pain: Secondary | ICD-10-CM | POA: Diagnosis not present

## 2022-03-01 DIAGNOSIS — M6281 Muscle weakness (generalized): Secondary | ICD-10-CM

## 2022-03-01 DIAGNOSIS — R2681 Unsteadiness on feet: Secondary | ICD-10-CM

## 2022-03-01 DIAGNOSIS — R262 Difficulty in walking, not elsewhere classified: Secondary | ICD-10-CM

## 2022-03-01 NOTE — Therapy (Signed)
OUTPATIENT PHYSICAL THERAPY TREATMENT NOTE Rationale for Evaluation and Treatment Rehabilitation   Patient Name: Cheryl Finley MRN: [id number] DOB:[birth year], 76 y.o., female Today's Date: 03/01/2022  PCP: Stacie Glaze, DO  REFERRING PROVIDER: Phylliss Bob, MD   PT End of Session - 03/01/22 0804     Visit Number 8    Number of Visits 12    Date for PT Re-Evaluation 03/14/22    Authorization Type [employer] Medicare    Progress Note Due on Visit 10    PT Start Time 0802    PT Stop Time 0845    PT Time Calculation (min) 43 min    Activity Tolerance Patient tolerated treatment well    Behavior During Therapy [facility] for tasks assessed/performed               Past Medical History:  Diagnosis Date   Age-related macular degeneration    Anemia    using iron daily   Arthritis    OA- knees, back, spine, hands    Asthma    Asthmatic bronchitis    Cancer ([place]) 2017   breast - pre-cancer   Complication of anesthesia    hard to wake    Diabetes mellitus without complication [facility]) 4008   Dr. Nicholes Stairs- endocrinologist- with [facility]   Family history of adverse reaction to anesthesia    Mother of pt. hard to wake up    Heart murmur    Moderate Aortic Stenosis 08/16/21 echo (Dr. Elonda Husky, [place])   History of hiatal hernia    Hyperlipemia    Hypertension    Hypothyroidism    Peripheral vascular disease ([place]) 1981   DVT- bilateral LE- postop- hysterectomy   Pneumonia    as a baby - double pneumonia - still has scarring in her lungs    Sleep apnea    CPAP- anytime she rests   Past Surgical History:  Procedure Laterality Date   ABDOMINAL HYSTERECTOMY     ANTERIOR LAT LUMBAR FUSION Left 02/25/2020   Procedure: LEFT-LATERAL INTERBODY FUSION LUMBAR 2 - LUMBAR 3 WITH INSTRUMENTATION AND ALLOGRAFT;  Surgeon: Phylliss Bob, MD;  Location: [place];  Service: Orthopedics;  Laterality: Left;   BREAST SURGERY Right 2017   pathology- precancer   CERVICAL SPINE SURGERY      CHOLECYSTECTOMY     DILATION AND CURETTAGE OF UTERUS     EYE SURGERY Bilateral    cataracts removed.    hystersalpingogram     KNEE CARTILAGE SURGERY Bilateral    2015, 2020   PARTIAL HYSTERECTOMY     SHOULDER ARTHROSCOPY WITH OPEN ROTATOR CUFF REPAIR Left    THYROIDECTOMY  unsure   TONSILLECTOMY     TRANSFORAMINAL LUMBAR INTERBODY FUSION (TLIF) WITH PEDICLE SCREW FIXATION 1 LEVEL Left 08/20/2019   Procedure: LEFT-SIDED LUMBAR 3-4 TRANSFORAMINAL LUMBAR INTERBODY FUSION WITH INSTRUMENTATION AND ALLOGRAFT;  Surgeon: Phylliss Bob, MD;  Location: [place];  Service: Orthopedics;  Laterality: Left;   TRANSFORAMINAL LUMBAR INTERBODY FUSION (TLIF) WITH PEDICLE SCREW FIXATION 2 LEVEL N/A 10/13/2021   Procedure: THORACIC TWELVE- LUMBAR ONE, LUMBAR ONE - LUMBAR TWO DECOMPRESSION AND FUSION WITH INSTRUMENTATION AND ALLOGRAFT;  Surgeon: Phylliss Bob, MD;  Location: [place];  Service: Orthopedics;  Laterality: N/A;   Patient Active Problem List   Diagnosis Date Noted   Radiculopathy 08/20/2019    REFERRING DIAG: s/p T-12-L2 Fusion   THERAPY DIAG:  Other low back pain  Muscle weakness (generalized)  Difficulty in walking, not elsewhere classified  Unsteadiness on feet  PERTINENT HISTORY: HTN, T2DM, aortic valve stenosis, iron deficiency anemia, history of DVT, multiple back surgeries for stenosis  PRECAUTIONS: none  SUBJECTIVE: Pt reports that she feels like she has recovered mostly from doing weights last week, but more sore than she had been previously.    PAIN:  Are you having pain? Yes: NPRS scale:  4/10 Pain location: R low back Pain description: sore  OBJECTIVE: (objective measures completed at initial evaluation unless otherwise dated)  Sensation                                              01/31/2022  Light Touch Appears Intact   Additional Comments reports numbness R hip (L1 dermatome)      Posture/Postural Control  Posture/Postural Control Postural limitations   Postural  Limitations Rounded Shoulders;Forward head;Decreased lumbar lordosis      ROM / Strength  AROM / PROM / Strength AROM;Strength      AROM  Overall AROM  Deficits   AROM Assessment Site Lumbar   Lumbar Flexion to mid shin, flexing at hips   Lumbar Extension limited 50%   Lumbar - Right Side Bend to above knee   reports R LB ache  Lumbar - Left Side Bend unable   due to R LB tightness  Lumbar - Right Rotation limited 50%   Lumbar - Left Rotation limited 50%      Strength  Overall Strength Within functional limits for tasks performed   Overall Strength Comments tested in sitting bil LE all myotomes, L hip flexion 4+/5, all other 5/5      Ambulation/Gait  Ambulation/Gait Yes   Ambulation/Gait Assistance 7: Independent   Ambulation Distance (Feet) 600 Feet   Assistive device None   Gait Pattern Within Functional Limits      6 minute walk test results   Aerobic Endurance Distance Walked 600   Endurance additional comments dyspnea on exertion, needed sitting and standing rest breaks.      Standardized Balance Assessment  Standardized Balance Assessment Five Times Sit to Stand   Five times sit to stand comments  17.5 seconds without UE assist, reports increased ache in LB     Observation/Other Assessments                                  02/02/2022    Focus on Therapeutic Outcomes (FOTO)  54 (predicted 56)          Standardized Balance Assessment    Standardized Balance Assessment Dynamic Gait Index          Dynamic Gait Index    Level Surface Mild Impairment     Change in Gait Speed Normal     Gait with Horizontal Head Turns Normal     Gait with Vertical Head Turns Mild Impairment     Gait and Pivot Turn Normal     Step Over Obstacle Normal     Step Around Obstacles Normal     Steps Mild Impairment     Total Score 21     DGI comment: moderate fall risk     TODAY'S TREATMENT:  03/01/22  Therapeutic Exercise: to improve strength and mobility.  Demo, verbal and tactile cues  throughout for technique. Nustep L6 x 6 min  Heel toe raises x 20 Squats (  2 UE support on counter) 2 x 10  Lunges (1 UE support) 2 x 10 bil  Step ups (2 risers, 2 UE support) 2 x 10 bil  Seated p-ball roll-outs PPT 5 x 5 sec hold Bridges x 10 SLR 2 x 10 bil  Manual Therapy: to decrease muscle spasm and pain and improve mobility IASTM with foam roller to bil glutes/lumbar paraspinals, STM to R erector spinae   02/21/22 Therapeutic Exercise: Nu Step L4x26mn  Gait Training: 4x170 ft to warm up  Manual Therapy: STM to bil quads, R ITB, R lumbar paraspinals  MHP x 10 to low back post session  02/17/22 Therapeutic Exercise: Recumbent bike: L3x649m Sidesteps (3x down and back along counter) monster walks with red TB (2x down and back along counter) Squats with red TB at knees with UE support 10x Lat pulls and standing rows 20# 2x10 Seated marches and LAQ with 2# weights 2x10 Hip throws in sitting with yellow weighted ball bil 2x10  Physical Performance Test:  5x STS -  10.75 sec w/o UE support, no increase in LBP  Manual Therapy:  STM to R thoracolumbar paraspinals and QL in sitting    PATIENT EDUCATION: Education details: continue HEP Person educated: Patient Education method: Explanation Education comprehension: verbalized understanding   HOME EXERCISE PROGRAM: [id number]: CLINICAL IMPRESSION: BaHENRIETTE HESSERas able to participate throughout session but did demonstrate increased cramping in hip flexors with SLR, and fatigue in quads with step ups and lunges.  Manual therapy at end of session noted continued muscle spasm along R erector spinae, reported decreased pain/soreness at end of session.  She would benefit from continued skilled therapy.    PT Short Term Goals       PT SHORT TERM GOAL #1   Title Complete FOTO for lumbar spine    Status Achieved      PT SHORT TERM GOAL #2   Title Complete DGI to assess balance and safety    Status Achieved               PT Long Term Goals - 02/08/22 0806       PT LONG TERM GOAL #1   Title Ind with progressive HEP to improve outcomes/carryover    Time 6    Period Weeks    Status On-going    Target Date 03/14/22      PT LONG TERM GOAL #2   Title Pt. will demonstrate improved endurance by being able to walk for 15 minutes without pain or dyspnea    Baseline 600'/4 min    Time 6    Period Weeks    Status On-going    Target Date 03/14/22      PT LONG TERM GOAL #3   Title Pt. will demonstrate decreased fall risk by scoring at least 19/24 on DGI    Time 6    Period Weeks    Status On-going    Target Date 03/14/22      PT LONG TERM GOAL #4   Title Pt. will be able to complete household ADLs without increased LBP/discomfort with good form.    Baseline limitations with doing household chores, picking items off floor    Time 6    Period Weeks    Status On-going    Target Date 03/14/22      PT LONG TERM GOAL #5   Title Pt. will demonstrate improved functional LE strength by completing 5x STS <14 seconds  Baseline 17.5 sec, reports increased ache in low back    Time 6    Period Weeks    Status Achieved - 02/17/22- see under today's treatment for specifics    Target Date 03/14/22      PT LONG TERM GOAL #6   Title Patient will be confident transitioning to community based exercise program (silver sneakers/water aerobics) to maintain progress.    Time 6    Period Weeks    Status On-going    Target Date 03/14/22            PLAN:  PT Frequency 2x / week   PT Duration 6 weeks   PT Treatment/Interventions ADLs/Self Care Home Management;Cryotherapy;Electrical Stimulation;Moist Heat;Gait training;Stair training;Functional mobility training;Therapeutic activities;Therapeutic exercise;Balance training;Neuromuscular re-education;Patient/family education;Manual techniques   PT Next Visit Plan Continue to progress lumbar/hip strengthening.             Patient will benefit  from skilled therapeutic intervention in order to improve the following deficits and impairments:  Decreased activity tolerance, Decreased endurance, Decreased range of motion, Decreased strength, Impaired sensation, Pain, Improper body mechanics, Decreased balance, Decreased mobility, Difficulty walking, Increased muscle spasms, Impaired flexibility, Postural dysfunction   Rennie Natter, PT, DPT  03/01/2022, 8:51 AM

## 2022-03-03 ENCOUNTER — Encounter: Payer: Self-pay | Admitting: Physical Therapy

## 2022-03-03 ENCOUNTER — Ambulatory Visit: Payer: Medicare Other | Attending: Orthopedic Surgery | Admitting: Physical Therapy

## 2022-03-03 DIAGNOSIS — R2681 Unsteadiness on feet: Secondary | ICD-10-CM | POA: Diagnosis present

## 2022-03-03 DIAGNOSIS — M6281 Muscle weakness (generalized): Secondary | ICD-10-CM | POA: Diagnosis present

## 2022-03-03 DIAGNOSIS — R262 Difficulty in walking, not elsewhere classified: Secondary | ICD-10-CM | POA: Insufficient documentation

## 2022-03-03 DIAGNOSIS — M5459 Other low back pain: Secondary | ICD-10-CM | POA: Diagnosis present

## 2022-03-03 NOTE — Therapy (Signed)
OUTPATIENT PHYSICAL THERAPY TREATMENT NOTE Rationale for Evaluation and Treatment Rehabilitation   Patient Name: Cheryl Finley MRN: [id number] DOB:07-04-1946, 76 y.o., female Today's Date: 03/03/2022  PCP: Stacie Glaze, DO  REFERRING PROVIDER: Phylliss Bob, MD   PT End of Session - 03/03/22 1111     Visit Number 9    Number of Visits 12    Date for PT Re-Evaluation 03/14/22    Authorization Type [employer] Medicare    Progress Note Due on Visit 10    PT Start Time 1104    PT Stop Time 1145    PT Time Calculation (min) 41 min    Activity Tolerance Patient tolerated treatment well    Behavior During Therapy WFL for tasks assessed/performed               Past Medical History:  Diagnosis Date   Age-related macular degeneration    Anemia    using iron daily   Arthritis    OA- knees, back, spine, hands    Asthma    Asthmatic bronchitis    Cancer ([place]) 2017   breast - pre-cancer   Complication of anesthesia    hard to wake    Diabetes mellitus without complication [facility]) 4098   Dr. Nicholes Stairs- endocrinologist- with [facility]   Family history of adverse reaction to anesthesia    Mother of pt. hard to wake up    Heart murmur    Moderate Aortic Stenosis 08/16/21 echo (Dr. Elonda Husky, [place])   History of hiatal hernia    Hyperlipemia    Hypertension    Hypothyroidism    Peripheral vascular disease ([place]) 1981   DVT- bilateral LE- postop- hysterectomy   Pneumonia    as a baby - double pneumonia - still has scarring in her lungs    Sleep apnea    CPAP- anytime she rests   Past Surgical History:  Procedure Laterality Date   ABDOMINAL HYSTERECTOMY     ANTERIOR LAT LUMBAR FUSION Left 02/25/2020   Procedure: LEFT-LATERAL INTERBODY FUSION LUMBAR 2 - LUMBAR 3 WITH INSTRUMENTATION AND ALLOGRAFT;  Surgeon: Phylliss Bob, MD;  Location: [place];  Service: Orthopedics;  Laterality: Left;   BREAST SURGERY Right 2017   pathology- precancer   CERVICAL SPINE SURGERY      CHOLECYSTECTOMY     DILATION AND CURETTAGE OF UTERUS     EYE SURGERY Bilateral    cataracts removed.    hystersalpingogram     KNEE CARTILAGE SURGERY Bilateral    2015, 2020   PARTIAL HYSTERECTOMY     SHOULDER ARTHROSCOPY WITH OPEN ROTATOR CUFF REPAIR Left    THYROIDECTOMY  unsure   TONSILLECTOMY     TRANSFORAMINAL LUMBAR INTERBODY FUSION (TLIF) WITH PEDICLE SCREW FIXATION 1 LEVEL Left 08/20/2019   Procedure: LEFT-SIDED LUMBAR 3-4 TRANSFORAMINAL LUMBAR INTERBODY FUSION WITH INSTRUMENTATION AND ALLOGRAFT;  Surgeon: Phylliss Bob, MD;  Location: [place];  Service: Orthopedics;  Laterality: Left;   TRANSFORAMINAL LUMBAR INTERBODY FUSION (TLIF) WITH PEDICLE SCREW FIXATION 2 LEVEL N/A 10/13/2021   Procedure: THORACIC TWELVE- LUMBAR ONE, LUMBAR ONE - LUMBAR TWO DECOMPRESSION AND FUSION WITH INSTRUMENTATION AND ALLOGRAFT;  Surgeon: Phylliss Bob, MD;  Location: [place];  Service: Orthopedics;  Laterality: N/A;   Patient Active Problem List   Diagnosis Date Noted   Radiculopathy 08/20/2019    REFERRING DIAG: s/p T-12-L2 Fusion   THERAPY DIAG:  Other low back pain  Muscle weakness (generalized)  Difficulty in walking, not elsewhere classified  Unsteadiness on feet  PERTINENT HISTORY: HTN, T2DM, aortic valve stenosis, iron deficiency anemia, history of DVT, multiple back surgeries for stenosis  PRECAUTIONS: none  SUBJECTIVE: Pt reports still has some tightness in low back but getting better.     PAIN:  Are you having pain? Yes: NPRS scale:  2/10 Pain location: R low back Pain description: sore  OBJECTIVE: (objective measures completed at initial evaluation unless otherwise dated)  Sensation                                              01/31/2022  Light Touch Appears Intact   Additional Comments reports numbness R hip (L1 dermatome)      Posture/Postural Control  Posture/Postural Control Postural limitations   Postural Limitations Rounded Shoulders;Forward head;Decreased lumbar  lordosis      ROM / Strength  AROM / PROM / Strength AROM;Strength      AROM  Overall AROM  Deficits   AROM Assessment Site Lumbar   Lumbar Flexion to mid shin, flexing at hips   Lumbar Extension limited 50%   Lumbar - Right Side Bend to above knee   reports R LB ache  Lumbar - Left Side Bend unable   due to R LB tightness  Lumbar - Right Rotation limited 50%   Lumbar - Left Rotation limited 50%      Strength  Overall Strength Within functional limits for tasks performed   Overall Strength Comments tested in sitting bil LE all myotomes, L hip flexion 4+/5, all other 5/5      Ambulation/Gait  Ambulation/Gait Yes   Ambulation/Gait Assistance 7: Independent   Ambulation Distance (Feet) 600 Feet   Assistive device None   Gait Pattern Within Functional Limits      6 minute walk test results   Aerobic Endurance Distance Walked 600   Endurance additional comments dyspnea on exertion, needed sitting and standing rest breaks.      Standardized Balance Assessment  Standardized Balance Assessment Five Times Sit to Stand   Five times sit to stand comments  17.5 seconds without UE assist, reports increased ache in LB     Observation/Other Assessments                                  02/02/2022    Focus on Therapeutic Outcomes (FOTO)  54 (predicted 56)          Standardized Balance Assessment    Standardized Balance Assessment Dynamic Gait Index          Dynamic Gait Index    Level Surface Mild Impairment     Change in Gait Speed Normal     Gait with Horizontal Head Turns Normal     Gait with Vertical Head Turns Mild Impairment     Gait and Pivot Turn Normal     Step Over Obstacle Normal     Step Around Obstacles Normal     Steps Mild Impairment     Total Score 21     DGI comment: moderate fall risk     TODAY'S TREATMENT:  03/03/22 Therapeutic Exercise: to improve strength and mobility.  Demo, verbal and tactile cues throughout for technique. Nustep L6 x 6 min Seated P-ball  stretches - forward and to side of preference 2 x 10 Upper trunk twists - side  of preference 2 x 10 Supine PPT 10 x 10 sec hold Sequential hooklying leg march and lower x 5 bil - challenging.  LTR -side of preference x 10 Manual Therapy: to decrease muscle spasm and pain and improve mobility.  IASTM with foam roller to R glutes, IASTM with s/s tools to R erector spinae, STM and TPR to R lumbar erector spinae.  Grade 1-2 R UPA mobs lumbar spine.    03/01/22  Therapeutic Exercise: to improve strength and mobility.  Demo, verbal and tactile cues throughout for technique. Nustep L6 x 6 min  Heel toe raises x 20 Squats (2 UE support on counter) 2 x 10  Lunges (1 UE support) 2 x 10 bil  Step ups (2 risers, 2 UE support) 2 x 10 bil  Seated p-ball roll-outs PPT 5 x 5 sec hold Bridges x 10 SLR 2 x 10 bil  Manual Therapy: to decrease muscle spasm and pain and improve mobility IASTM with foam roller to bil glutes/lumbar paraspinals, STM to R erector spinae   02/21/22 Therapeutic Exercise: Nu Step L4x63mn Gait Training: 4x170 ft to warm up Manual Therapy: STM to bil quads, R ITB, R lumbar paraspinals MHP x 10 to low back post session   PATIENT EDUCATION: Education details: continue HEP Person educated: Patient Education method: Explanation Education comprehension: verbalized understanding   HOME EXERCISE PROGRAM: [id number] ASSESSMENT: CLINICAL IMPRESSION: BSELENY ALLBRIGHT is making good progress but continues to report R sided low back pain and spasm.  Today focused on TMR based exercises to side of preference to help release tight muscles without aggravating, which she reported did decrease tightness in that area, along with core strengthening.  Manual therapy at end of session to further decrease muscle spasm.  She would benefit from continued skilled therapy.      PT Short Term Goals       PT SHORT TERM GOAL #1   Title Complete FOTO for lumbar spine    Status Achieved       PT SHORT TERM GOAL #2   Title Complete DGI to assess balance and safety    Status Achieved              PT Long Term Goals - 02/08/22 0806       PT LONG TERM GOAL #1   Title Ind with progressive HEP to improve outcomes/carryover    Time 6    Period Weeks    Status On-going    Target Date 03/14/22      PT LONG TERM GOAL #2   Title Pt. will demonstrate improved endurance by being able to walk for 15 minutes without pain or dyspnea    Baseline 600'/4 min    Time 6    Period Weeks    Status On-going    Target Date 03/14/22      PT LONG TERM GOAL #3   Title Pt. will demonstrate decreased fall risk by scoring at least 19/24 on DGI    Time 6    Period Weeks    Status On-going    Target Date 03/14/22      PT LONG TERM GOAL #4   Title Pt. will be able to complete household ADLs without increased LBP/discomfort with good form.    Baseline limitations with doing household chores, picking items off floor    Time 6    Period Weeks    Status On-going    Target Date 03/14/22  PT LONG TERM GOAL #5   Title Pt. will demonstrate improved functional LE strength by completing 5x STS <14 seconds    Baseline 17.5 sec, reports increased ache in low back    Time 6    Period Weeks    Status Achieved - 02/17/22- see under today's treatment for specifics    Target Date 03/14/22      PT LONG TERM GOAL #6   Title Patient will be confident transitioning to community based exercise program (silver sneakers/water aerobics) to maintain progress.    Time 6    Period Weeks    Status On-going    Target Date 03/14/22            PLAN:  PT Frequency 2x / week   PT Duration 6 weeks   PT Treatment/Interventions ADLs/Self Care Home Management;Cryotherapy;Electrical Stimulation;Moist Heat;Gait training;Stair training;Functional mobility training;Therapeutic activities;Therapeutic exercise;Balance training;Neuromuscular re-education;Patient/family education;Manual techniques   PT Next Visit  Plan Continue to progress core/lumbar/hip strengthening.             Patient will benefit from skilled therapeutic intervention in order to improve the following deficits and impairments:  Decreased activity tolerance, Decreased endurance, Decreased range of motion, Decreased strength, Impaired sensation, Pain, Improper body mechanics, Decreased balance, Decreased mobility, Difficulty walking, Increased muscle spasms, Impaired flexibility, Postural dysfunction   Rennie Natter, PT, DPT  03/03/2022, 11:53 AM

## 2022-03-07 ENCOUNTER — Encounter: Payer: Self-pay | Admitting: Physical Therapy

## 2022-03-07 ENCOUNTER — Ambulatory Visit: Payer: Medicare Other | Admitting: Physical Therapy

## 2022-03-07 DIAGNOSIS — R2681 Unsteadiness on feet: Secondary | ICD-10-CM

## 2022-03-07 DIAGNOSIS — M5459 Other low back pain: Secondary | ICD-10-CM | POA: Diagnosis not present

## 2022-03-07 DIAGNOSIS — M6281 Muscle weakness (generalized): Secondary | ICD-10-CM

## 2022-03-07 DIAGNOSIS — R262 Difficulty in walking, not elsewhere classified: Secondary | ICD-10-CM

## 2022-03-07 NOTE — Therapy (Signed)
OUTPATIENT PHYSICAL THERAPY TREATMENT NOTE Rationale for Evaluation and Treatment Rehabilitation   Progress Note Reporting Period 01/31/2022 to 03/07/2022  See note below for Objective Data and Assessment of Progress/Goals.     Patient Name: Cheryl Finley MRN: [id number] DOB:[birth year], 76 y.o., female 69 Date: 03/07/2022  PCP: Stacie Glaze, DO  REFERRING PROVIDER: Phylliss Bob, MD   PT End of Session - 03/07/22 0806     Visit Number 10    Number of Visits 12    Date for PT Re-Evaluation 03/14/22    Authorization Type [employer] Medicare    Progress Note Due on Visit 10    PT Start Time 0804    PT Stop Time 0847    PT Time Calculation (min) 43 min    Activity Tolerance Patient tolerated treatment well    Behavior During Therapy [facility] for tasks assessed/performed               Past Medical History:  Diagnosis Date   Age-related macular degeneration    Anemia    using iron daily   Arthritis    OA- knees, back, spine, hands    Asthma    Asthmatic bronchitis    Cancer ([place]) 2017   breast - pre-cancer   Complication of anesthesia    hard to wake    Diabetes mellitus without complication [facility]) 2633   Dr. Nicholes Stairs- endocrinologist- with [facility]   Family history of adverse reaction to anesthesia    Mother of pt. hard to wake up    Heart murmur    Moderate Aortic Stenosis 08/16/21 echo (Dr. Elonda Husky, [place])   History of hiatal hernia    Hyperlipemia    Hypertension    Hypothyroidism    Peripheral vascular disease ([place]) 1981   DVT- bilateral LE- postop- hysterectomy   Pneumonia    as a baby - double pneumonia - still has scarring in her lungs    Sleep apnea    CPAP- anytime she rests   Past Surgical History:  Procedure Laterality Date   ABDOMINAL HYSTERECTOMY     ANTERIOR LAT LUMBAR FUSION Left 02/25/2020   Procedure: LEFT-LATERAL INTERBODY FUSION LUMBAR 2 - LUMBAR 3 WITH INSTRUMENTATION AND ALLOGRAFT;  Surgeon: Phylliss Bob, MD;  Location: [place];   Service: Orthopedics;  Laterality: Left;   BREAST SURGERY Right 2017   pathology- precancer   CERVICAL SPINE SURGERY     CHOLECYSTECTOMY     DILATION AND CURETTAGE OF UTERUS     EYE SURGERY Bilateral    cataracts removed.    hystersalpingogram     KNEE CARTILAGE SURGERY Bilateral    2015, 2020   PARTIAL HYSTERECTOMY     SHOULDER ARTHROSCOPY WITH OPEN ROTATOR CUFF REPAIR Left    THYROIDECTOMY  unsure   TONSILLECTOMY     TRANSFORAMINAL LUMBAR INTERBODY FUSION (TLIF) WITH PEDICLE SCREW FIXATION 1 LEVEL Left 08/20/2019   Procedure: LEFT-SIDED LUMBAR 3-4 TRANSFORAMINAL LUMBAR INTERBODY FUSION WITH INSTRUMENTATION AND ALLOGRAFT;  Surgeon: Phylliss Bob, MD;  Location: [place];  Service: Orthopedics;  Laterality: Left;   TRANSFORAMINAL LUMBAR INTERBODY FUSION (TLIF) WITH PEDICLE SCREW FIXATION 2 LEVEL N/A 10/13/2021   Procedure: THORACIC TWELVE- LUMBAR ONE, LUMBAR ONE - LUMBAR TWO DECOMPRESSION AND FUSION WITH INSTRUMENTATION AND ALLOGRAFT;  Surgeon: Phylliss Bob, MD;  Location: [place];  Service: Orthopedics;  Laterality: N/A;   Patient Active Problem List   Diagnosis Date Noted   Radiculopathy 08/20/2019    REFERRING DIAG: s/p T-12-L2 Fusion  THERAPY DIAG:  Other low back pain  Muscle weakness (generalized)  Difficulty in walking, not elsewhere classified  Unsteadiness on feet  PERTINENT HISTORY: HTN, T2DM, aortic valve stenosis, iron deficiency anemia, history of DVT, multiple back surgeries for stenosis  PRECAUTIONS: none  SUBJECTIVE: Pt reports she had a rough morning 5-6/10 when woke up, stretches helped.   PAIN:  Are you having pain? Yes: NPRS scale:  3/10 Pain location: R low back Pain description: sore  OBJECTIVE: (objective measures completed at initial evaluation unless otherwise dated)  Sensation                                              01/31/2022 03/07/2022  Light Touch Appears Intact    Additional Comments reports numbness R hip (L1 dermatome)         Posture/Postural Control   Posture/Postural Control Postural limitations    Postural Limitations Rounded Shoulders;Forward head;Decreased lumbar lordosis        ROM / Strength   AROM / PROM / Strength AROM;Strength        AROM   Overall AROM  Deficits    AROM Assessment Site Lumbar    Lumbar Flexion to mid shin, flexing at hips    Lumbar Extension limited 50%    Lumbar - Right Side Bend to above knee   reports R LB ache   Lumbar - Left Side Bend unable   due to R LB tightness   Lumbar - Right Rotation limited 50%    Lumbar - Left Rotation limited 50%        Strength   Overall Strength Within functional limits for tasks performed    Overall Strength Comments tested in sitting bil LE all myotomes, L hip flexion 4+/5, all other 5/5        Ambulation/Gait   Ambulation/Gait Yes    Ambulation/Gait Assistance 7: Independent    Ambulation Distance (Feet) 600 Feet    Assistive device None    Gait Pattern Within Functional Limits        6 minute walk test results    Aerobic Endurance Distance Walked 600  925  Endurance additional comments dyspnea on exertion, needed sitting and standing rest breaks.  Dyspnea, 1 seated rest break      Standardized Balance Assessment   Standardized Balance Assessment Five Times Sit to Stand    Five times sit to stand comments  17.5 seconds without UE assist, reports increased ache in LB  11.4 seconds    Observation/Other Assessments                                  02/02/2022 03/07/22     Focus on Therapeutic Outcomes (FOTO)  54 (predicted 56)              Standardized Balance Assessment      Standardized Balance Assessment Dynamic Gait Index  Dynamic Gait Index            Dynamic Gait Index      Level Surface Mild Impairment  Normal     Change in Gait Speed Normal  Normal     Gait with Horizontal Head Turns Normal  Normal     Gait with Vertical Head Turns Mild Impairment  Normal     Gait and  Pivot Turn Normal  Normal     Step Over Obstacle Normal   Normal     Step Around Obstacles Normal  Normal     Steps Mild Impairment  Mild Impairment     Total Score 21                           23     DGI comment: moderate fall risk       TODAY'S TREATMENT:  03/07/2022  Nustep L5 x 4 min - warm - up and subjective Physical Performance Test 6MWT -  925' - 1 seated rest break.  DGI 23/24 5xSTS Ultrasound - to decrease muscle spasm and improve blood flow, 1MHz, 1.2 w/cm2, cont, to R lumbar paraspinals x 8 min   03/03/22 Therapeutic Exercise: to improve strength and mobility.  Demo, verbal and tactile cues throughout for technique. Nustep L6 x 6 min Seated P-ball stretches - forward and to side of preference 2 x 10 Upper trunk twists - side of preference 2 x 10 Supine PPT 10 x 10 sec hold Sequential hooklying leg march and lower x 5 bil - challenging.  LTR -side of preference x 10 Manual Therapy: to decrease muscle spasm and pain and improve mobility.  IASTM with foam roller to R glutes, IASTM with s/s tools to R erector spinae, STM and TPR to R lumbar erector spinae.  Grade 1-2 R UPA mobs lumbar spine.    03/01/22  Therapeutic Exercise: to improve strength and mobility.  Demo, verbal and tactile cues throughout for technique. Nustep L6 x 6 min  Heel toe raises x 20 Squats (2 UE support on counter) 2 x 10  Lunges (1 UE support) 2 x 10 bil  Step ups (2 risers, 2 UE support) 2 x 10 bil  Seated p-ball roll-outs PPT 5 x 5 sec hold Bridges x 10 SLR 2 x 10 bil  Manual Therapy: to decrease muscle spasm and pain and improve mobility IASTM with foam roller to bil glutes/lumbar paraspinals, STM to R erector spinae   02/21/22 Therapeutic Exercise: Nu Step L4x74min Gait Training: 4x170 ft to warm up Manual Therapy: STM to bil quads, R ITB, R lumbar paraspinals MHP x 10 to low back post session   PATIENT EDUCATION: Education details: continue HEP Person educated: Patient Education method: Explanation Education comprehension: verbalized  understanding   HOME EXERCISE PROGRAM: [id number]  ASSESSMENT: CLINICAL IMPRESSION: Cheryl Finley  is making good progress and has met 4/5 LTG goals.   She demonstrates improved LE strength, balance, and endurance.  She still reports LBP, but does understand that it takes up to a year for full tissue healing.  She is planning on transitioning to gym based program when finishes current episode of PT.  Today after reviewing goals performed US to R lumbar paraspinals in sitting to help decrease muscle spasm, reported decreased pain following.      PT Short Term Goals       PT SHORT TERM GOAL #1   Title Complete FOTO for lumbar spine    Status Achieved      PT SHORT TERM GOAL #2   Title Complete DGI to assess balance and safety    Status Achieved              PT Long Term Goals - 02/08/22 0806       PT LONG TERM GOAL #1   Title Ind with progressive HEP to improve  outcomes/carryover    Time 6    Period Weeks    Status Achieved 03/07/22 met for current   Target Date 03/14/22      PT LONG TERM GOAL #2   Title Pt. will demonstrate improved endurance by being able to walk for 15 minutes without pain or dyspnea    Baseline 600'/4 min    Time 6    Period Weeks    Status On-going  03/07/22 925' in 6 minutes, 1 seated rest break needed, DOE   Target Date 03/14/22      PT LONG TERM GOAL #3   Title Pt. will demonstrate decreased fall risk by scoring at least 19/24 on DGI    Time 6    Period Weeks    Status Achieved - 23/24   Target Date 03/14/22      PT LONG TERM GOAL #4   Title Pt. will be able to complete household ADLs without increased LBP/discomfort with good form.    Baseline limitations with doing household chores, picking items off floor    Time 6    Period Weeks    Status Achieved - 03/07/22 using golfer's lift, much easier    Target Date 03/14/22      PT LONG TERM GOAL #5   Title Pt. will demonstrate improved functional LE strength by completing 5x STS <14 seconds     Baseline 17.5 sec, reports increased ache in low back    Time 6    Period Weeks    Status Achieved - 02/17/22- see under today's treatment for specifics  03/07/22- 11.4 seconds   Target Date 03/14/22      PT LONG TERM GOAL #6   Title Patient will be confident transitioning to community based exercise program (silver sneakers/water aerobics) to maintain progress.    Time 6    Period Weeks    Status Achieved - 03/07/22 has joined [employer]    Target Date 03/14/22            PLAN:  PT Frequency 2x / week   PT Duration 6 weeks   PT Treatment/Interventions ADLs/Self Care Home Management;Cryotherapy;Electrical Stimulation;Moist Heat;Gait training;Stair training;Functional mobility training;Therapeutic activities;Therapeutic exercise;Balance training;Neuromuscular re-education;Patient/family education;Manual techniques   PT Next Visit Plan Review HEP, ADLs, continue to progress core/lumbar/hip strengthening as tolerated.             Patient will benefit from skilled therapeutic intervention in order to improve the following deficits and impairments:  Decreased activity tolerance, Decreased endurance, Decreased range of motion, Decreased strength, Impaired sensation, Pain, Improper body mechanics, Decreased balance, Decreased mobility, Difficulty walking, Increased muscle spasms, Impaired flexibility, Postural dysfunction   Rennie Natter, PT, DPT  03/07/2022, 9:01 AM

## 2022-03-10 ENCOUNTER — Ambulatory Visit: Payer: Medicare Other

## 2022-03-10 DIAGNOSIS — R262 Difficulty in walking, not elsewhere classified: Secondary | ICD-10-CM

## 2022-03-10 DIAGNOSIS — M5459 Other low back pain: Secondary | ICD-10-CM

## 2022-03-10 DIAGNOSIS — R2681 Unsteadiness on feet: Secondary | ICD-10-CM

## 2022-03-10 DIAGNOSIS — M6281 Muscle weakness (generalized): Secondary | ICD-10-CM

## 2022-03-10 NOTE — Therapy (Signed)
OUTPATIENT PHYSICAL THERAPY TREATMENT NOTE Rationale for Evaluation and Treatment Rehabilitation       Patient Name: Cheryl Finley MRN: [id number] DOB:24-Sep-1946, 76 y.o., female Today's Date: 03/10/2022  PCP: Stacie Glaze, DO  REFERRING PROVIDER: Phylliss Bob, MD   PT End of Session - 03/10/22 0834     Visit Number 11    Number of Visits 12    Date for PT Re-Evaluation 03/14/22    Authorization Type [employer] Medicare    Progress Note Due on Visit 10    PT Start Time 0759    PT Stop Time 0854    PT Time Calculation (min) 55 min    Activity Tolerance Patient tolerated treatment well    Behavior During Therapy [facility] for tasks assessed/performed                Past Medical History:  Diagnosis Date   Age-related macular degeneration    Anemia    using iron daily   Arthritis    OA- knees, back, spine, hands    Asthma    Asthmatic bronchitis    Cancer ([place]) 2017   breast - pre-cancer   Complication of anesthesia    hard to wake    Diabetes mellitus without complication [facility]) 2122   Dr. Nicholes Stairs- endocrinologist- with [facility]   Family history of adverse reaction to anesthesia    Mother of pt. hard to wake up    Heart murmur    Moderate Aortic Stenosis 08/16/21 echo (Dr. Elonda Husky, [place])   History of hiatal hernia    Hyperlipemia    Hypertension    Hypothyroidism    Peripheral vascular disease ([place]) 1981   DVT- bilateral LE- postop- hysterectomy   Pneumonia    as a baby - double pneumonia - still has scarring in her lungs    Sleep apnea    CPAP- anytime she rests   Past Surgical History:  Procedure Laterality Date   ABDOMINAL HYSTERECTOMY     ANTERIOR LAT LUMBAR FUSION Left 02/25/2020   Procedure: LEFT-LATERAL INTERBODY FUSION LUMBAR 2 - LUMBAR 3 WITH INSTRUMENTATION AND ALLOGRAFT;  Surgeon: Phylliss Bob, MD;  Location: [place];  Service: Orthopedics;  Laterality: Left;   BREAST SURGERY Right 2017   pathology- precancer   CERVICAL SPINE SURGERY      CHOLECYSTECTOMY     DILATION AND CURETTAGE OF UTERUS     EYE SURGERY Bilateral    cataracts removed.    hystersalpingogram     KNEE CARTILAGE SURGERY Bilateral    2015, 2020   PARTIAL HYSTERECTOMY     SHOULDER ARTHROSCOPY WITH OPEN ROTATOR CUFF REPAIR Left    THYROIDECTOMY  unsure   TONSILLECTOMY     TRANSFORAMINAL LUMBAR INTERBODY FUSION (TLIF) WITH PEDICLE SCREW FIXATION 1 LEVEL Left 08/20/2019   Procedure: LEFT-SIDED LUMBAR 3-4 TRANSFORAMINAL LUMBAR INTERBODY FUSION WITH INSTRUMENTATION AND ALLOGRAFT;  Surgeon: Phylliss Bob, MD;  Location: [place];  Service: Orthopedics;  Laterality: Left;   TRANSFORAMINAL LUMBAR INTERBODY FUSION (TLIF) WITH PEDICLE SCREW FIXATION 2 LEVEL N/A 10/13/2021   Procedure: THORACIC TWELVE- LUMBAR ONE, LUMBAR ONE - LUMBAR TWO DECOMPRESSION AND FUSION WITH INSTRUMENTATION AND ALLOGRAFT;  Surgeon: Phylliss Bob, MD;  Location: [place];  Service: Orthopedics;  Laterality: N/A;   Patient Active Problem List   Diagnosis Date Noted   Radiculopathy 08/20/2019    REFERRING DIAG: s/p T-12-L2 Fusion   THERAPY DIAG:  Other low back pain  Muscle weakness (generalized)  Difficulty in walking, not elsewhere  classified  Unsteadiness on feet  PERTINENT HISTORY: HTN, T2DM, aortic valve stenosis, iron deficiency anemia, history of DVT, multiple back surgeries for stenosis  PRECAUTIONS: none  SUBJECTIVE: Pt feels that she has maximized her improvements with PT. She walked for ~1 mile yesterday but took a few rest breaks.  PAIN:  Are you having pain? Yes: NPRS scale:  3/10 Pain location: R low back Pain description: sore, constant  OBJECTIVE: (objective measures completed at initial evaluation unless otherwise dated)  Sensation                                              01/31/2022 03/07/2022  Light Touch Appears Intact    Additional Comments reports numbness R hip (L1 dermatome)        Posture/Postural Control   Posture/Postural Control Postural limitations     Postural Limitations Rounded Shoulders;Forward head;Decreased lumbar lordosis        ROM / Strength   AROM / PROM / Strength AROM;Strength        AROM   Overall AROM  Deficits    AROM Assessment Site Lumbar    Lumbar Flexion to mid shin, flexing at hips    Lumbar Extension limited 50%    Lumbar - Right Side Bend to above knee   reports R LB ache   Lumbar - Left Side Bend unable   due to R LB tightness   Lumbar - Right Rotation limited 50%    Lumbar - Left Rotation limited 50%        Strength   Overall Strength Within functional limits for tasks performed    Overall Strength Comments tested in sitting bil LE all myotomes, L hip flexion 4+/5, all other 5/5        Ambulation/Gait   Ambulation/Gait Yes    Ambulation/Gait Assistance 7: Independent    Ambulation Distance (Feet) 600 Feet    Assistive device None    Gait Pattern Within Functional Limits        6 minute walk test results    Aerobic Endurance Distance Walked 600  925  Endurance additional comments dyspnea on exertion, needed sitting and standing rest breaks.  Dyspnea, 1 seated rest break      Standardized Balance Assessment   Standardized Balance Assessment Five Times Sit to Stand    Five times sit to stand comments  17.5 seconds without UE assist, reports increased ache in LB  11.4 seconds    Observation/Other Assessments                                  02/02/2022 03/07/22     Focus on Therapeutic Outcomes (FOTO)  54 (predicted 56)              Standardized Balance Assessment      Standardized Balance Assessment Dynamic Gait Index  Dynamic Gait Index            Dynamic Gait Index      Level Surface Mild Impairment  Normal     Change in Gait Speed Normal  Normal     Gait with Horizontal Head Turns Normal  Normal     Gait with Vertical Head Turns Mild Impairment  Normal     Gait and Pivot Turn Normal  Normal  Step Over Obstacle Normal  Normal     Step Around Obstacles Normal  Normal     Steps Mild Impairment   Mild Impairment     Total Score 21                           23     DGI comment: moderate fall risk       TODAY'S TREATMENT:  03/10/22 Therapeutic Exercise: Golfers lifts R/L x 10 each Standing row with red TB 10x Standing shld extension with red TB 10x Standing multifidus walkout red 10x Seated lunge with back arch (b UE raise to promote lumbar extension) 10x5" Standing extension at wall 10x3" Hooklying march with red TB and trA brace 10x Hooklying clam with red TB  10x2" B KTC stretch 2x30" B hamstring stretch in supine with strap x 30 sec  Modalities:  Moist heat to lumbar spine x 10 min  03/07/2022  Nustep L5 x 4 min - warm - up and subjective Physical Performance Test 6MWT -  925' - 1 seated rest break.  DGI 23/24 5xSTS Ultrasound - to decrease muscle spasm and improve blood flow, 1MHz, 1.2 w/cm2, cont, to R lumbar paraspinals x 8 min   03/03/22 Therapeutic Exercise: to improve strength and mobility.  Demo, verbal and tactile cues throughout for technique. Nustep L6 x 6 min Seated P-ball stretches - forward and to side of preference 2 x 10 Upper trunk twists - side of preference 2 x 10 Supine PPT 10 x 10 sec hold Sequential hooklying leg march and lower x 5 bil - challenging.  LTR -side of preference x 10 Manual Therapy: to decrease muscle spasm and pain and improve mobility.  IASTM with foam roller to R glutes, IASTM with s/s tools to R erector spinae, STM and TPR to R lumbar erector spinae.  Grade 1-2 R UPA mobs lumbar spine.     PATIENT EDUCATION: Education details: continue HEP Person educated: Patient Education method: Explanation Education comprehension: verbalized understanding   HOME EXERCISE PROGRAM: [id number]  ASSESSMENT: CLINICAL IMPRESSION: Pt has reports of achiness in R low back but does not report it as pain. Pt had no reports of pain with interventions, instruction was given throughout session prn for exercises. Pt seems ready to wrap up with PT  based on performance. Ended session with 10 min moist heat to lumbar spine for muscle relaxation and to relieve achiness.   PT Short Term Goals       PT SHORT TERM GOAL #1   Title Complete FOTO for lumbar spine    Status Achieved      PT SHORT TERM GOAL #2   Title Complete DGI to assess balance and safety    Status Achieved              PT Long Term Goals - 02/08/22 0806       PT LONG TERM GOAL #1   Title Ind with progressive HEP to improve outcomes/carryover    Time 6    Period Weeks    Status Achieved 03/07/22 met for current   Target Date 03/14/22      PT LONG TERM GOAL #2   Title Pt. will demonstrate improved endurance by being able to walk for 15 minutes without pain or dyspnea    Baseline 600'/4 min    Time 6    Period Weeks    Status On-going  03/07/22 925' in 6 minutes, 1  seated rest break needed, DOE   Target Date 03/14/22      PT LONG TERM GOAL #3   Title Pt. will demonstrate decreased fall risk by scoring at least 19/24 on DGI    Time 6    Period Weeks    Status Achieved - 23/24   Target Date 03/14/22      PT LONG TERM GOAL #4   Title Pt. will be able to complete household ADLs without increased LBP/discomfort with good form.    Baseline limitations with doing household chores, picking items off floor    Time 6    Period Weeks    Status Achieved - 03/07/22 using golfer's lift, much easier    Target Date 03/14/22      PT LONG TERM GOAL #5   Title Pt. will demonstrate improved functional LE strength by completing 5x STS <14 seconds    Baseline 17.5 sec, reports increased ache in low back    Time 6    Period Weeks    Status Achieved - 02/17/22- see under today's treatment for specifics  03/07/22- 11.4 seconds   Target Date 03/14/22      PT LONG TERM GOAL #6   Title Patient will be confident transitioning to community based exercise program (silver sneakers/water aerobics) to maintain progress.    Time 6    Period Weeks    Status Achieved - 03/07/22 has  joined [employer]    Target Date 03/14/22            PLAN:  PT Frequency 2x / week   PT Duration 6 weeks   PT Treatment/Interventions ADLs/Self Care Home Management;Cryotherapy;Electrical Stimulation;Moist Heat;Gait training;Stair training;Functional mobility training;Therapeutic activities;Therapeutic exercise;Balance training;Neuromuscular re-education;Patient/family education;Manual techniques   PT Next Visit Plan Review HEP, ADLs, continue to progress core/lumbar/hip strengthening as tolerated.             Patient will benefit from skilled therapeutic intervention in order to improve the following deficits and impairments:  Decreased activity tolerance, Decreased endurance, Decreased range of motion, Decreased strength, Impaired sensation, Pain, Improper body mechanics, Decreased balance, Decreased mobility, Difficulty walking, Increased muscle spasms, Impaired flexibility, Postural dysfunction   Artist Pais, PTA 03/10/2022, 9:45 AM

## 2022-03-14 ENCOUNTER — Ambulatory Visit: Payer: Medicare Other | Admitting: Physical Therapy

## 2022-03-14 ENCOUNTER — Encounter: Payer: Self-pay | Admitting: Physical Therapy

## 2022-03-14 DIAGNOSIS — M5459 Other low back pain: Secondary | ICD-10-CM | POA: Diagnosis not present

## 2022-03-14 DIAGNOSIS — R2681 Unsteadiness on feet: Secondary | ICD-10-CM

## 2022-03-14 DIAGNOSIS — R262 Difficulty in walking, not elsewhere classified: Secondary | ICD-10-CM

## 2022-03-14 DIAGNOSIS — M6281 Muscle weakness (generalized): Secondary | ICD-10-CM

## 2022-03-14 NOTE — Therapy (Signed)
OUTPATIENT PHYSICAL THERAPY TREATMENT NOTE Rationale for Evaluation and Treatment Rehabilitation     PHYSICAL THERAPY DISCHARGE SUMMARY  Visits from Start of Care: 12  Current functional level related to goals / functional outcomes: Decreased LBP, improved functional ability, met 4/5 LTG, good progress towards remaining goal.  FOTO improved to 58% above predicted outcome.    Remaining deficits: Continues to have LBP, especially with prolonged walking.    Education / Equipment: HEP, education on Biomedical scientist.   Plan: Patient agrees to discharge.  Patient is being discharged due to meeting the stated rehab goals.        Patient Name: Cheryl Finley MRN: [id number] DOB:[birth year], 76 y.o., female Today's Date: 03/14/2022  PCP: Stacie Glaze, DO  REFERRING PROVIDER: Phylliss Bob, MD   PT End of Session - 03/14/22 0805     Visit Number 12    Number of Visits 12    Date for PT Re-Evaluation 03/14/22    Authorization Type [employer] Medicare    Progress Note Due on Visit 10    PT Start Time 0803    PT Stop Time 0846    PT Time Calculation (min) 43 min    Activity Tolerance Patient tolerated treatment well    Behavior During Therapy [facility] for tasks assessed/performed                Past Medical History:  Diagnosis Date   Age-related macular degeneration    Anemia    using iron daily   Arthritis    OA- knees, back, spine, hands    Asthma    Asthmatic bronchitis    Cancer ([place]) 2017   breast - pre-cancer   Complication of anesthesia    hard to wake    Diabetes mellitus without complication [facility]) [id number]   Dr. Nicholes Stairs- endocrinologist- with [facility]   Family history of adverse reaction to anesthesia    Mother of pt. hard to wake up    Heart murmur    Moderate Aortic Stenosis 08/16/21 echo (Dr. Elonda Husky, [place])   History of hiatal hernia    Hyperlipemia    Hypertension    Hypothyroidism    Peripheral vascular disease ([place]) 1981   DVT- bilateral  LE- postop- hysterectomy   Pneumonia    as a baby - double pneumonia - still has scarring in her lungs    Sleep apnea    CPAP- anytime she rests   Past Surgical History:  Procedure Laterality Date   ABDOMINAL HYSTERECTOMY     ANTERIOR LAT LUMBAR FUSION Left 02/25/2020   Procedure: LEFT-LATERAL INTERBODY FUSION LUMBAR 2 - LUMBAR 3 WITH INSTRUMENTATION AND ALLOGRAFT;  Surgeon: Phylliss Bob, MD;  Location: [place];  Service: Orthopedics;  Laterality: Left;   BREAST SURGERY Right 2017   pathology- precancer   CERVICAL SPINE SURGERY     CHOLECYSTECTOMY     DILATION AND CURETTAGE OF UTERUS     EYE SURGERY Bilateral    cataracts removed.    hystersalpingogram     KNEE CARTILAGE SURGERY Bilateral    2015, 2020   PARTIAL HYSTERECTOMY     SHOULDER ARTHROSCOPY WITH OPEN ROTATOR CUFF REPAIR Left    THYROIDECTOMY  unsure   TONSILLECTOMY     TRANSFORAMINAL LUMBAR INTERBODY FUSION (TLIF) WITH PEDICLE SCREW FIXATION 1 LEVEL Left 08/20/2019   Procedure: LEFT-SIDED LUMBAR 3-4 TRANSFORAMINAL LUMBAR INTERBODY FUSION WITH INSTRUMENTATION AND ALLOGRAFT;  Surgeon: Phylliss Bob, MD;  Location: [place];  Service: Orthopedics;  Laterality:  Left;   TRANSFORAMINAL LUMBAR INTERBODY FUSION (TLIF) WITH PEDICLE SCREW FIXATION 2 LEVEL N/A 10/13/2021   Procedure: THORACIC TWELVE- LUMBAR ONE, LUMBAR ONE - LUMBAR TWO DECOMPRESSION AND FUSION WITH INSTRUMENTATION AND ALLOGRAFT;  Surgeon: Phylliss Bob, MD;  Location: [place];  Service: Orthopedics;  Laterality: N/A;   Patient Active Problem List   Diagnosis Date Noted   Radiculopathy 08/20/2019    REFERRING DIAG: s/p T-12-L2 Fusion   THERAPY DIAG:  Other low back pain  Muscle weakness (generalized)  Difficulty in walking, not elsewhere classified  Unsteadiness on feet  PERTINENT HISTORY: HTN, T2DM, aortic valve stenosis, iron deficiency anemia, history of DVT, multiple back surgeries for stenosis  PRECAUTIONS: none  SUBJECTIVE: Pt is doing well.  Ready to  transition to water aerobics at [facility].   Did 3 laps yesterday at mall which was >1 mile.   PAIN:  Are you having pain? Yes: NPRS scale:  3/10 Pain location: R low back Pain description: sore, constant  OBJECTIVE: (objective measures completed at initial evaluation unless otherwise dated)  Sensation                                              01/31/2022 03/07/2022  Light Touch Appears Intact    Additional Comments reports numbness R hip (L1 dermatome)        Posture/Postural Control   Posture/Postural Control Postural limitations    Postural Limitations Rounded Shoulders;Forward head;Decreased lumbar lordosis        ROM / Strength   AROM / PROM / Strength AROM;Strength        AROM   Overall AROM  Deficits    AROM Assessment Site Lumbar    Lumbar Flexion to mid shin, flexing at hips    Lumbar Extension limited 50%    Lumbar - Right Side Bend to above knee   reports R LB ache   Lumbar - Left Side Bend unable   due to R LB tightness   Lumbar - Right Rotation limited 50%    Lumbar - Left Rotation limited 50%        Strength   Overall Strength Within functional limits for tasks performed    Overall Strength Comments tested in sitting bil LE all myotomes, L hip flexion 4+/5, all other 5/5        Ambulation/Gait   Ambulation/Gait Yes    Ambulation/Gait Assistance 7: Independent    Ambulation Distance (Feet) 600 Feet    Assistive device None    Gait Pattern Within Functional Limits        6 minute walk test results    Aerobic Endurance Distance Walked 600  925  Endurance additional comments dyspnea on exertion, needed sitting and standing rest breaks.  Dyspnea, 1 seated rest break      Standardized Balance Assessment   Standardized Balance Assessment Five Times Sit to Stand    Five times sit to stand comments  17.5 seconds without UE assist, reports increased ache in LB  11.4 seconds    Observation/Other Assessments                                  02/02/2022 03/07/22     Focus on  Therapeutic Outcomes (FOTO)  54 (predicted 56)  03/14/22- 58% above predicted outcome  Standardized Balance Assessment      Standardized Balance Assessment Dynamic Gait Index  Dynamic Gait Index            Dynamic Gait Index      Level Surface Mild Impairment  Normal     Change in Gait Speed Normal  Normal     Gait with Horizontal Head Turns Normal  Normal     Gait with Vertical Head Turns Mild Impairment  Normal     Gait and Pivot Turn Normal  Normal     Step Over Obstacle Normal  Normal     Step Around Obstacles Normal  Normal     Steps Mild Impairment  Mild Impairment     Total Score 21                           23     DGI comment: moderate fall risk       TODAY'S TREATMENT:  03/14/22 Therapeutic Exercise: to improve strength and mobility.  Demo, verbal and tactile cues throughout for technique. Nustep L5 x 6 min  Standing row with red TB 10x Standing shld extension with red TB 10x Paloff press red 10x bil Chops x 10 RTB bil Therapeutic Activities - review of body mechanics and lifting Ultrasound - to decrease muscle spasm and improve blood flow, 1MHz, 1.2 w/cm2, cont, to R lumbar paraspinals x 8 min   03/10/22 Therapeutic Exercise: Golfers lifts R/L x 10 each Standing row with red TB 10x Standing shld extension with red TB 10x Standing multifidus walkout red 10x Seated lunge with back arch (b UE raise to promote lumbar extension) 10x5" Standing extension at wall 10x3" Hooklying march with red TB and trA brace 10x Hooklying clam with red TB  10x2" B KTC stretch 2x30" B hamstring stretch in supine with strap x 30 sec  Modalities:  Moist heat to lumbar spine x 10 min  03/07/2022  Nustep L5 x 4 min - warm - up and subjective Physical Performance Test 6MWT -  925' - 1 seated rest break.  DGI 23/24 5xSTS Ultrasound - to decrease muscle spasm and improve blood flow, 1MHz, 1.2 w/cm2, cont, to R lumbar paraspinals x 8 min    PATIENT EDUCATION: Education details:  HEP and review, issued RTB, body mechanics and lifting.  Person educated: Patient Education method: Explanation, Demonstration, Verbal cues, and Handouts Education comprehension: verbalized understanding and returned demonstration   HOME EXERCISE PROGRAM:  [id number]  ASSESSMENT: CLINICAL IMPRESSION: Cheryl Finley has made good progress towards goals.  She reports being able to do more at home, walking a mile now (with seated rest breaks).  She still has LBP but understands that this is still healing and may improve with time, but important to continue to be active.  Reviewed and progressed HEP for core strengthening today, issued RTB, also reviewed body mechanics with lifting and ADLs to decrease strain on lumbar spine.  She is ready and agreeable to discharge today.  [place] at end of session to lumbar paraspinals to help with healing and to decrease pain.    PT Short Term Goals       PT SHORT TERM GOAL #1   Title Complete FOTO for lumbar spine    Status Achieved      PT SHORT TERM GOAL #2   Title Complete DGI to assess balance and safety    Status Achieved  PT Long Term Goals - 02/08/22 0806       PT LONG TERM GOAL #1   Title Ind with progressive HEP to improve outcomes/carryover    Time 6    Period Weeks    Status Achieved 03/07/22 met for current   Target Date 03/14/22      PT LONG TERM GOAL #2   Title Pt. will demonstrate improved endurance by being able to walk for 15 minutes without pain or dyspnea    Baseline 600'/4 min    Time 6    Period Weeks    Status On-going  03/07/22 925' in 6 minutes, 1 seated rest break needed, DOE   Target Date 03/14/22      PT LONG TERM GOAL #3   Title Pt. will demonstrate decreased fall risk by scoring at least 19/24 on DGI    Time 6    Period Weeks    Status Achieved - 23/24   Target Date 03/14/22      PT LONG TERM GOAL #4   Title Pt. will be able to complete household ADLs without increased LBP/discomfort with good  form.    Baseline limitations with doing household chores, picking items off floor    Time 6    Period Weeks    Status Achieved - 03/07/22 using golfer's lift, much easier    Target Date 03/14/22      PT LONG TERM GOAL #5   Title Pt. will demonstrate improved functional LE strength by completing 5x STS <14 seconds    Baseline 17.5 sec, reports increased ache in low back    Time 6    Period Weeks    Status Achieved - 02/17/22- see under today's treatment for specifics  03/07/22- 11.4 seconds   Target Date 03/14/22      PT LONG TERM GOAL #6   Title Patient will be confident transitioning to community based exercise program (silver sneakers/water aerobics) to maintain progress.    Time 6    Period Weeks    Status Achieved - 03/07/22 has joined [employer]    Target Date 03/14/22            PLAN:  PT Frequency 2x / week   PT Duration 6 weeks   PT Treatment/Interventions ADLs/Self Care Home Management;Cryotherapy;Electrical Stimulation;Moist Heat;Gait training;Stair training;Functional mobility training;Therapeutic activities;Therapeutic exercise;Balance training;Neuromuscular re-education;Patient/family education;Manual techniques   PT Next Visit Plan discharge            Patient will benefit from skilled therapeutic intervention in order to improve the following deficits and impairments:  Decreased activity tolerance, Decreased endurance, Decreased range of motion, Decreased strength, Impaired sensation, Pain, Improper body mechanics, Decreased balance, Decreased mobility, Difficulty walking, Increased muscle spasms, Impaired flexibility, Postural dysfunction   Rennie Natter, PT, DPT  03/14/2022, 9:57 AM
